# Patient Record
Sex: Female | Born: 1945 | Race: Black or African American | Hispanic: No | Smoking: Never smoker
Health system: Southern US, Community
[De-identification: ages and names within clinical notes are randomized; demographics above are authoritative.]

## PROBLEM LIST (undated history)

## (undated) DIAGNOSIS — K08109 Complete loss of teeth, unspecified cause, unspecified class: Secondary | ICD-10-CM

## (undated) DIAGNOSIS — E785 Hyperlipidemia, unspecified: Secondary | ICD-10-CM

## (undated) DIAGNOSIS — R29898 Other symptoms and signs involving the musculoskeletal system: Secondary | ICD-10-CM

## (undated) DIAGNOSIS — C50912 Malignant neoplasm of unspecified site of left female breast: Secondary | ICD-10-CM

## (undated) DIAGNOSIS — Z972 Presence of dental prosthetic device (complete) (partial): Secondary | ICD-10-CM

## (undated) DIAGNOSIS — G629 Polyneuropathy, unspecified: Secondary | ICD-10-CM

## (undated) DIAGNOSIS — E1142 Type 2 diabetes mellitus with diabetic polyneuropathy: Secondary | ICD-10-CM

## (undated) DIAGNOSIS — I10 Essential (primary) hypertension: Secondary | ICD-10-CM

## (undated) DIAGNOSIS — E119 Type 2 diabetes mellitus without complications: Secondary | ICD-10-CM

## (undated) HISTORY — DX: Polyneuropathy, unspecified: G62.9

## (undated) HISTORY — DX: Hyperlipidemia, unspecified: E78.5

## (undated) HISTORY — PX: FRACTURE SURGERY: SHX138

---

## 1969-09-27 HISTORY — PX: APPENDECTOMY: SHX54

## 2010-10-18 ENCOUNTER — Encounter: Payer: Self-pay | Admitting: Family Medicine

## 2012-02-04 ENCOUNTER — Emergency Department (INDEPENDENT_AMBULATORY_CARE_PROVIDER_SITE_OTHER)
Admission: EM | Admit: 2012-02-04 | Discharge: 2012-02-04 | Disposition: A | Discharge status: Home / Self Care | Payer: Self-pay | Source: Home / Self Care | Attending: Family Medicine | Admitting: Family Medicine

## 2012-02-04 ENCOUNTER — Encounter (HOSPITAL_COMMUNITY): Payer: Self-pay | Admitting: Emergency Medicine

## 2012-02-04 DIAGNOSIS — R739 Hyperglycemia, unspecified: Secondary | ICD-10-CM

## 2012-02-04 DIAGNOSIS — R7309 Other abnormal glucose: Secondary | ICD-10-CM

## 2012-02-04 HISTORY — DX: Essential (primary) hypertension: I10

## 2012-02-04 LAB — POCT I-STAT, CHEM 8
BUN: 16 mg/dL (ref 6–23)
Calcium, Ion: 1.19 mmol/L (ref 1.12–1.32)
TCO2: 25 mmol/L (ref 0–100)

## 2012-02-04 NOTE — ED Notes (Addendum)
Pt here with 2 mnths of intermit bilat posterior  Achy pain from thigh to lower legs.pt has hx htn and takes medication prescribed by prime care doctor but states since taking cholesterol med legs have been hurting.swelling seen in both feet.pt taking hctz.pt also reports of with standing unsteady gait due to pain needing wall to hold her up

## 2012-02-04 NOTE — ED Provider Notes (Signed)
History     CSN: [id number]  Arrival date & time 02/04/12  1308   First MD Initiated Contact with Patient 02/04/12 1018      Chief Complaint  Patient presents with  . Leg Pain    (Consider location/radiation/quality/duration/timing/severity/associated sxs/prior treatment) Patient is a 66 y.o. female presenting with leg pain. The history is provided by the patient.  Leg Pain  The incident occurred more than 1 week ago (cramping in legs since taking statin for cholesterol med for 1 mo.). The pain location is generalized. The pain is mild. Pertinent negatives include no muscle weakness. Treatments tried: has stopped med but sx continue, has had thyroid checked.    Past Medical History  Diagnosis Date  . Hypertension     History reviewed. No pertinent past surgical history.  No family history on file.  History  Substance Use Topics  . Smoking status: Never Smoker   . Smokeless tobacco: Not on file  . Alcohol Use: Yes    OB History    Grav Para Term Preterm Abortions TAB SAB Ect Mult Living                  Review of Systems  Constitutional: Negative.   Cardiovascular: Negative for leg swelling.  Musculoskeletal: Positive for gait problem. Negative for back pain and joint swelling.       No joint pain or swelling, sx are in muscles post bilat.  Hematological: Negative for adenopathy.    Allergies  Review of patient's allergies indicates no known allergies.  Home Medications   Current Outpatient Rx  Name Route Sig Dispense Refill  . LISINOPRIL-HYDROCHLOROTHIAZIDE 20-25 MG PO TABS Oral Take 1 tablet by mouth daily.    Marland Kitchen METOPROLOL SUCCINATE ER 200 MG PO TB24 Oral Take 200 mg by mouth daily.      BP 173/74  Pulse 64  Temp(Src) 98.4 F (36.9 C) (Oral)  Resp 20  SpO2 100%  Physical Exam  Nursing note and vitals reviewed. Constitutional: She is oriented to person, place, and time. She appears well-developed and well-nourished.  Musculoskeletal: Normal range  of motion. She exhibits tenderness. She exhibits no edema.       Feet:  Neurological: She is alert and oriented to person, place, and time.  Skin: Skin is warm and dry. No rash noted.    ED Course  Procedures (including critical care time)  Labs Reviewed  POCT I-STAT, CHEM 8 - Abnormal; Notable for the following:    Glucose, Bld 144 (*)    Hemoglobin 15.6 (*)    All other components within normal limits   No results found.   1. Hyperglycemia       MDM          Linna Hoff, MD 02/04/12 [phone]

## 2013-03-28 ENCOUNTER — Encounter: Payer: Self-pay | Admitting: Gastroenterology

## 2013-04-13 ENCOUNTER — Encounter: Payer: Self-pay | Admitting: Diagnostic Neuroimaging

## 2013-04-13 ENCOUNTER — Ambulatory Visit (INDEPENDENT_AMBULATORY_CARE_PROVIDER_SITE_OTHER): Payer: Medicare Other | Admitting: Diagnostic Neuroimaging

## 2013-04-13 VITALS — BP 160/70 | HR 88 | Temp 98.3°F | Ht 67.0 in | Wt 243.0 lb

## 2013-04-13 DIAGNOSIS — E1140 Type 2 diabetes mellitus with diabetic neuropathy, unspecified: Secondary | ICD-10-CM

## 2013-04-13 DIAGNOSIS — E1142 Type 2 diabetes mellitus with diabetic polyneuropathy: Secondary | ICD-10-CM

## 2013-04-13 DIAGNOSIS — M5416 Radiculopathy, lumbar region: Secondary | ICD-10-CM | POA: Insufficient documentation

## 2013-04-13 DIAGNOSIS — E538 Deficiency of other specified B group vitamins: Secondary | ICD-10-CM | POA: Insufficient documentation

## 2013-04-13 DIAGNOSIS — R269 Unspecified abnormalities of gait and mobility: Secondary | ICD-10-CM | POA: Insufficient documentation

## 2013-04-13 DIAGNOSIS — IMO0002 Reserved for concepts with insufficient information to code with codable children: Secondary | ICD-10-CM

## 2013-04-13 DIAGNOSIS — E1149 Type 2 diabetes mellitus with other diabetic neurological complication: Secondary | ICD-10-CM

## 2013-04-13 NOTE — Progress Notes (Signed)
[facility]  PATIENT: Tanya Juarez DOB: May 30, 1946  REFERRING CLINICIAN: Cloward HISTORY FROM: patient and son REASON FOR VISIT: new consult   HISTORICAL  CHIEF COMPLAINT:  Chief Complaint  Patient presents with  . Neurologic Problem    numbness    HISTORY OF PRESENT ILLNESS:   67 year old right-handed female with hypertension, diabetes, hypercholesterolemia, obesity, here for evaluation of gait and balance difficulty.  Last year, patient developed pain in the back of her thighs radiating to the bilateral feet. She also developed numbness in her toes. She had significant balance and walking difficulties. She had a significant problem getting up out of the chair. The symptoms have gradually improved.  At that time she was diagnosed with diabetes and B12 deficiency, and her numbers have been improving. Patient no longer has pain in her back of her thighs. She denies any low back pain. She's using a rolling walker with good results. She's planning to join the [facility] to get some exercise. She's planning to start physical therapy evaluation.  REVIEW OF SYSTEMS: Full 14 system review of systems performed and notable only for swelling and legs moles aching muscles anemia.  ALLERGIES: No Known Allergies  HOME MEDICATIONS: Outpatient Prescriptions Prior to Visit  Medication Sig Dispense Refill  . lisinopril-hydrochlorothiazide (PRINZIDE,ZESTORETIC) 20-25 MG per tablet Take 1 tablet by mouth daily.      . metoprolol (TOPROL-XL) 200 MG 24 hr tablet Take 100 mg by mouth daily.        No facility-administered medications prior to visit.    PAST MEDICAL HISTORY: Past Medical History  Diagnosis Date  . Hypertension     PAST SURGICAL HISTORY: Past Surgical History  Procedure Laterality Date  . Cesarean section      FAMILY HISTORY: Family History  Problem Relation Age of Onset  . Cancer Mother   . Lung cancer Father   . Hypertension      SOCIAL  HISTORY:  History   Social History  . Marital Status: Single    Spouse Name: N/A    Number of Children: 2  . Years of Education: 12th   Occupational History  . Not on file.   Social History Main Topics  . Smoking status: Never Smoker   . Smokeless tobacco: Never Used  . Alcohol Use: Yes     Comment: occasionally  . Drug Use: No  . Sexually Active: Not on file   Other Topics Concern  . Not on file   Social History Narrative   Patient lives at home alone.   Caffeine Use: 1 cup occasionally     PHYSICAL EXAM  Filed Vitals:   04/13/13 0925  BP: 160/70  Pulse: 88  Temp: 98.3 F (36.8 C)  TempSrc: Oral  Height: 5\' 7"  (1.702 m)  Weight: 243 lb (110.224 kg)    Not recorded    Body mass index is 38.05 kg/(m^2).  GENERAL EXAM: Patient is in no distress  CARDIOVASCULAR: Regular rate and rhythm, no murmurs, no carotid bruits  NEUROLOGIC: MENTAL STATUS: awake, alert, language fluent, comprehension intact, naming intact CRANIAL NERVE: no papilledema on fundoscopic exam, pupils equal and reactive to light, visual fields full to confrontation, extraocular muscles intact, no nystagmus, facial sensation and strength symmetric, uvula midline, shoulder shrug symmetric, tongue midline. MOTOR: normal bulk and tone, full strength in the BUE; BLE (R HF 4, L HF 3, KNE/KF 5, DF 5, PF 3). SENSORY: ABSENT VIB IN LEFT TOE AND ANKLE. DECR PP IN LEFT FOOT. COORDINATION:  finger-nose-finger, fine finger movements normal REFLEXES: BUE 1, KNEES TRACE, ANKLES 0. GAIT/STATION: SLOW CAUTIOUS GAIT. DIFF ARISING FROM CHAIR. CANNOT STAND UP ON TOES. ABLE TO GET ON HEELS, BUT NOT WALK. CANNOT TANDEM. ROMBERG POSITIVE.  DIAGNOSTIC DATA (LABS, IMAGING, TESTING) - I reviewed patient records, labs, notes, testing and imaging myself where available.  Lab Results  Component Value Date   HGB 15.6* 02/04/2012   HCT 46.0 02/04/2012      Component Value Date/Time   NA 140 02/04/2012 1125   K 4.1  02/04/2012 1125   CL 106 02/04/2012 1125   GLUCOSE 144* 02/04/2012 1125   BUN 16 02/04/2012 1125   CREATININE 0.90 02/04/2012 1125   No results found for this basename: CHOL, HDL, LDLCALC, LDLDIRECT, TRIG, CHOLHDL   No results found for this basename: HGBA1C   No results found for this basename: VITAMINB12   No results found for this basename: TSH    A1C - 6.4  B12 - "> 1000" per patient and son  ASSESSMENT AND PLAN  18 y.o. year old female  has a past medical history of Hypertension. here with gait and balance difficulty for over one year. Symptoms are gradually improving. 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  PLAN: 1. Since her symptoms are gradually improving, I will defer any further testing. I agree with continued management of diabetes, B12 deficiency and referral to physical therapy. 2. If symptoms get worse in the future, I would check CK, aldolase, MRI lumbar spine, and possible EMG nerve conduction study.    Suanne Marker, MD 04/13/2013, 9:52 AM Certified in Neurology, Neurophysiology and Neuroimaging  [facility] [address] [place], [place] [address] [phone]

## 2013-04-13 NOTE — Patient Instructions (Signed)
Continue with Physical therapy.

## 2013-05-30 ENCOUNTER — Other Ambulatory Visit: Payer: Self-pay | Admitting: Internal Medicine

## 2013-05-30 DIAGNOSIS — Z1231 Encounter for screening mammogram for malignant neoplasm of breast: Secondary | ICD-10-CM

## 2013-06-05 ENCOUNTER — Encounter: Payer: Self-pay | Admitting: Gastroenterology

## 2015-08-18 ENCOUNTER — Inpatient Hospital Stay (HOSPITAL_COMMUNITY)
Admission: EM | Admit: 2015-08-18 | Discharge: 2015-08-23 | DRG: 493 | Disposition: A | Discharge status: Skilled Nursing Facility | Payer: Medicare Other | Attending: Orthopaedic Surgery | Admitting: Orthopaedic Surgery

## 2015-08-18 ENCOUNTER — Emergency Department (HOSPITAL_COMMUNITY): Payer: Medicare Other

## 2015-08-18 ENCOUNTER — Encounter (HOSPITAL_COMMUNITY): Payer: Self-pay | Admitting: Emergency Medicine

## 2015-08-18 DIAGNOSIS — I10 Essential (primary) hypertension: Secondary | ICD-10-CM | POA: Diagnosis not present

## 2015-08-18 DIAGNOSIS — S82842B Displaced bimalleolar fracture of left lower leg, initial encounter for open fracture type I or II: Principal | ICD-10-CM | POA: Diagnosis present

## 2015-08-18 DIAGNOSIS — W19XXXA Unspecified fall, initial encounter: Secondary | ICD-10-CM | POA: Diagnosis present

## 2015-08-18 DIAGNOSIS — Z7982 Long term (current) use of aspirin: Secondary | ICD-10-CM

## 2015-08-18 DIAGNOSIS — Z7984 Long term (current) use of oral hypoglycemic drugs: Secondary | ICD-10-CM | POA: Diagnosis not present

## 2015-08-18 DIAGNOSIS — E119 Type 2 diabetes mellitus without complications: Secondary | ICD-10-CM | POA: Diagnosis not present

## 2015-08-18 DIAGNOSIS — D62 Acute posthemorrhagic anemia: Secondary | ICD-10-CM | POA: Diagnosis not present

## 2015-08-18 DIAGNOSIS — Z79899 Other long term (current) drug therapy: Secondary | ICD-10-CM

## 2015-08-18 DIAGNOSIS — S82899A Other fracture of unspecified lower leg, initial encounter for closed fracture: Secondary | ICD-10-CM

## 2015-08-18 DIAGNOSIS — Z8249 Family history of ischemic heart disease and other diseases of the circulatory system: Secondary | ICD-10-CM | POA: Diagnosis not present

## 2015-08-18 DIAGNOSIS — Z419 Encounter for procedure for purposes other than remedying health state, unspecified: Secondary | ICD-10-CM

## 2015-08-18 DIAGNOSIS — Z6837 Body mass index (BMI) 37.0-37.9, adult: Secondary | ICD-10-CM

## 2015-08-18 DIAGNOSIS — T1490 Injury, unspecified: Secondary | ICD-10-CM | POA: Diagnosis not present

## 2015-08-18 DIAGNOSIS — T1490XA Injury, unspecified, initial encounter: Secondary | ICD-10-CM

## 2015-08-18 DIAGNOSIS — S82892B Other fracture of left lower leg, initial encounter for open fracture type I or II: Secondary | ICD-10-CM

## 2015-08-18 DIAGNOSIS — T148XXA Other injury of unspecified body region, initial encounter: Secondary | ICD-10-CM

## 2015-08-18 MED ORDER — TETANUS-DIPHTH-ACELL PERTUSSIS 5-2.5-18.5 LF-MCG/0.5 IM SUSP
0.5000 mL | Freq: Once | INTRAMUSCULAR | Status: AC
  Administered 2015-08-18: 0.5 mL via INTRAMUSCULAR
  Filled 2015-08-18: qty 0.5

## 2015-08-18 MED ORDER — CEFAZOLIN SODIUM 1-5 GM-% IV SOLN
1.0000 g | Freq: Once | INTRAVENOUS | Status: AC
  Administered 2015-08-18: 1 g via INTRAVENOUS
  Filled 2015-08-18: qty 50

## 2015-08-18 MED ORDER — SODIUM CHLORIDE 0.9 % IV BOLUS (SEPSIS)
1000.0000 mL | Freq: Once | INTRAVENOUS | Status: AC
Start: 2015-08-18 — End: 2015-08-19
  Administered 2015-08-18: 1000 mL via INTRAVENOUS

## 2015-08-18 NOTE — ED Notes (Signed)
Pt to xray

## 2015-08-18 NOTE — ED Notes (Signed)
Pt back from x-ray.

## 2015-08-18 NOTE — ED Notes (Signed)
Pt in EMS from home, was walking in kitchen and leg gave out, has open tib/fib on L side. Bleeding controlled, peripheral intact

## 2015-08-18 NOTE — ED Provider Notes (Signed)
CSN: [id number]     Arrival date & time 08/18/15  2226 History   First MD Initiated Contact with Patient 08/18/15 2230     Chief Complaint  Patient presents with  . Leg Injury     (Consider location/radiation/quality/duration/timing/severity/associated sxs/prior Treatment) Patient is a 69 y.o. female presenting with ankle pain.  Ankle Pain Location:  Ankle Injury: yes   Ankle location:  L ankle Pain details:    Quality:  Aching   Radiates to:  Does not radiate   Severity:  No pain   Onset quality:  Sudden   Duration:  3 hours   Timing:  Constant Associated symptoms: no back pain and no fever     Past Medical History  Diagnosis Date  . Hypertension   . Diabetes mellitus without complication [facility])    Past Surgical History  Procedure Laterality Date  . Cesarean section     Family History  Problem Relation Age of Onset  . Cancer Mother   . Lung cancer Father   . Hypertension     Social History  Substance Use Topics  . Smoking status: Never Smoker   . Smokeless tobacco: Never Used  . Alcohol Use: Yes     Comment: occasionally   OB History    No data available     Review of Systems  Constitutional: Negative for fever and chills.  Eyes: Negative for pain and redness.  Respiratory: Negative for cough and stridor.   Cardiovascular: Negative for chest pain and palpitations.  Gastrointestinal: Negative for abdominal pain.  Endocrine: Negative for polydipsia and polyuria.  Genitourinary: Negative for flank pain.  Musculoskeletal: Positive for gait problem (left ankle pain and deformity). Negative for myalgias and back pain.  All other systems reviewed and are negative.     Allergies  Review of patient's allergies indicates no known allergies.  Home Medications   Prior to Admission medications   Medication Sig Start Date End Date Taking? Authorizing Provider  aspirin 81 MG tablet Take 81 mg by mouth daily.   Yes Historical Provider, MD  Cholecalciferol  (VITAMIN D3) 1000 UNITS CAPS Take 1,000 Units by mouth daily.    Yes Historical Provider, MD  cloNIDine (CATAPRES) 0.2 MG tablet Take 0.2 mg by mouth 2 (two) times daily.   Yes Historical Provider, MD  lisinopril-hydrochlorothiazide (PRINZIDE,ZESTORETIC) 20-25 MG per tablet Take 1 tablet by mouth daily.   Yes Historical Provider, MD  metFORMIN (GLUCOPHAGE-XR) 500 MG 24 hr tablet Take 1 tablet by mouth daily. 03/23/13  Yes Historical Provider, MD  pravastatin (PRAVACHOL) 20 MG tablet Take 1 tablet by mouth daily. 03/23/13  Yes Historical Provider, MD  vitamin B-12 (CYANOCOBALAMIN) 1000 MCG tablet Take 1,000 mcg by mouth daily.   Yes Historical Provider, MD   BP 151/82 mmHg  Pulse 71  Temp(Src) 97.8 F (36.6 C) (Oral)  Resp 27  Ht 5\' 7"  (1.702 m)  Wt 240 lb (108.863 kg)  BMI 37.58 kg/m2  SpO2 95% Physical Exam  Constitutional: She is oriented to person, place, and time. She appears well-developed and well-nourished.  HENT:  Head: Normocephalic and atraumatic.  Eyes: Conjunctivae and EOM are normal. Pupils are equal, round, and reactive to light.  Neck: Normal range of motion.  Cardiovascular: Normal rate and regular rhythm.   Pulmonary/Chest: Effort normal and breath sounds normal. No stridor. No respiratory distress.  Abdominal: Soft. She exhibits no distension. There is no tenderness.  Musculoskeletal: Normal range of motion. She exhibits tenderness (slightly to left ankle).  She exhibits no edema.  1.5 cm laceration to medial side of obviously deformed left ankle with a pulse initially, disappeared during ED stay. Delayed cap refill as well after pulse disappeared.   Neurological: She is alert and oriented to person, place, and time.  Skin: Skin is warm and dry.  Nursing note and vitals reviewed.   ED Course  Reduction of fracture Date/Time: 08/19/2015 12:30 AM Performed by: Merrily Pew Authorized by: Merrily Pew Consent: Verbal consent obtained. Risks and benefits: risks,  benefits and alternatives were discussed Consent given by: patient Local anesthesia used: no Patient sedated: no Patient tolerance: Patient tolerated the procedure well with no immediate complications Comments: Attempted reduction 2/2 pulseless left foot with delayed capillary refill and was successful in anatomic alignment but still pulseless. Neurologically intact.    (including critical care time)  CRITICAL CARE Performed by: Merrily Pew   Total critical care time: 35 minutes  Critical care time was exclusive of separately billable procedures and treating other patients.  Critical care was necessary to treat or prevent imminent or life-threatening deterioration.  Critical care was time spent personally by me on the following activities: development of treatment plan with patient and/or surrogate as well as nursing, discussions with consultants, evaluation of patient's response to treatment, examination of patient, obtaining history from patient or surrogate, ordering and performing treatments and interventions, ordering and review of laboratory studies, ordering and review of radiographic studies, pulse oximetry and re-evaluation of patient's condition.   Labs Review Labs Reviewed  CBC WITH DIFFERENTIAL/PLATELET - Abnormal; Notable for the following:    WBC 18.9 (*)    Neutro Abs 16.1 (*)    Monocytes Absolute 1.2 (*)    All other components within normal limits  COMPREHENSIVE METABOLIC PANEL  PROTIME-INR    Imaging Review Dg Ankle Complete Left  08/18/2015  CLINICAL DATA:  Fall with ankle fracture. EXAM: LEFT ANKLE COMPLETE - 3+ VIEW COMPARISON:  None. FINDINGS: Oblique fracture left lateral malleolus. Transverse fracture of the medial malleolar tip. Abnormal widening of the mortise if, which measures up to 10 mm craniocaudad medially and up to 12 mm between the medial malleolus and the talus. The shaft of the fibula and the shaft of the tibia are both displaced medially. I  do not see a distinct fracture of the posterior malleolus. Vascular calcifications noted.  Large plantar calcaneal spur. Severe soft tissue swelling noted. Spurring in the midfoot is present. IMPRESSION: 1. Prominent Weber B fracture with oblique lateral malleolar component, transverse medial malleolar tip component, and medial displacement of the tibia and fibular shaft with respect to the talus. 2. Midfoot degenerative spurring. 3. Considerable vascular calcifications. 4. Considerable soft tissue swelling. Electronically Signed   By: Van Clines M.D.   On: 08/18/2015 23:34   Dg Knee Complete 4 Views Left  08/18/2015  CLINICAL DATA:  Left knee bruising after crawling to another room. Initial encounter. EXAM: LEFT KNEE - COMPLETE 4+ VIEW COMPARISON:  None. FINDINGS: There is no evidence of fracture or dislocation. The joint spaces are preserved. No significant degenerative change is seen; the patellofemoral joint is grossly unremarkable in appearance. No significant joint effusion is seen. Diffuse vascular calcifications are seen. IMPRESSION: 1. No evidence of fracture or dislocation. 2. Diffuse vascular calcifications seen. Electronically Signed   By: Garald Balding M.D.   On: 08/18/2015 23:33   I have personally reviewed and evaluated these images and lab results as part of my medical decision-making.   EKG Interpretation None  MDM   Final diagnoses:  Ankle fracture  Open left ankle fracture, type I or II, initial encounter   69 year old female with a left ankle fracture from standing. It also became open when she tried standing up to let her son in the door. On arrival here she she had a pulse in her left foot but was diminished. Sometime during her stay she lost her pulse and her foot had delayed cap Refill. I attempted manipulation of the joint without any improvement. She also had a laceration to the medial side of her left ankle that was hemostatic. Ancef and tDAP given.  Discussed case with orthopedics and they recommended a CT angiogram as a vascular injury is unlikely with this injury. Will come to see and post for OR.      Merrily Pew, MD 08/21/15 [phone]

## 2015-08-19 ENCOUNTER — Emergency Department (HOSPITAL_COMMUNITY): Payer: Medicare Other | Admitting: Anesthesiology

## 2015-08-19 ENCOUNTER — Emergency Department (HOSPITAL_COMMUNITY): Payer: Medicare Other

## 2015-08-19 ENCOUNTER — Encounter (HOSPITAL_COMMUNITY): Payer: Self-pay

## 2015-08-19 ENCOUNTER — Encounter (HOSPITAL_COMMUNITY)
Admission: EM | Disposition: A | Discharge status: Skilled Nursing Facility | Payer: Self-pay | Source: Home / Self Care | Attending: Orthopaedic Surgery

## 2015-08-19 DIAGNOSIS — S82892B Other fracture of left lower leg, initial encounter for open fracture type I or II: Secondary | ICD-10-CM

## 2015-08-19 DIAGNOSIS — Z7982 Long term (current) use of aspirin: Secondary | ICD-10-CM | POA: Diagnosis not present

## 2015-08-19 DIAGNOSIS — W19XXXA Unspecified fall, initial encounter: Secondary | ICD-10-CM | POA: Diagnosis present

## 2015-08-19 DIAGNOSIS — T148XXA Other injury of unspecified body region, initial encounter: Secondary | ICD-10-CM

## 2015-08-19 DIAGNOSIS — Z8249 Family history of ischemic heart disease and other diseases of the circulatory system: Secondary | ICD-10-CM | POA: Diagnosis not present

## 2015-08-19 DIAGNOSIS — Z7984 Long term (current) use of oral hypoglycemic drugs: Secondary | ICD-10-CM | POA: Diagnosis not present

## 2015-08-19 DIAGNOSIS — I10 Essential (primary) hypertension: Secondary | ICD-10-CM | POA: Diagnosis present

## 2015-08-19 DIAGNOSIS — Z6837 Body mass index (BMI) 37.0-37.9, adult: Secondary | ICD-10-CM | POA: Diagnosis not present

## 2015-08-19 DIAGNOSIS — S82842B Displaced bimalleolar fracture of left lower leg, initial encounter for open fracture type I or II: Secondary | ICD-10-CM | POA: Diagnosis present

## 2015-08-19 DIAGNOSIS — E119 Type 2 diabetes mellitus without complications: Secondary | ICD-10-CM | POA: Diagnosis present

## 2015-08-19 DIAGNOSIS — Z79899 Other long term (current) drug therapy: Secondary | ICD-10-CM | POA: Diagnosis not present

## 2015-08-19 DIAGNOSIS — T1490 Injury, unspecified: Secondary | ICD-10-CM | POA: Diagnosis present

## 2015-08-19 DIAGNOSIS — D62 Acute posthemorrhagic anemia: Secondary | ICD-10-CM | POA: Diagnosis not present

## 2015-08-19 HISTORY — PX: I&D EXTREMITY: SHX5045

## 2015-08-19 LAB — CBC WITH DIFFERENTIAL/PLATELET
Basophils Absolute: 0 10*3/uL (ref 0.0–0.1)
Basophils Relative: 0 %
Eosinophils Absolute: 0 10*3/uL (ref 0.0–0.7)
Eosinophils Relative: 0 %
HEMATOCRIT: 38.7 % (ref 36.0–46.0)
HEMOGLOBIN: 13.1 g/dL (ref 12.0–15.0)
LYMPHS ABS: 1.5 10*3/uL (ref 0.7–4.0)
LYMPHS PCT: 8 %
MCH: 31.6 pg (ref 26.0–34.0)
MCHC: 33.9 g/dL (ref 30.0–36.0)
MCV: 93.5 fL (ref 78.0–100.0)
MONO ABS: 1.2 10*3/uL — AB (ref 0.1–1.0)
MONOS PCT: 6 %
NEUTROS ABS: 16.1 10*3/uL — AB (ref 1.7–7.7)
NEUTROS PCT: 86 %
Platelets: 289 10*3/uL (ref 150–400)
RBC: 4.14 MIL/uL (ref 3.87–5.11)
RDW: 13.5 % (ref 11.5–15.5)
WBC: 18.9 10*3/uL — ABNORMAL HIGH (ref 4.0–10.5)

## 2015-08-19 LAB — COMPREHENSIVE METABOLIC PANEL
ALBUMIN: 3.3 g/dL — AB (ref 3.5–5.0)
ALK PHOS: 42 U/L (ref 38–126)
ALT: 30 U/L (ref 14–54)
AST: 39 U/L (ref 15–41)
Anion gap: 12 (ref 5–15)
BILIRUBIN TOTAL: 1.1 mg/dL (ref 0.3–1.2)
BUN: 10 mg/dL (ref 6–20)
CHLORIDE: 96 mmol/L — AB (ref 101–111)
CO2: 19 mmol/L — AB (ref 22–32)
CREATININE: 0.9 mg/dL (ref 0.44–1.00)
Calcium: 8.7 mg/dL — ABNORMAL LOW (ref 8.9–10.3)
GFR calc Af Amer: >60 mL/min (ref >60)
Glucose, Bld: 130 mg/dL — ABNORMAL HIGH (ref 65–99)
POTASSIUM: 4.3 mmol/L (ref 3.5–5.1)
Sodium: 127 mmol/L — ABNORMAL LOW (ref 135–145)
Total Protein: 7.2 g/dL (ref 6.5–8.1)

## 2015-08-19 LAB — GLUCOSE, CAPILLARY
GLUCOSE-CAPILLARY: 109 mg/dL — AB (ref 65–99)
GLUCOSE-CAPILLARY: 136 mg/dL — AB (ref 65–99)
Glucose-Capillary: 105 mg/dL — ABNORMAL HIGH (ref 65–99)
Glucose-Capillary: 107 mg/dL — ABNORMAL HIGH (ref 65–99)
Glucose-Capillary: 106 mg/dL — ABNORMAL HIGH (ref 65–99)
Glucose-Capillary: 127 mg/dL — ABNORMAL HIGH (ref 65–99)

## 2015-08-19 LAB — PROTIME-INR
INR: 1.05 (ref 0.00–1.49)
Prothrombin Time: 13.9 seconds (ref 11.6–15.2)

## 2015-08-19 SURGERY — IRRIGATION AND DEBRIDEMENT EXTREMITY
Anesthesia: General | Site: Ankle | Laterality: Left

## 2015-08-19 MED ORDER — FENTANYL CITRATE (PF) 250 MCG/5ML IJ SOLN
INTRAMUSCULAR | Status: AC
Start: 2015-08-19 — End: 2015-08-19
  Filled 2015-08-19: qty 5

## 2015-08-19 MED ORDER — OXYCODONE HCL 5 MG PO TABS
5.0000 mg | ORAL_TABLET | ORAL | Status: DC | PRN
  Administered 2015-08-19 – 2015-08-22 (×7): 10 mg via ORAL
  Filled 2015-08-19 (×7): qty 2

## 2015-08-19 MED ORDER — HYDROCODONE-ACETAMINOPHEN 7.5-325 MG PO TABS: 1.0000 | ORAL_TABLET | Freq: Once | ORAL | Status: DC | PRN

## 2015-08-19 MED ORDER — PRAVASTATIN SODIUM 20 MG PO TABS
20.0000 mg | ORAL_TABLET | Freq: Every day | ORAL | Status: DC
  Administered 2015-08-19 – 2015-08-23 (×4): 20 mg via ORAL
  Filled 2015-08-19 (×5): qty 1

## 2015-08-19 MED ORDER — CLONIDINE HCL 0.2 MG PO TABS
0.2000 mg | ORAL_TABLET | Freq: Two times a day (BID) | ORAL | Status: DC
  Administered 2015-08-19 – 2015-08-23 (×8): 0.2 mg via ORAL
  Filled 2015-08-19 (×9): qty 1

## 2015-08-19 MED ORDER — CEFAZOLIN SODIUM-DEXTROSE 2-3 GM-% IV SOLR
2.0000 g | Freq: Four times a day (QID) | INTRAVENOUS | Status: AC
  Administered 2015-08-19 (×3): 2 g via INTRAVENOUS
  Filled 2015-08-19 (×4): qty 50

## 2015-08-19 MED ORDER — LIDOCAINE HCL (CARDIAC) 20 MG/ML IV SOLN
INTRAVENOUS | Status: AC
  Filled 2015-08-19: qty 5

## 2015-08-19 MED ORDER — METOCLOPRAMIDE HCL 5 MG/ML IJ SOLN: 5.0000 mg | Freq: Three times a day (TID) | INTRAMUSCULAR | Status: DC | PRN

## 2015-08-19 MED ORDER — HYDROMORPHONE HCL 1 MG/ML IJ SOLN: 0.2500 mg | INTRAMUSCULAR | Status: DC | PRN

## 2015-08-19 MED ORDER — SUCCINYLCHOLINE CHLORIDE 20 MG/ML IJ SOLN
INTRAMUSCULAR | Status: AC
  Filled 2015-08-19: qty 1

## 2015-08-19 MED ORDER — ONDANSETRON HCL 4 MG PO TABS: 4.0000 mg | ORAL_TABLET | Freq: Four times a day (QID) | ORAL | Status: DC | PRN

## 2015-08-19 MED ORDER — ACETAMINOPHEN 650 MG RE SUPP: 650.0000 mg | Freq: Four times a day (QID) | RECTAL | Status: DC | PRN

## 2015-08-19 MED ORDER — IOHEXOL 350 MG/ML SOLN
100.0000 mL | Freq: Once | INTRAVENOUS | Status: AC | PRN
Start: 2015-08-19 — End: 2015-08-19
  Administered 2015-08-19: 100 mL via INTRAVENOUS

## 2015-08-19 MED ORDER — INSULIN ASPART 100 UNIT/ML ~~LOC~~ SOLN
0.0000 [IU] | Freq: Three times a day (TID) | SUBCUTANEOUS | Status: DC
  Administered 2015-08-19: 2 [IU] via SUBCUTANEOUS

## 2015-08-19 MED ORDER — CEFAZOLIN SODIUM-DEXTROSE 2-3 GM-% IV SOLR
2.0000 g | Freq: Once | INTRAVENOUS | Status: AC
  Administered 2015-08-20: 2 g via INTRAVENOUS
  Filled 2015-08-19: qty 50

## 2015-08-19 MED ORDER — METOCLOPRAMIDE HCL 5 MG PO TABS: 5.0000 mg | ORAL_TABLET | Freq: Three times a day (TID) | ORAL | Status: DC | PRN

## 2015-08-19 MED ORDER — LIDOCAINE HCL (CARDIAC) 20 MG/ML IV SOLN
INTRAVENOUS | Status: DC | PRN
  Administered 2015-08-19: 60 mg via INTRAVENOUS

## 2015-08-19 MED ORDER — SODIUM CHLORIDE 0.9 % IV SOLN
INTRAVENOUS | Status: DC | PRN
  Administered 2015-08-19 (×2): via INTRAVENOUS

## 2015-08-19 MED ORDER — INSULIN ASPART 100 UNIT/ML ~~LOC~~ SOLN: 0.0000 [IU] | Freq: Every day | SUBCUTANEOUS | Status: DC

## 2015-08-19 MED ORDER — PROPOFOL 10 MG/ML IV BOLUS
INTRAVENOUS | Status: AC
  Filled 2015-08-19: qty 20

## 2015-08-19 MED ORDER — PHENYLEPHRINE HCL 10 MG/ML IJ SOLN
INTRAMUSCULAR | Status: DC | PRN
  Administered 2015-08-19 (×2): 80 ug via INTRAVENOUS

## 2015-08-19 MED ORDER — FENTANYL CITRATE (PF) 250 MCG/5ML IJ SOLN
INTRAMUSCULAR | Status: DC | PRN
  Administered 2015-08-19 (×2): 50 ug via INTRAVENOUS

## 2015-08-19 MED ORDER — ONDANSETRON HCL 4 MG/2ML IJ SOLN
4.0000 mg | Freq: Four times a day (QID) | INTRAMUSCULAR | Status: DC | PRN
  Administered 2015-08-20: 4 mg via INTRAVENOUS

## 2015-08-19 MED ORDER — CEFAZOLIN SODIUM-DEXTROSE 2-3 GM-% IV SOLR
INTRAVENOUS | Status: DC | PRN
Start: 2015-08-19 — End: 2015-08-19
  Administered 2015-08-19: 2 g via INTRAVENOUS

## 2015-08-19 MED ORDER — HYDROCHLOROTHIAZIDE 25 MG PO TABS
25.0000 mg | ORAL_TABLET | Freq: Every day | ORAL | Status: DC
  Administered 2015-08-19 – 2015-08-23 (×4): 25 mg via ORAL
  Filled 2015-08-19 (×5): qty 1

## 2015-08-19 MED ORDER — SUCCINYLCHOLINE CHLORIDE 20 MG/ML IJ SOLN
INTRAMUSCULAR | Status: DC | PRN
  Administered 2015-08-19: 100 mg via INTRAVENOUS

## 2015-08-19 MED ORDER — LISINOPRIL 20 MG PO TABS
20.0000 mg | ORAL_TABLET | Freq: Every day | ORAL | Status: DC
  Administered 2015-08-19 – 2015-08-23 (×4): 20 mg via ORAL
  Filled 2015-08-19 (×5): qty 1

## 2015-08-19 MED ORDER — MIDAZOLAM HCL 2 MG/2ML IJ SOLN
INTRAMUSCULAR | Status: AC
  Filled 2015-08-19: qty 2

## 2015-08-19 MED ORDER — PROMETHAZINE HCL 25 MG/ML IJ SOLN: 6.2500 mg | INTRAMUSCULAR | Status: DC | PRN

## 2015-08-19 MED ORDER — SODIUM CHLORIDE 0.9 % IV SOLN
INTRAVENOUS | Status: DC
  Administered 2015-08-19 – 2015-08-20 (×2): via INTRAVENOUS

## 2015-08-19 MED ORDER — LISINOPRIL-HYDROCHLOROTHIAZIDE 20-25 MG PO TABS: 1.0000 | ORAL_TABLET | Freq: Every day | ORAL | Status: DC

## 2015-08-19 MED ORDER — DIPHENHYDRAMINE HCL 12.5 MG/5ML PO ELIX: 25.0000 mg | ORAL_SOLUTION | ORAL | Status: DC | PRN

## 2015-08-19 MED ORDER — PROPOFOL 10 MG/ML IV BOLUS
INTRAVENOUS | Status: DC | PRN
  Administered 2015-08-19: 150 mg via INTRAVENOUS

## 2015-08-19 MED ORDER — ACETAMINOPHEN 325 MG PO TABS: 650.0000 mg | ORAL_TABLET | Freq: Four times a day (QID) | ORAL | Status: DC | PRN

## 2015-08-19 SURGICAL SUPPLY — 42 items
BANDAGE ELASTIC 6 VELCRO ST LF (GAUZE/BANDAGES/DRESSINGS) ×3 IMPLANT
BLADE SURG 10 STRL SS (BLADE) ×3 IMPLANT
BNDG GAUZE ELAST 4 BULKY (GAUZE/BANDAGES/DRESSINGS) ×3 IMPLANT
CORDS BIPOLAR (ELECTRODE) IMPLANT
COVER SURGICAL LIGHT HANDLE (MISCELLANEOUS) ×3 IMPLANT
CUFF TOURNIQUET SINGLE 24IN (TOURNIQUET CUFF) IMPLANT
CUFF TOURNIQUET SINGLE 34IN LL (TOURNIQUET CUFF) ×6 IMPLANT
CUFF TOURNIQUET SINGLE 44IN (TOURNIQUET CUFF) IMPLANT
DRAPE IMP U-DRAPE 54X76 (DRAPES) IMPLANT
DRAPE SURG 17X23 STRL (DRAPES) IMPLANT
DRAPE U-SHAPE 47X51 STRL (DRAPES) ×3 IMPLANT
ELECT CAUTERY BLADE 6.4 (BLADE) ×3 IMPLANT
ELECT REM PT RETURN 9FT ADLT (ELECTROSURGICAL)
ELECTRODE REM PT RTRN 9FT ADLT (ELECTROSURGICAL) IMPLANT
FACESHIELD WRAPAROUND (MASK) ×3 IMPLANT
FACESHIELD WRAPAROUND OR TEAM (MASK) IMPLANT
GAUZE SPONGE 4X4 12PLY STRL (GAUZE/BANDAGES/DRESSINGS) ×6 IMPLANT
GAUZE XEROFORM 1X8 LF (GAUZE/BANDAGES/DRESSINGS) ×3 IMPLANT
GLOVE NEODERM STRL 7.5 LF PF (GLOVE) ×1 IMPLANT
GLOVE SURG NEODERM 7.5  LF PF (GLOVE) ×2
GOWN STRL REIN XL XLG (GOWN DISPOSABLE) ×3 IMPLANT
KIT BASIN OR (CUSTOM PROCEDURE TRAY) ×3 IMPLANT
KIT ROOM TURNOVER OR (KITS) ×3 IMPLANT
MANIFOLD NEPTUNE II (INSTRUMENTS) ×3 IMPLANT
NS IRRIG 1000ML POUR BTL (IV SOLUTION) ×6 IMPLANT
PACK ORTHO EXTREMITY (CUSTOM PROCEDURE TRAY) ×3 IMPLANT
PAD ABD 8X10 STRL (GAUZE/BANDAGES/DRESSINGS) ×3 IMPLANT
PAD ARMBOARD 7.5X6 YLW CONV (MISCELLANEOUS) ×6 IMPLANT
PADDING CAST ABS 4INX4YD NS (CAST SUPPLIES) ×4
PADDING CAST ABS COTTON 4X4 ST (CAST SUPPLIES) ×2 IMPLANT
PADDING CAST COTTON 6X4 STRL (CAST SUPPLIES) ×3 IMPLANT
SPLINT FIBERGLASS 4X30 (CAST SUPPLIES) ×2 IMPLANT
SPONGE GAUZE 4X4 12PLY STER LF (GAUZE/BANDAGES/DRESSINGS) ×2 IMPLANT
SPONGE LAP 18X18 X RAY DECT (DISPOSABLE) ×6 IMPLANT
SUT ETHILON 3 0 PS 1 (SUTURE) ×3 IMPLANT
TOWEL OR 17X26 10 PK STRL BLUE (TOWEL DISPOSABLE) ×3 IMPLANT
TUBE ANAEROBIC SPECIMEN COL (MISCELLANEOUS) IMPLANT
TUBE CONNECTING 12'X1/4 (SUCTIONS) ×1
TUBE CONNECTING 12X1/4 (SUCTIONS) ×2 IMPLANT
TUBING CYSTO DISP (UROLOGICAL SUPPLIES) ×3 IMPLANT
UNDERPAD 30X30 INCONTINENT (UNDERPADS AND DIAPERS) ×6 IMPLANT
YANKAUER SUCT BULB TIP NO VENT (SUCTIONS) ×3 IMPLANT

## 2015-08-19 NOTE — Transfer of Care (Signed)
Immediate Anesthesia Transfer of Care Note  Patient: Tanya Juarez  Procedure(s) Performed: Procedure(s): IRRIGATION AND DEBRIDEMENT LEFT ANKLE FRACTURE (Left)  Patient Location: PACU  Anesthesia Type:General  Level of Consciousness: awake, alert  and oriented  Airway & Oxygen Therapy: Patient connected to nasal cannula oxygen  Post-op Assessment: Report given to RN and Post -op Vital signs reviewed and stable  Post vital signs: Reviewed and stable  Last Vitals:  Filed Vitals:   08/19/15 0357 08/19/15 0420  BP: 163/70 145/71  Pulse: 84 80  Temp: 37 C 36.8 C  Resp: 12 16    Complications: No apparent anesthesia complications

## 2015-08-19 NOTE — Transfer of Care (Signed)
Immediate Anesthesia Transfer of Care Note  Patient: Tanya Juarez  Procedure(s) Performed: Procedure(s): IRRIGATION AND DEBRIDEMENT LEFT ANKLE FRACTURE (Left)  Patient Location: PACU  Anesthesia Type:General  Level of Consciousness: awake and alert   Airway & Oxygen Therapy: Patient Spontanous Breathing  Post-op Assessment: Report given to RN  Post vital signs: Reviewed  Last Vitals:  Filed Vitals:   08/19/15 0357 08/19/15 0420  BP: 163/70 145/71  Pulse: 84 80  Temp: 37 C 36.8 C  Resp: 12 16    Complications: No apparent anesthesia complications

## 2015-08-19 NOTE — Op Note (Signed)
   Date of Surgery: 08/19/2015  INDICATIONS: Tanya Juarez is a 69 y.o.-year-old female with a left open fracture dislocation of her left ankle;  The patient did consent to the procedure after discussion of the risks and benefits.  PREOPERATIVE DIAGNOSIS: Left open ankle fracture dislocation  POSTOPERATIVE DIAGNOSIS: Same.  PROCEDURE:  1. Debridement of bone, muscle, skin associated with open fracture, left ankle. 2. Complex repair wound repair 2 cm, left ankle.  SURGEON: N. Eduard Roux, M.D.  ASSIST: none.  ANESTHESIA:  general  IV FLUIDS AND URINE: See anesthesia.  ESTIMATED BLOOD LOSS: minimal mL.  IMPLANTS: none  DRAINS: none  COMPLICATIONS: None.  DESCRIPTION OF PROCEDURE: The patient was brought to the operating room and placed supine on the operating table.  The patient had been signed prior to the procedure and this was documented. The patient had the anesthesia placed by the anesthesiologist.  A time-out was performed to confirm that this was the correct patient, site, side and location. The patient did receive antibiotics prior to the incision and was re-dosed during the procedure as needed at indicated intervals.  A tourniquet not placed.  The patient had the operative extremity prepped and draped in the standard surgical fashion.    Sharp excisional debridement of the skin, muscle, bone was performed with a knife and rongeur.  The bony ends were thoroughly irrigated along with the wound.  There was no gross contamination.  The skin was loosely approximated with 3.0 nylon sutures.  Sterile dressings were applied.  The ankle was immobilized in a short leg splint.  Patient tolerated the procedure well and was extubated and transferred to the PACU in stable condition.  POSTOPERATIVE PLAN: She will be brought to the OR for definitive ORIF Wednesday.  Azucena Cecil, MD [place] 3:31 AM

## 2015-08-19 NOTE — Addendum Note (Signed)
Addendum  created 08/19/15 0843 by Valetta Fuller, CRNA   Modules edited: Anesthesia Events, Charges VN, Narrator, Notes Section   Narrator:  Narrator: Event Log Edited   Notes Section:  File: [id number]; Pend: [id number]

## 2015-08-19 NOTE — Progress Notes (Signed)
Utilization review completed.  

## 2015-08-19 NOTE — Progress Notes (Signed)
   Subjective:  Patient reports pain as mild.    Objective:   VITALS:   Filed Vitals:   08/19/15 0345 08/19/15 0355 08/19/15 0357 08/19/15 0420  BP: 136/62 142/71 163/70 145/71  Pulse: 89 81 84 80  Temp:   98.6 F (37 C) 98.3 F (36.8 C)  TempSrc:    Oral  Resp: 15 15 12 16   Height:      Weight:      SpO2: 98% 99% 99% 97%    Neurologically intact ABD soft Neurovascular intact Sensation intact distally Intact pulses distally Dorsiflexion/Plantar flexion intact Incision: dressing C/D/I and no drainage No cellulitis present Compartment soft   Lab Results  Component Value Date   WBC 18.9* 08/18/2015   HGB 13.1 08/18/2015   HCT 38.7 08/18/2015   MCV 93.5 08/18/2015   PLT 289 08/18/2015     Assessment/Plan:  Day of Surgery   - DVT ppx - SCDs - NWB operative extremity, elevate - significant swelling of ankle, may need to delay ORIF until Friday, will reassess later today - Pain control - anticipate needing SNF postop  Marianna Payment 08/19/2015, 8:24 AM [phone]

## 2015-08-19 NOTE — Anesthesia Postprocedure Evaluation (Signed)
Anesthesia Post Note  Patient: Tanya Juarez  Procedure(s) Performed: Procedure(s) (LRB): IRRIGATION AND DEBRIDEMENT LEFT ANKLE FRACTURE (Left)  Patient location during evaluation: PACU Anesthesia Type: General Level of consciousness: awake and alert Pain management: pain level controlled Vital Signs Assessment: post-procedure vital signs reviewed and stable Respiratory status: spontaneous breathing Cardiovascular status: blood pressure returned to baseline Postop Assessment: No signs of nausea or vomiting Anesthetic complications: no    Last Vitals:  Filed Vitals:   08/19/15 0357 08/19/15 0420  BP: 163/70 145/71  Pulse: 84 80  Temp: 37 C 36.8 C  Resp: 12 16    Last Pain:  Filed Vitals:   08/19/15 0432  PainSc: 0-No pain                 Tiajuana Amass

## 2015-08-19 NOTE — ED Notes (Signed)
This RN gave report to CRNA in [place] holding room 36.

## 2015-08-19 NOTE — Anesthesia Preprocedure Evaluation (Addendum)
Anesthesia Evaluation  Patient identified by MRN, date of birth, ID band Patient awake    Reviewed: Allergy & Precautions, NPO status , Patient's Chart, lab work & pertinent test results  Airway Mallampati: I  TM Distance: >3 FB Neck ROM: Full    Dental   Pulmonary neg pulmonary ROS,    breath sounds clear to auscultation       Cardiovascular hypertension, Pt. on medications  Rhythm:Regular Rate:Normal     Neuro/Psych negative neurological ROS     GI/Hepatic negative GI ROS, Neg liver ROS,   Endo/Other  diabetes, Type 2Morbid obesity  Renal/GU negative Renal ROS     Musculoskeletal   Abdominal   Peds  Hematology negative hematology ROS (+)   Anesthesia Other Findings   Reproductive/Obstetrics                            Anesthesia Physical Anesthesia Plan  ASA: II and emergent  Anesthesia Plan: General   Post-op Pain Management:    Induction: Intravenous  Airway Management Planned: Oral ETT  Additional Equipment:   Intra-op Plan:   Post-operative Plan: Extubation in OR  Informed Consent: I have reviewed the patients History and Physical, chart, labs and discussed the procedure including the risks, benefits and alternatives for the proposed anesthesia with the patient or authorized representative who has indicated his/her understanding and acceptance.   Dental advisory given  Plan Discussed with: CRNA  Anesthesia Plan Comments:        Anesthesia Quick Evaluation

## 2015-08-19 NOTE — H&P (Signed)
ORTHOPAEDIC HISTORY AND PHYSICAL   Chief Complaint: Left ankle injury  HPI: Tanya Juarez is a 69 y.o. female who complains of left ankle injury s/p mechanical fall.  Denies LOC.  Pain is severe in the left ankle, does not radiate, worse with weight bearing, pain is constant, throbbing.  Ortho consulted in the ER.  Walks with walker baseline.  Lives independently at home.    Past Medical History  Diagnosis Date  . Hypertension   . Diabetes mellitus without complication [facility])    Past Surgical History  Procedure Laterality Date  . Cesarean section     Social History   Social History  . Marital Status: Single    Spouse Name: N/A  . Number of Children: 2  . Years of Education: 12th   Social History Main Topics  . Smoking status: Never Smoker   . Smokeless tobacco: Never Used  . Alcohol Use: Yes     Comment: occasionally  . Drug Use: No  . Sexual Activity: Not Asked   Other Topics Concern  . None   Social History Narrative   Patient lives at home alone.   Caffeine Use: 1 cup occasionally   Family History  Problem Relation Age of Onset  . Cancer Mother   . Lung cancer Father   . Hypertension     No Known Allergies Prior to Admission medications   Medication Sig Start Date End Date Taking? Authorizing Provider  aspirin 81 MG tablet Take 81 mg by mouth daily.   Yes Historical Provider, MD  Cholecalciferol (VITAMIN D3) 1000 UNITS CAPS Take 1,000 Units by mouth daily.    Yes Historical Provider, MD  cloNIDine (CATAPRES) 0.2 MG tablet Take 0.2 mg by mouth 2 (two) times daily.   Yes Historical Provider, MD  lisinopril-hydrochlorothiazide (PRINZIDE,ZESTORETIC) 20-25 MG per tablet Take 1 tablet by mouth daily.   Yes Historical Provider, MD  metFORMIN (GLUCOPHAGE-XR) 500 MG 24 hr tablet Take 1 tablet by mouth daily. 03/23/13  Yes Historical Provider, MD  pravastatin (PRAVACHOL) 20 MG tablet Take 1 tablet by mouth daily. 03/23/13  Yes Historical Provider, MD  vitamin B-12  (CYANOCOBALAMIN) 1000 MCG tablet Take 1,000 mcg by mouth daily.   Yes Historical Provider, MD   Dg Ankle Complete Left  08/18/2015  CLINICAL DATA:  Fall with ankle fracture. EXAM: LEFT ANKLE COMPLETE - 3+ VIEW COMPARISON:  None. FINDINGS: Oblique fracture left lateral malleolus. Transverse fracture of the medial malleolar tip. Abnormal widening of the mortise if, which measures up to 10 mm craniocaudad medially and up to 12 mm between the medial malleolus and the talus. The shaft of the fibula and the shaft of the tibia are both displaced medially. I do not see a distinct fracture of the posterior malleolus. Vascular calcifications noted.  Large plantar calcaneal spur. Severe soft tissue swelling noted. Spurring in the midfoot is present. IMPRESSION: 1. Prominent Weber B fracture with oblique lateral malleolar component, transverse medial malleolar tip component, and medial displacement of the tibia and fibular shaft with respect to the talus. 2. Midfoot degenerative spurring. 3. Considerable vascular calcifications. 4. Considerable soft tissue swelling. Electronically Signed   By: Van Clines M.D.   On: 08/18/2015 23:34   Ct Angio Low Extrem Left W/cm &/or Wo/cm  08/19/2015  CLINICAL DATA:  Status post fall in kitchen, with open left ankle fracture. Assess for vascular injury. Initial encounter. EXAM: CT ANGIOGRAPHY OF THE LEFT LOWER EXTREMITY TECHNIQUE: Multidetector CT imaging of the left lower extremity  was performed using the standard protocol during bolus administration of intravenous contrast. Multiplanar CT image reconstructions and MIPs were obtained to evaluate the vascular anatomy. CONTRAST:  113mL OMNIPAQUE IOHEXOL 350 MG/ML SOLN COMPARISON:  Left ankle radiographs performed 08/18/2015 FINDINGS: A minimally displaced distal fibular fracture is noted. No additional fractures are seen. Chronic osseous fragments at the medial malleolus reflect remote injury. The ankle mortise is grossly  unremarkable in appearance. There is no evidence of talar tilt or subluxation. There is suggestion of patent three-vessel runoff to the right foot, though this is difficult to fully assess due to the extent of diffuse calcific atherosclerotic disease. There is no evidence of vascular injury. Soft tissue injury is noted at the medial aspect of the right ankle, with an open wound. Diffuse soft tissue swelling is noted about the ankle and dorsum of the foot. The flexor and extensor tendons are grossly unremarkable. There appears to be fluid tracking along the peroneal tendons, raising concern for some degree of tenosynovitis. A small knee joint effusion is noted. Review of the MIP images confirms the above findings. IMPRESSION: 1. Minimally displaced distal fibular fracture noted. 2. Chronic osseous fragments noted at the medial malleolus, reflecting remote injury. 3. Suggestion of patent three-vessel runoff to the right foot, though this is difficult to fully assess due to the extent of diffuse calcific atherosclerotic disease. No evidence of vascular injury. 4. Soft tissue injury at the medial aspect of the right ankle, with open wound. Diffuse soft tissue swelling noted about the ankle and dorsum of the foot. 5. Fluid tracking along the peroneal tendons, raising question for some degree of tenosynovitis. 6. Small knee joint effusion noted. Electronically Signed   By: Garald Balding M.D.   On: 08/19/2015 01:46   Dg Knee Complete 4 Views Left  08/18/2015  CLINICAL DATA:  Left knee bruising after crawling to another room. Initial encounter. EXAM: LEFT KNEE - COMPLETE 4+ VIEW COMPARISON:  None. FINDINGS: There is no evidence of fracture or dislocation. The joint spaces are preserved. No significant degenerative change is seen; the patellofemoral joint is grossly unremarkable in appearance. No significant joint effusion is seen. Diffuse vascular calcifications are seen. IMPRESSION: 1. No evidence of fracture or  dislocation. 2. Diffuse vascular calcifications seen. Electronically Signed   By: Garald Balding M.D.   On: 08/18/2015 23:33   - pertinent xrays, CT, MRI studies were reviewed and independently interpreted  Positive ROS: All other systems have been reviewed and were otherwise negative with the exception of those mentioned in the HPI and as above.  Physical Exam: General: Alert, no acute distress Cardiovascular: No pedal edema Respiratory: No cyanosis, no use of accessory musculature GI: No organomegaly, abdomen is soft and non-tender Skin: No lesions in the area of chief complaint Neurologic: Sensation intact distally Psychiatric: Patient is competent for consent with normal mood and affect Lymphatic: No axillary or cervical lymphadenopathy  MUSCULOSKELETAL:  - 2 cm open transverse traumatic wound on medial ankle - foot wwp - faint pulses 2/2 PVD - sensation decreased from peripheral neuropathy  Assessment: Left bimall fx dislocation DM  Plan: - to OR tonight for I&D and staged ORIF - NPO - admit to ortho - elevate - NWB   Azucena Cecil, MD [place] 2:14 AM

## 2015-08-19 NOTE — ED Notes (Signed)
Pt to CT

## 2015-08-19 NOTE — ED Notes (Addendum)
Erlinda Hong, MD with surgery at bedside.

## 2015-08-19 NOTE — Anesthesia Procedure Notes (Signed)
Procedure Name: Intubation Date/Time: 08/19/2015 2:58 AM Performed by: Valetta Fuller Pre-anesthesia Checklist: Patient identified, Emergency Drugs available, Suction available and Patient being monitored Patient Re-evaluated:Patient Re-evaluated prior to inductionOxygen Delivery Method: Circle system utilized Preoxygenation: Pre-oxygenation with 100% oxygen Intubation Type: IV induction Laryngoscope Size: Miller and 2 Grade View: Grade I Tube type: Oral Tube size: 7.5 mm Number of attempts: 1 Airway Equipment and Method: Stylet Placement Confirmation: ETT inserted through vocal cords under direct vision,  positive ETCO2 and breath sounds checked- equal and bilateral Secured at: 20 cm Tube secured with: Tape Dental Injury: Teeth and Oropharynx as per pre-operative assessment

## 2015-08-20 ENCOUNTER — Inpatient Hospital Stay (HOSPITAL_COMMUNITY): Payer: Medicare Other | Admitting: Anesthesiology

## 2015-08-20 ENCOUNTER — Encounter (HOSPITAL_COMMUNITY): Payer: Self-pay | Admitting: Certified Registered Nurse Anesthetist

## 2015-08-20 ENCOUNTER — Encounter (HOSPITAL_COMMUNITY)
Admission: EM | Disposition: A | Discharge status: Skilled Nursing Facility | Payer: Medicare Other | Source: Home / Self Care | Attending: Orthopaedic Surgery

## 2015-08-20 ENCOUNTER — Inpatient Hospital Stay (HOSPITAL_COMMUNITY): Payer: Medicare Other

## 2015-08-20 HISTORY — PX: ORIF ANKLE FRACTURE: SHX5408

## 2015-08-20 LAB — SURGICAL PCR SCREEN
MRSA, PCR: NEGATIVE
STAPHYLOCOCCUS AUREUS: NEGATIVE

## 2015-08-20 LAB — GLUCOSE, CAPILLARY
GLUCOSE-CAPILLARY: 114 mg/dL — AB (ref 65–99)
GLUCOSE-CAPILLARY: 116 mg/dL — AB (ref 65–99)
Glucose-Capillary: 89 mg/dL (ref 65–99)
Glucose-Capillary: 125 mg/dL — ABNORMAL HIGH (ref 65–99)

## 2015-08-20 SURGERY — OPEN REDUCTION INTERNAL FIXATION (ORIF) ANKLE FRACTURE
Anesthesia: Regional | Site: Ankle | Laterality: Left

## 2015-08-20 MED ORDER — FENTANYL CITRATE (PF) 250 MCG/5ML IJ SOLN
INTRAMUSCULAR | Status: AC
  Filled 2015-08-20: qty 5

## 2015-08-20 MED ORDER — ONDANSETRON HCL 4 MG/2ML IJ SOLN
INTRAMUSCULAR | Status: AC
  Filled 2015-08-20: qty 2

## 2015-08-20 MED ORDER — PHENYLEPHRINE HCL 10 MG/ML IJ SOLN
INTRAMUSCULAR | Status: DC | PRN
  Administered 2015-08-20 (×2): 80 ug via INTRAVENOUS

## 2015-08-20 MED ORDER — PROPOFOL 500 MG/50ML IV EMUL
INTRAVENOUS | Status: DC | PRN
  Administered 2015-08-20: 55 ug/kg/min via INTRAVENOUS

## 2015-08-20 MED ORDER — CEFAZOLIN SODIUM-DEXTROSE 2-3 GM-% IV SOLR
2.0000 g | Freq: Four times a day (QID) | INTRAVENOUS | Status: AC
Start: 2015-08-20 — End: 2015-08-21
  Administered 2015-08-20 – 2015-08-21 (×3): 2 g via INTRAVENOUS
  Filled 2015-08-20 (×3): qty 50

## 2015-08-20 MED ORDER — FENTANYL CITRATE (PF) 100 MCG/2ML IJ SOLN
INTRAMUSCULAR | Status: AC
  Administered 2015-08-20: 50 ug
  Filled 2015-08-20: qty 2

## 2015-08-20 MED ORDER — PROPOFOL 10 MG/ML IV BOLUS
INTRAVENOUS | Status: AC
  Filled 2015-08-20: qty 20

## 2015-08-20 MED ORDER — INSULIN ASPART 100 UNIT/ML ~~LOC~~ SOLN: 0.0000 [IU] | Freq: Every day | SUBCUTANEOUS | Status: DC

## 2015-08-20 MED ORDER — MIDAZOLAM HCL 2 MG/2ML IJ SOLN
INTRAMUSCULAR | Status: AC
  Administered 2015-08-20: 2 mg
  Filled 2015-08-20: qty 2

## 2015-08-20 MED ORDER — PHENYLEPHRINE 40 MCG/ML (10ML) SYRINGE FOR IV PUSH (FOR BLOOD PRESSURE SUPPORT)
PREFILLED_SYRINGE | INTRAVENOUS | Status: AC
  Filled 2015-08-20: qty 10

## 2015-08-20 MED ORDER — BUPIVACAINE-EPINEPHRINE (PF) 0.5% -1:200000 IJ SOLN
INTRAMUSCULAR | Status: DC | PRN
  Administered 2015-08-20: 40 mL via PERINEURAL

## 2015-08-20 MED ORDER — ACETAMINOPHEN 325 MG PO TABS: 650.0000 mg | ORAL_TABLET | Freq: Four times a day (QID) | ORAL | Status: DC | PRN

## 2015-08-20 MED ORDER — METOCLOPRAMIDE HCL 5 MG/ML IJ SOLN
5.0000 mg | Freq: Three times a day (TID) | INTRAMUSCULAR | Status: DC | PRN
Start: 2015-08-20 — End: 2015-08-23

## 2015-08-20 MED ORDER — ONDANSETRON HCL 4 MG PO TABS: 4.0000 mg | ORAL_TABLET | Freq: Four times a day (QID) | ORAL | Status: DC | PRN

## 2015-08-20 MED ORDER — ONDANSETRON HCL 4 MG/2ML IJ SOLN: 4.0000 mg | Freq: Four times a day (QID) | INTRAMUSCULAR | Status: DC | PRN

## 2015-08-20 MED ORDER — FENTANYL CITRATE (PF) 100 MCG/2ML IJ SOLN
INTRAMUSCULAR | Status: DC | PRN
  Administered 2015-08-20: 25 ug via INTRAVENOUS

## 2015-08-20 MED ORDER — ASPIRIN EC 325 MG PO TBEC: 325.0000 mg | DELAYED_RELEASE_TABLET | Freq: Two times a day (BID) | ORAL | Status: DC

## 2015-08-20 MED ORDER — METOCLOPRAMIDE HCL 5 MG PO TABS: 5.0000 mg | ORAL_TABLET | Freq: Three times a day (TID) | ORAL | Status: DC | PRN

## 2015-08-20 MED ORDER — PROMETHAZINE HCL 25 MG/ML IJ SOLN: 6.2500 mg | INTRAMUSCULAR | Status: DC | PRN

## 2015-08-20 MED ORDER — HYDROCODONE-ACETAMINOPHEN 7.5-325 MG PO TABS: 1.0000 | ORAL_TABLET | Freq: Four times a day (QID) | ORAL | Status: DC | PRN

## 2015-08-20 MED ORDER — LACTATED RINGERS IV SOLN
INTRAVENOUS | Status: DC
  Administered 2015-08-20 – 2015-08-21 (×2): via INTRAVENOUS

## 2015-08-20 MED ORDER — MIDAZOLAM HCL 2 MG/2ML IJ SOLN
INTRAMUSCULAR | Status: AC
  Filled 2015-08-20: qty 2

## 2015-08-20 MED ORDER — OXYCODONE HCL 5 MG PO TABS: 5.0000 mg | ORAL_TABLET | ORAL | Status: DC | PRN

## 2015-08-20 MED ORDER — 0.9 % SODIUM CHLORIDE (POUR BTL) OPTIME
TOPICAL | Status: DC | PRN
  Administered 2015-08-20 (×3): 1000 mL

## 2015-08-20 MED ORDER — SODIUM CHLORIDE 0.9 % IV SOLN
INTRAVENOUS | Status: DC
  Administered 2015-08-20: 18:00:00 via INTRAVENOUS

## 2015-08-20 MED ORDER — METOPROLOL TARTRATE 1 MG/ML IV SOLN
INTRAVENOUS | Status: DC | PRN
  Administered 2015-08-20: 1 mg via INTRAVENOUS

## 2015-08-20 MED ORDER — ARTIFICIAL TEARS OP OINT
TOPICAL_OINTMENT | OPHTHALMIC | Status: AC
  Filled 2015-08-20: qty 7

## 2015-08-20 MED ORDER — ACETAMINOPHEN 650 MG RE SUPP: 650.0000 mg | Freq: Four times a day (QID) | RECTAL | Status: DC | PRN

## 2015-08-20 MED ORDER — MEPERIDINE HCL 25 MG/ML IJ SOLN: 6.2500 mg | INTRAMUSCULAR | Status: DC | PRN

## 2015-08-20 MED ORDER — MEPIVACAINE HCL 1.5 % IJ SOLN
INTRAMUSCULAR | Status: DC | PRN
  Administered 2015-08-20: 20 mL via PERINEURAL

## 2015-08-20 MED ORDER — ASPIRIN EC 325 MG PO TBEC
325.0000 mg | DELAYED_RELEASE_TABLET | Freq: Two times a day (BID) | ORAL | Status: DC
Start: 2015-08-20 — End: 2015-08-23
  Administered 2015-08-20 – 2015-08-23 (×6): 325 mg via ORAL
  Filled 2015-08-20 (×6): qty 1

## 2015-08-20 MED ORDER — DIPHENHYDRAMINE HCL 12.5 MG/5ML PO ELIX: 25.0000 mg | ORAL_SOLUTION | ORAL | Status: DC | PRN

## 2015-08-20 MED ORDER — METOPROLOL TARTRATE 1 MG/ML IV SOLN
INTRAVENOUS | Status: AC
  Filled 2015-08-20: qty 5

## 2015-08-20 MED ORDER — LACTATED RINGERS IV SOLN
INTRAVENOUS | Status: DC | PRN
  Administered 2015-08-20: 15:00:00 via INTRAVENOUS

## 2015-08-20 MED ORDER — INSULIN ASPART 100 UNIT/ML ~~LOC~~ SOLN: 0.0000 [IU] | Freq: Three times a day (TID) | SUBCUTANEOUS | Status: DC

## 2015-08-20 MED ORDER — HYDROMORPHONE HCL 1 MG/ML IJ SOLN: 0.2500 mg | INTRAMUSCULAR | Status: DC | PRN

## 2015-08-20 SURGICAL SUPPLY — 73 items
BANDAGE ELASTIC 4 VELCRO ST LF (GAUZE/BANDAGES/DRESSINGS) IMPLANT
BANDAGE ELASTIC 6 VELCRO ST LF (GAUZE/BANDAGES/DRESSINGS) ×1 IMPLANT
BANDAGE ESMARK 6X9 LF (GAUZE/BANDAGES/DRESSINGS) IMPLANT
BLADE SURG 15 STRL LF DISP TIS (BLADE) ×1 IMPLANT
BLADE SURG 15 STRL SS (BLADE) ×2
BNDG CMPR 9X6 STRL LF SNTH (GAUZE/BANDAGES/DRESSINGS) ×1
BNDG COHESIVE 4X5 TAN STRL (GAUZE/BANDAGES/DRESSINGS) ×2 IMPLANT
BNDG COHESIVE 6X5 TAN STRL LF (GAUZE/BANDAGES/DRESSINGS) IMPLANT
BNDG ESMARK 6X9 LF (GAUZE/BANDAGES/DRESSINGS) ×2
CANISTER SUCT 3000ML PPV (MISCELLANEOUS) ×2 IMPLANT
COVER SURGICAL LIGHT HANDLE (MISCELLANEOUS) ×2 IMPLANT
CUFF TOURNIQUET SINGLE 34IN LL (TOURNIQUET CUFF) ×1 IMPLANT
CUFF TOURNIQUET SINGLE 44IN (TOURNIQUET CUFF) IMPLANT
DRAPE C-ARM 42X72 X-RAY (DRAPES) ×2 IMPLANT
DRAPE C-ARMOR (DRAPES) ×2 IMPLANT
DRAPE IMP U-DRAPE 54X76 (DRAPES) ×2 IMPLANT
DRAPE INCISE IOBAN 66X45 STRL (DRAPES) IMPLANT
DRAPE SURG 17X23 STRL (DRAPES) ×1 IMPLANT
DRAPE U-SHAPE 47X51 STRL (DRAPES) ×2 IMPLANT
DRILL 2.6X122MM WL AO SHAFT (BIT) ×1 IMPLANT
DRSG PAD ABDOMINAL 8X10 ST (GAUZE/BANDAGES/DRESSINGS) ×4 IMPLANT
DURAPREP 26ML APPLICATOR (WOUND CARE) ×3 IMPLANT
ELECT CAUTERY BLADE 6.4 (BLADE) ×2 IMPLANT
ELECT REM PT RETURN 9FT ADLT (ELECTROSURGICAL) ×2
ELECTRODE REM PT RTRN 9FT ADLT (ELECTROSURGICAL) ×1 IMPLANT
FACESHIELD WRAPAROUND (MASK) ×4 IMPLANT
GAUZE SPONGE 4X4 12PLY STRL (GAUZE/BANDAGES/DRESSINGS) ×2 IMPLANT
GAUZE XEROFORM 5X9 LF (GAUZE/BANDAGES/DRESSINGS) ×2 IMPLANT
GLOVE BIOGEL PI IND STRL 6.5 (GLOVE) IMPLANT
GLOVE BIOGEL PI IND STRL 7.5 (GLOVE) IMPLANT
GLOVE BIOGEL PI INDICATOR 6.5 (GLOVE) ×1
GLOVE BIOGEL PI INDICATOR 7.5 (GLOVE) ×2
GLOVE NEODERM STRL 7.5 LF PF (GLOVE) ×1 IMPLANT
GLOVE SURG NEODERM 7.5  LF PF (GLOVE) ×1
GLOVE SURG SS PI 6.5 STRL IVOR (GLOVE) ×1 IMPLANT
GLOVE SURG SS PI 7.5 STRL IVOR (GLOVE) ×1 IMPLANT
GLOVE SURG SYN 7.5  E (GLOVE) ×2
GLOVE SURG SYN 7.5 E (GLOVE) ×2 IMPLANT
GLOVE SURG SYN 7.5 PF PI (GLOVE) ×2 IMPLANT
GOWN L4 LG 24 PK N/S (GOWN DISPOSABLE) ×2 IMPLANT
GOWN STRL REIN XL XLG (GOWN DISPOSABLE) ×3 IMPLANT
K-WIRE ORTHOPEDIC 1.4X150L (WIRE) ×4
KIT BASIN OR (CUSTOM PROCEDURE TRAY) ×2 IMPLANT
KIT ROOM TURNOVER OR (KITS) ×2 IMPLANT
KWIRE ORTHOPEDIC 1.4X150L (WIRE) IMPLANT
NDL HYPO 25GX1X1/2 BEV (NEEDLE) IMPLANT
NEEDLE HYPO 25GX1X1/2 BEV (NEEDLE) IMPLANT
NS IRRIG 1000ML POUR BTL (IV SOLUTION) ×2 IMPLANT
PACK ORTHO EXTREMITY (CUSTOM PROCEDURE TRAY) ×2 IMPLANT
PAD ARMBOARD 7.5X6 YLW CONV (MISCELLANEOUS) ×4 IMPLANT
PAD CAST 3X4 CTTN HI CHSV (CAST SUPPLIES) ×2 IMPLANT
PADDING CAST COTTON 3X4 STRL (CAST SUPPLIES) ×4
PADDING CAST COTTON 6X4 STRL (CAST SUPPLIES) ×2 IMPLANT
PLATE FIBULA 4H (Plate) ×2 IMPLANT
SCREW 3.5X10MM (Screw) ×2 IMPLANT
SCREW BONE 18 (Screw) ×1 IMPLANT
SCREW BONE 3.5X20MM (Screw) ×1 IMPLANT
SCREW BONE NON-LCKING 3.5X12MM (Screw) ×2 IMPLANT
SCREW LOCK 3.5X14 (Screw) ×2 IMPLANT
SCREW LOCKING 3.5X16MM (Screw) ×1 IMPLANT
SPLINT FIBERGLASS 4X30 (CAST SUPPLIES) ×1 IMPLANT
SPONGE LAP 18X18 X RAY DECT (DISPOSABLE) IMPLANT
SUCTION FRAZIER TIP 10 FR DISP (SUCTIONS) ×2 IMPLANT
SUT ETHILON 3 0 PS 1 (SUTURE) ×3 IMPLANT
SUT VIC AB 0 CT1 27 (SUTURE) ×2
SUT VIC AB 0 CT1 27XBRD ANBCTR (SUTURE) IMPLANT
SUT VIC AB 2-0 CT1 27 (SUTURE) ×2
SUT VIC AB 2-0 CT1 TAPERPNT 27 (SUTURE) IMPLANT
SYR CONTROL 10ML LL (SYRINGE) IMPLANT
TOWEL OR 17X24 6PK STRL BLUE (TOWEL DISPOSABLE) ×2 IMPLANT
TOWEL OR 17X26 10 PK STRL BLUE (TOWEL DISPOSABLE) ×4 IMPLANT
TUBE CONNECTING 12X1/4 (SUCTIONS) ×2 IMPLANT
WATER STERILE IRR 1000ML POUR (IV SOLUTION) ×2 IMPLANT

## 2015-08-20 NOTE — H&P (Signed)

## 2015-08-20 NOTE — Op Note (Signed)
   Date of Surgery: 08/20/2015  INDICATIONS: Ms. Borland is a 69 y.o.-year-old female who sustained a left ankle fracture; she was indicated for open reduction and internal fixation due to the displaced nature of the articular fracture and came to the operating room today for this procedure. The patient did consent to the procedure after discussion of the risks and benefits.  PREOPERATIVE DIAGNOSIS: left bimalleolar ankle fracture  POSTOPERATIVE DIAGNOSIS: Same.  PROCEDURE:  1. Open treatment of left ankle fracture with internal fixation. Bimalleolar CPT [id number] 2. Debridement of bone, skin, muscle associated with open fracture, separate incision.  SURGEON: N. Eduard Roux, M.D.  ASSIST: April Green, RNFA; Ky Barban, RNFA.  ANESTHESIA:  general, regional  TOURNIQUET TIME: less than 60 minutes  IV FLUIDS AND URINE: See anesthesia.  ESTIMATED BLOOD LOSS: minimal mL.  IMPLANTS: [employer] Variax 4 hole distal fibula plate  COMPLICATIONS: None.  DESCRIPTION OF PROCEDURE: The patient was brought to the operating room and placed supine on the operating table.  The patient had been signed prior to the procedure and this was documented. The patient had the anesthesia placed by the anesthesiologist.  A nonsterile tourniquet was placed on the upper thigh.  The prep verification and incision time-outs were performed to confirm that this was the correct patient, site, side and location. The patient had an SCD on the opposite lower extremity. The patient did receive antibiotics prior to the incision and was re-dosed during the procedure as needed at indicated intervals.  The patient had the lower extremity prepped and draped in the standard surgical fashion.  The extremity was exsanguinated using an esmarch bandage and the tourniquet was inflated to 300 mm Hg.  Lateral incision over the distal fibula was created. Full-thickness flaps were developed. The fracture was exposed. Entrapped hematoma and  organized hematoma was removed. The fracture was reduced and held with a clamp. A lag screw was placed from anterior to posterior. The clamp was removed and the fracture maintained reduction. A 4 holed recontour plate was placed on the lateral aspect of the fibula. Locking and nonlocking screws were placed both proximally and distally in the plate through the fibula. X-rays were taken to confirm proper placement.  An external rotation stress test was then performed in the medial clear space was stable. We then reopened up the traumatic incision on the medial aspect of the ankle. Sharp excisional debridement of the bone, skin, muscle was performed with a knife and rongeur. This was thoroughly irrigated. The surgical wound on the lateral side was also was thoroughly irrigated. The medial traumatic wound was closed with interrupted 3-0 nylon sutures. The surgical wound on the lateral side was closed with 0 Vicryl, 2-0 Vicryl, 3-0 nylon. Sterile dressings were applied. The foot was immobilized in a short-leg splint. Patient tolerated the procedure was explained transferred to the PACU in stable condition.  POSTOPERATIVE PLAN: Ms. Montieth will remain nonweightbearing on this leg for approximately 6 weeks; Ms. Malachi will return for suture removal in 2 weeks.  He will be immobilized in a short leg splint and then transitioned to a CAM walker at his first follow up appointment.  Ms. Wilds will receive DVT prophylaxis based on other medications, activity level, and risk ratio of bleeding to thrombosis.  Azucena Cecil, MD [facility] [phone] 5:18 PM

## 2015-08-20 NOTE — Anesthesia Procedure Notes (Addendum)
Anesthesia Regional Block:  Adductor canal block  Pre-Anesthetic Checklist: ,, timeout performed, Correct Patient, Correct Site, Correct Laterality, Correct Procedure, Correct Position, site marked, Risks and benefits discussed, Surgical consent,  Pre-op evaluation,  Post-op pain management  Laterality: Left  Prep: chloraprep       Needles:  Injection technique: Single-shot  Needle Type: Stimiplex     Needle Length: 9cm 9 cm Needle Gauge: 21 and 21 G    Additional Needles:  Procedures: ultrasound guided (picture in chart) Adductor canal block Narrative:  Injection made incrementally with aspirations every 5 mL.  Performed by: Personally  Anesthesiologist: Nolon Nations  Additional Notes: BP cuff, EKG monitors applied. Sedation begun. Artery and nerve location verified with U/S and anesthetic injected incrementally, slowly, and after negative aspirations under direct u/s guidance. Good fascial /perineural spread. Tolerated well.   Anesthesia Regional Block:  Popliteal block  Pre-Anesthetic Checklist: ,, timeout performed, Correct Patient, Correct Site, Correct Laterality, Correct Procedure, Correct Position, site marked, Risks and benefits discussed, Surgical consent,  Pre-op evaluation,  Post-op pain management  Laterality: Left  Prep: chloraprep       Needles:  Injection technique: Single-shot  Needle Type: Stimiplex     Needle Length: 10cm 10 cm Needle Gauge: 21 and 21 G    Additional Needles:  Procedures: ultrasound guided (picture in chart) and nerve stimulator  Motor weakness within 5 minutes. Popliteal block  Nerve Stimulator or Paresthesia:  Response: Plantar flexion/toe flexion, 0.6 mA,   Additional Responses:   Narrative:  Injection made incrementally with aspirations every 5 mL.  Performed by: Personally  Anesthesiologist: Nolon Nations  Additional Notes: Nerve located and needle positioned with direct ultrasound guidance. Good  perineural spread. Patient tolerated well.   Procedure Name: LMA Insertion Date/Time: 08/20/2015 4:09 PM Performed by: Collier Bullock Pre-anesthesia Checklist: Patient identified, Emergency Drugs available, Suction available and Patient being monitored Patient Re-evaluated:Patient Re-evaluated prior to inductionOxygen Delivery Method: Circle system utilized Preoxygenation: Pre-oxygenation with 100% oxygen Intubation Type: IV induction LMA: LMA inserted LMA Size: 4.0 Number of attempts: 1 Placement Confirmation: positive ETCO2 and breath sounds checked- equal and bilateral Tube secured with: Tape Dental Injury: Teeth and Oropharynx as per pre-operative assessment

## 2015-08-20 NOTE — Discharge Instructions (Signed)
° ° °  1. Keep splint clean and dry °2. Elevate foot above level of the heart °3. Take aspirin to prevent blood clots °4. Take pain meds as needed °5. Strict non weight bearing to operative extremity ° °

## 2015-08-20 NOTE — Anesthesia Preprocedure Evaluation (Addendum)
Anesthesia Evaluation  Patient identified by MRN, date of birth, ID band Patient awake    Reviewed: Allergy & Precautions, NPO status , Patient's Chart, lab work & pertinent test results  Airway Mallampati: I  TM Distance: >3 FB Neck ROM: Full    Dental  (+) Edentulous Upper, Edentulous Lower, Dental Advidsory Given   Pulmonary neg pulmonary ROS,    breath sounds clear to auscultation       Cardiovascular hypertension, Pt. on medications  Rhythm:Regular Rate:Normal     Neuro/Psych negative neurological ROS     GI/Hepatic negative GI ROS, Neg liver ROS,   Endo/Other  diabetes, Type 2Morbid obesity  Renal/GU negative Renal ROS     Musculoskeletal   Abdominal   Peds  Hematology negative hematology ROS (+)   Anesthesia Other Findings   Reproductive/Obstetrics                           Anesthesia Physical  Anesthesia Plan  ASA: III  Anesthesia Plan: Regional   Post-op Pain Management:    Induction: Intravenous  Airway Management Planned:   Additional Equipment:   Intra-op Plan:   Post-operative Plan: Extubation in OR  Informed Consent: I have reviewed the patients History and Physical, chart, labs and discussed the procedure including the risks, benefits and alternatives for the proposed anesthesia with the patient or authorized representative who has indicated his/her understanding and acceptance.   Dental advisory given and Dental Advisory Given  Plan Discussed with: CRNA, Anesthesiologist and Surgeon  Anesthesia Plan Comments:       Anesthesia Quick Evaluation

## 2015-08-20 NOTE — Anesthesia Postprocedure Evaluation (Signed)
Anesthesia Post Note  Patient: Tanya Juarez  Procedure(s) Performed: Procedure(s) (LRB): OPEN REDUCTION INTERNAL FIXATION (ORIF) LEFT ANKLE FRACTURE, SYNDESMOSIS INJURY (Left)  Patient location during evaluation: PACU Anesthesia Type: General and Regional Level of consciousness: awake Pain management: pain level controlled Vital Signs Assessment: post-procedure vital signs reviewed and stable Respiratory status: spontaneous breathing Cardiovascular status: stable Postop Assessment: No signs of nausea or vomiting Anesthetic complications: no    Last Vitals:  Filed Vitals:   08/20/15 1736 08/20/15 1801  BP: 154/67 126/51  Pulse: 88 83  Temp: 36.8 C 36.7 C  Resp: 16 18    Last Pain:  Filed Vitals:   08/20/15 1802  PainSc: 0-No pain    LLE Motor Response: Non-purposeful movement (unable to wiggle toes) LLE Sensation: Numbness, Decreased (d/t block)          Barbi Kumagai

## 2015-08-20 NOTE — Transfer of Care (Signed)
Immediate Anesthesia Transfer of Care Note  Patient: Tanya Juarez  Procedure(s) Performed: Procedure(s): OPEN REDUCTION INTERNAL FIXATION (ORIF) LEFT ANKLE FRACTURE, SYNDESMOSIS INJURY (Left)  Patient Location: PACU  Anesthesia Type:MAC, General and Regional  Level of Consciousness: awake, alert  and oriented  Airway & Oxygen Therapy: Patient Spontanous Breathing and Patient connected to face mask oxygen  Post-op Assessment: Report given to RN, Post -op Vital signs reviewed and stable and Patient moving all extremities  Post vital signs: Reviewed and stable  Last Vitals:  Filed Vitals:   08/20/15 1535 08/20/15 1715  BP: 128/51 110/54  Pulse: 94   Temp:  36.7 C  Resp: 18 16    Complications: No apparent anesthesia complications

## 2015-08-21 LAB — BASIC METABOLIC PANEL
Anion gap: 6 (ref 5–15)
BUN: 9 mg/dL (ref 6–20)
CALCIUM: 8.1 mg/dL — AB (ref 8.9–10.3)
CO2: 26 mmol/L (ref 22–32)
CREATININE: 0.71 mg/dL (ref 0.44–1.00)
Chloride: 103 mmol/L (ref 101–111)
GFR calc Af Amer: >60 mL/min (ref >60)
GLUCOSE: 119 mg/dL — AB (ref 65–99)
Potassium: 4 mmol/L (ref 3.5–5.1)
Sodium: 135 mmol/L (ref 135–145)

## 2015-08-21 LAB — CBC
HCT: 28.5 % — ABNORMAL LOW (ref 36.0–46.0)
Hemoglobin: 9.1 g/dL — ABNORMAL LOW (ref 12.0–15.0)
MCH: 30.7 pg (ref 26.0–34.0)
MCHC: 31.9 g/dL (ref 30.0–36.0)
MCV: 96.3 fL (ref 78.0–100.0)
Platelets: 217 10*3/uL (ref 150–400)
RBC: 2.96 MIL/uL — ABNORMAL LOW (ref 3.87–5.11)
RDW: 13.6 % (ref 11.5–15.5)
WBC: 11.8 10*3/uL — ABNORMAL HIGH (ref 4.0–10.5)

## 2015-08-21 LAB — GLUCOSE, CAPILLARY
GLUCOSE-CAPILLARY: 109 mg/dL — AB (ref 65–99)
GLUCOSE-CAPILLARY: 140 mg/dL — AB (ref 65–99)

## 2015-08-21 NOTE — Evaluation (Signed)
Physical Therapy Evaluation Patient Details Name: Tanya Juarez MRN: [id number] DOB: [birth year] Today's Date: 08/21/2015   History of Present Illness  Tanya Juarez is a 69 y.o. female who complains of left ankle injury s/p mechanical fall.  Positive for Left bimall fx dislocation.  Now s/p ORIF.  Clinical Impression  Patient presents with decreased mobility due to deficits listed in PT problem list.  She will benefit from skilled PT in the acute setting to allow return home following SNF rehab stay.    Follow Up Recommendations SNF    Equipment Recommendations  Other (comment) (TBD at SNF, if d/c home would need w/c)    Recommendations for Other Services       Precautions / Restrictions Precautions Precautions: Fall Restrictions LLE Weight Bearing: Non weight bearing      Mobility  Bed Mobility Overal bed mobility: Needs Assistance Bed Mobility: Supine to Sit     Supine to sit: HOB elevated;Supervision     General bed mobility comments: cues for technique  Transfers Overall transfer level: Needs assistance Equipment used: Rolling walker (2 wheeled) Transfers: Sit to/from [facility] Sit to Stand: Min assist Stand pivot transfers: Mod assist       General transfer comment: difficulty pivoting with walker and maintaining NWB L with chair a little distant from bed  Ambulation/Gait                Stairs            Wheelchair Mobility    Modified Rankin (Stroke Patients Only)       Balance Overall balance assessment: Needs assistance   Sitting balance-Leahy Scale: Good     Standing balance support: Bilateral upper extremity supported Standing balance-Leahy Scale: Poor Standing balance comment: UE support needed due to NWB                             Pertinent Vitals/Pain Pain Assessment: Faces Faces Pain Scale: Hurts a little bit Pain Location: L ankle with dependency Pain Descriptors / Indicators: Other  (Comment) (twinge) Pain Intervention(s): Monitored during session;Repositioned;Premedicated before session    Home Living Family/patient expects to be discharged to:: Skilled nursing facility Living Arrangements: Alone                    Prior Function Level of Independence: Independent with assistive device(s)         Comments: used RW outside     Hand Dominance        Extremity/Trunk Assessment   Upper Extremity Assessment: Overall WFL for tasks assessed           Lower Extremity Assessment: RLE deficits/detail;LLE deficits/detail RLE Deficits / Details: AROM WFL, strength hip flexion 3+/5, knee extension 4/5 LLE Deficits / Details: wiggles toes and able to lift leg with splint on     Communication   Communication: No difficulties  Cognition Arousal/Alertness: Awake/alert Behavior During Therapy: WFL for tasks assessed/performed Overall Cognitive Status: Within Functional Limits for tasks assessed                      General Comments      Exercises        Assessment/Plan    PT Assessment Patient needs continued PT services  PT Diagnosis Difficulty walking;Acute pain   PT Problem List Decreased strength;Decreased balance;Decreased mobility;Pain;Decreased knowledge of use of DME;Decreased activity tolerance  PT Treatment Interventions Balance training;Gait training;DME  instruction;Functional mobility training;Patient/family education;Therapeutic activities;Therapeutic exercise   PT Goals (Current goals can be found in the Care Plan section) Acute Rehab PT Goals Patient Stated Goal: To go to rehab PT Goal Formulation: With patient Time For Goal Achievement: 09/04/15 Potential to Achieve Goals: Good    Frequency Min 3X/week   Barriers to discharge        Co-evaluation               End of Session Equipment Utilized During Treatment: Gait belt Activity Tolerance: Patient tolerated treatment well Patient left: in chair;with  call bell/phone within reach           Time: 1202-1222 PT Time Calculation (min) (ACUTE ONLY): 20 min   Charges:   PT Evaluation $Initial PT Evaluation Tier I: 1 Procedure     PT G Codes:        Wagner Tanzi,CYNDI 09-06-2015, 12:34 PM  Magda Kiel, [place] Sep 06, 2015

## 2015-08-21 NOTE — Progress Notes (Signed)
Patient doing well pain is minimal block as worn off Left foot perfused toes are mobile and sensate Dressing is intact Mobilize today with physical therapy skilled nursing facility placement pending

## 2015-08-22 LAB — GLUCOSE, CAPILLARY
GLUCOSE-CAPILLARY: 113 mg/dL — AB (ref 65–99)
Glucose-Capillary: 130 mg/dL — ABNORMAL HIGH (ref 65–99)
Glucose-Capillary: 137 mg/dL — ABNORMAL HIGH (ref 65–99)
Glucose-Capillary: 132 mg/dL — ABNORMAL HIGH (ref 65–99)

## 2015-08-22 NOTE — Progress Notes (Signed)
   Subjective:  Patient reports pain as mild.    Objective:   VITALS:   Filed Vitals:   08/21/15 [id number] 08/21/15 1500 08/21/15 1941 08/22/15 0252  BP: 148/64 141/64 154/74 162/75  Pulse: 84 70 91 89  Temp: 98.6 F (37 C) 97.4 F (36.3 C) 98.2 F (36.8 C) 97.8 F (36.6 C)  TempSrc: Oral Oral Oral Oral  Resp: 18 20 20 20   Height:      Weight:      SpO2: 98% 99% 98% 98%    Neurologically intact Neurovascular intact Sensation intact distally Intact pulses distally Dorsiflexion/Plantar flexion intact Incision: dressing C/D/I and no drainage No cellulitis present Compartment soft   Lab Results  Component Value Date   WBC 11.8* 08/21/2015   HGB 9.1* 08/21/2015   HCT 28.5* 08/21/2015   MCV 96.3 08/21/2015   PLT 217 08/21/2015     Assessment/Plan:  2 Days Post-Op   - Expected postop acute blood loss anemia - will monitor for symptoms - Up with PT/OT - SNF - DVT ppx - SCDs, ambulation, asa - NWB operative extremity - Pain control - Discharge planning - to SNF when bed available, patient is medically stable for d/c - f/u in 1 week for wound check  Marianna Payment 08/22/2015, 8:10 AM [phone]

## 2015-08-22 NOTE — Clinical Social Work Note (Signed)
Clinical Social Work Assessment  Patient Details  Name: Tanya Juarez MRN: [id number] Date of Birth: 24-Nov-1945  Date of referral:  08/22/15               Reason for consult:  Facility Placement, Discharge Planning                Permission sought to share information with:  Chartered certified accountant granted to share information::  Yes, Verbal Permission Granted  Name::        Agency::  [facility]  Relationship::     Contact Information:     Housing/Transportation Living arrangements for the past 2 months:  Single Family Home Source of Information:    Patient Interpreter Needed:  None Criminal Activity/Legal Involvement Pertinent to Current Situation/Hospitalization:  No - Comment as needed Significant Relationships:  Adult Children Lives with:  Self Do you feel safe going back to the place where you live?  No (High fall risk.) Need for family participation in patient care:  No (Coment) (Patient able to make own decisions, but updates son via phone.)  Care giving concerns:  Patient expressed no concerns at this time.   Social Worker assessment / plan:  CSW received referral for possible SNF placement at time of discharge. CSW met with patient at bedside to discuss discharge planning needs. Patient expressed understanding and agreeable to SNF placement at time of discharge. Per patient, patient would prefer [facility] or [facility] at time of discharge. CSW to continue to follow and assist with discharge planning needs.  Employment status:  Retired Forensic scientist:  Programmer, applications (Marine scientist) PT Recommendations:  [place] / Referral to community resources:  [place]  Patient/Family's Response to care:  Patient understanding and agreeable to CSW plan of care.  Patient/Family's Understanding of and Emotional Response to Diagnosis, Current Treatment, and Prognosis:  Patient  understanding and agreeable to CSW plan of care.  Emotional Assessment Appearance:  Appears stated age Attitude/Demeanor/Rapport:  Other (Pleasant.) Affect (typically observed):  Accepting, Appropriate, Pleasant Orientation:  Oriented to Self, Oriented to Place, Oriented to  Time, Oriented to Situation Alcohol / Substance use:  Not Applicable Psych involvement (Current and /or in the community):  No (Comment) (Not appropriate on this admission.)  Discharge Needs  Concerns to be addressed:  No discharge needs identified Readmission within the last 30 days:  No Current discharge risk:  None Barriers to Discharge:  No Barriers Identified   Caroline Sauger, LCSW 08/22/2015, 2:32 PM [phone]

## 2015-08-22 NOTE — Discharge Summary (Addendum)
Physician Discharge Summary      Patient ID: Tanya Juarez MRN: [id number] DOB/AGE: [birth year] 69 y.o.  Admit date: 08/18/2015 Discharge date: 08/23/15  Admission Diagnoses:  <principal problem not specified>  Discharge Diagnoses:  Active Problems:   Open left ankle fracture   Fracture   Past Medical History  Diagnosis Date  . Hypertension   . Diabetes mellitus without complication ([place])     Surgeries: Procedure(s): OPEN REDUCTION INTERNAL FIXATION (ORIF) LEFT ANKLE FRACTURE, SYNDESMOSIS INJURY on 08/18/2015 - 08/20/2015   Consultants (if any):    Discharged Condition: Improved  Hospital Course: Tanya Juarez is an 69 y.o. female who was admitted 08/18/2015 with a diagnosis of <principal problem not specified> and went to the operating room on 08/18/2015 - 08/20/2015 and underwent the above named procedures.    She was given perioperative antibiotics:      Anti-infectives    Start     Dose/Rate Route Frequency Ordered Stop   08/20/15 2200  ceFAZolin (ANCEF) IVPB 2 g/50 mL premix     2 g 100 mL/hr over 30 Minutes Intravenous Every 6 hours 08/20/15 1807 08/21/15 1059   08/19/15 0900  ceFAZolin (ANCEF) IVPB 2 g/50 mL premix     2 g 100 mL/hr over 30 Minutes Intravenous Every 6 hours 08/19/15 0408 08/20/15 0003   08/19/15 0215  [MAR Hold]  ceFAZolin (ANCEF) IVPB 2 g/50 mL premix     (MAR Hold since 08/20/15 1428)   2 g 100 mL/hr over 30 Minutes Intravenous  Once 08/19/15 0211 08/20/15 1554   08/18/15 2245  ceFAZolin (ANCEF) IVPB 1 g/50 mL premix     1 g 100 mL/hr over 30 Minutes Intravenous  Once 08/18/15 2240 08/19/15 0020    .  She was given sequential compression devices, early ambulation, and aspirin for DVT prophylaxis.  She benefited maximally from the hospital stay and there were no complications.    Recent vital signs:  Filed Vitals:   08/22/15 1307 08/22/15 2024  BP: 123/68 160/73  Pulse: 79 77  Temp: 97.1 F (36.2 C) 98.3 F (36.8 C)  Resp:  18 18    Recent laboratory studies:  Lab Results  Component Value Date   HGB 9.1* 08/21/2015   HGB 13.1 08/18/2015   HGB 15.6* 02/04/2012   Lab Results  Component Value Date   WBC 11.8* 08/21/2015   PLT 217 08/21/2015   Lab Results  Component Value Date   INR 1.05 08/18/2015   Lab Results  Component Value Date   NA 135 08/21/2015   K 4.0 08/21/2015   CL 103 08/21/2015   CO2 26 08/21/2015   BUN 9 08/21/2015   CREATININE 0.71 08/21/2015   GLUCOSE 119* 08/21/2015    Discharge Medications:     Medication List    STOP taking these medications        aspirin 81 MG tablet  Replaced by:  aspirin EC 325 MG tablet      TAKE these medications        aspirin EC 325 MG tablet  Take 1 tablet (325 mg total) by mouth 2 (two) times daily.     cloNIDine 0.2 MG tablet  Commonly known as:  CATAPRES  Take 0.2 mg by mouth 2 (two) times daily.     HYDROcodone-acetaminophen 7.5-325 MG tablet  Commonly known as:  NORCO  Take 1-2 tablets by mouth every 6 (six) hours as needed for moderate pain.     lisinopril-hydrochlorothiazide 20-25 MG tablet  Commonly  known as:  PRINZIDE,ZESTORETIC  Take 1 tablet by mouth daily.     metFORMIN 500 MG 24 hr tablet  Commonly known as:  GLUCOPHAGE-XR  Take 1 tablet by mouth daily.     pravastatin 20 MG tablet  Commonly known as:  PRAVACHOL  Take 1 tablet by mouth daily.     vitamin B-12 1000 MCG tablet  Commonly known as:  CYANOCOBALAMIN  Take 1,000 mcg by mouth daily.     Vitamin D3 1000 UNITS Caps  Take 1,000 Units by mouth daily.        Diagnostic Studies: Dg Ankle Complete Left  09-04-2015  CLINICAL DATA:  ORIF LEFT ankle EXAM: DG C-ARM 61-120 MIN; LEFT ANKLE COMPLETE - 3+ VIEW COMPARISON:  None. FINDINGS: Dynamic plate fixation of the distal fibula.  Ankle mortise intact. IMPRESSION: ORIF distal fibular fracture Electronically Signed   By: Suzy Bouchard M.D.   On: 09/04/15 17:10   Dg Ankle Complete Left  08/18/2015   CLINICAL DATA:  Fall with ankle fracture. EXAM: LEFT ANKLE COMPLETE - 3+ VIEW COMPARISON:  None. FINDINGS: Oblique fracture left lateral malleolus. Transverse fracture of the medial malleolar tip. Abnormal widening of the mortise if, which measures up to 10 mm craniocaudad medially and up to 12 mm between the medial malleolus and the talus. The shaft of the fibula and the shaft of the tibia are both displaced medially. I do not see a distinct fracture of the posterior malleolus. Vascular calcifications noted.  Large plantar calcaneal spur. Severe soft tissue swelling noted. Spurring in the midfoot is present. IMPRESSION: 1. Prominent Weber B fracture with oblique lateral malleolar component, transverse medial malleolar tip component, and medial displacement of the tibia and fibular shaft with respect to the talus. 2. Midfoot degenerative spurring. 3. Considerable vascular calcifications. 4. Considerable soft tissue swelling. Electronically Signed   By: Van Clines M.D.   On: 08/18/2015 23:34   Ct Angio Low Extrem Left W/cm &/or Wo/cm  08/19/2015  CLINICAL DATA:  Status post fall in kitchen, with open left ankle fracture. Assess for vascular injury. Initial encounter. EXAM: CT ANGIOGRAPHY OF THE LEFT LOWER EXTREMITY TECHNIQUE: Multidetector CT imaging of the left lower extremity was performed using the standard protocol during bolus administration of intravenous contrast. Multiplanar CT image reconstructions and MIPs were obtained to evaluate the vascular anatomy. CONTRAST:  125mL OMNIPAQUE IOHEXOL 350 MG/ML SOLN COMPARISON:  Left ankle radiographs performed 08/18/2015 FINDINGS: A minimally displaced distal fibular fracture is noted. No additional fractures are seen. Chronic osseous fragments at the medial malleolus reflect remote injury. The ankle mortise is grossly unremarkable in appearance. There is no evidence of talar tilt or subluxation. There is suggestion of patent three-vessel runoff to the right  foot, though this is difficult to fully assess due to the extent of diffuse calcific atherosclerotic disease. There is no evidence of vascular injury. Soft tissue injury is noted at the medial aspect of the right ankle, with an open wound. Diffuse soft tissue swelling is noted about the ankle and dorsum of the foot. The flexor and extensor tendons are grossly unremarkable. There appears to be fluid tracking along the peroneal tendons, raising concern for some degree of tenosynovitis. A small knee joint effusion is noted. Review of the MIP images confirms the above findings. IMPRESSION: 1. Minimally displaced distal fibular fracture noted. 2. Chronic osseous fragments noted at the medial malleolus, reflecting remote injury. 3. Suggestion of patent three-vessel runoff to the right foot, though this is difficult to  fully assess due to the extent of diffuse calcific atherosclerotic disease. No evidence of vascular injury. 4. Soft tissue injury at the medial aspect of the right ankle, with open wound. Diffuse soft tissue swelling noted about the ankle and dorsum of the foot. 5. Fluid tracking along the peroneal tendons, raising question for some degree of tenosynovitis. 6. Small knee joint effusion noted. Electronically Signed   By: Garald Balding M.D.   On: 08/19/2015 01:46   Dg Knee Complete 4 Views Left  08/18/2015  CLINICAL DATA:  Left knee bruising after crawling to another room. Initial encounter. EXAM: LEFT KNEE - COMPLETE 4+ VIEW COMPARISON:  None. FINDINGS: There is no evidence of fracture or dislocation. The joint spaces are preserved. No significant degenerative change is seen; the patellofemoral joint is grossly unremarkable in appearance. No significant joint effusion is seen. Diffuse vascular calcifications are seen. IMPRESSION: 1. No evidence of fracture or dislocation. 2. Diffuse vascular calcifications seen. Electronically Signed   By: Garald Balding M.D.   On: 08/18/2015 23:33   Dg C-arm 1-60  Min  08/20/2015  CLINICAL DATA:  ORIF LEFT ankle EXAM: DG C-ARM 61-120 MIN; LEFT ANKLE COMPLETE - 3+ VIEW COMPARISON:  None. FINDINGS: Dynamic plate fixation of the distal fibula.  Ankle mortise intact. IMPRESSION: ORIF distal fibular fracture Electronically Signed   By: Suzy Bouchard M.D.   On: 08/20/2015 17:10    Disposition: 01-Home or Self Care  Discharge Instructions    Call MD / Call 911    Complete by:  As directed   If you experience chest pain or shortness of breath, CALL 911 and be transported to the hospital emergency room.  If you develope a fever above 101.5 F, pus (white drainage) or increased drainage or redness at the wound, or calf pain, call your surgeon's office.     Call MD / Call 911    Complete by:  As directed   If you experience chest pain or shortness of breath, CALL 911 and be transported to the hospital emergency room.  If you develope a fever above 101.5 F, pus (white drainage) or increased drainage or redness at the wound, or calf pain, call your surgeon's office.     Constipation Prevention    Complete by:  As directed   Drink plenty of fluids.  Prune juice may be helpful.  You may use a stool softener, such as Colace (over the counter) 100 mg twice a day.  Use MiraLax (over the counter) for constipation as needed.     Constipation Prevention    Complete by:  As directed   Drink plenty of fluids.  Prune juice may be helpful.  You may use a stool softener, such as Colace (over the counter) 100 mg twice a day.  Use MiraLax (over the counter) for constipation as needed.     Diet - low sodium heart healthy    Complete by:  As directed      Diet - low sodium heart healthy    Complete by:  As directed      Diet general    Complete by:  As directed      Diet general    Complete by:  As directed      Driving restrictions    Complete by:  As directed   No driving while taking narcotic pain meds.     Driving restrictions    Complete by:  As directed   No driving  while taking narcotic  pain meds.     Increase activity slowly as tolerated    Complete by:  As directed      Increase activity slowly as tolerated    Complete by:  As directed            Follow-up Information    Follow up with Marianna Payment, MD In 1 week.   Specialty:  Orthopedic Surgery   Why:  For wound re-check   Contact information:   [place] [phone] [phone]        Signed: Marianna Payment 08/22/2015, 10:37 PM

## 2015-08-22 NOTE — Evaluation (Signed)
Occupational Therapy Evaluation Patient Details Name: Tanya Juarez MRN: [id number] DOB: May 14, 1946 Today's Date: 08/22/2015    History of Present Illness [place] C[place]ri is a 69 y.o. female who complains of left ankle injury s/p mechanical fall.  Positive for Left bimall fx dislocation.  Now s/p ORIF.   Clinical Impression   Pt reports she was independent with ADLs PTA. Currently pt is mod-max assist for ADLs and mobility with decreased ability to maintain NWB status on LLE during functional mobility. Began safety education with pt; pt verbalized understanding. At this time recommending SNF for further rehab prior to returning home in order to maximize independence and safety with ADLs and mobility. Will defer all further OT needs to next venue of care. Please re-consult if change in medical status occurs. Thank you for this referral.     Follow Up Recommendations  SNF;Supervision/Assistance - 24 hour    Equipment Recommendations  Other (comment) (TBD at next venue )    Recommendations for Other Services       Precautions / Restrictions Precautions Precautions: Fall Restrictions Weight Bearing Restrictions: Yes LLE Weight Bearing: Non weight bearing      Mobility Bed Mobility Overal bed mobility: Needs Assistance Bed Mobility: Supine to Sit;Sit to Supine     Supine to sit: Supervision;HOB elevated Sit to supine: Supervision;HOB elevated   General bed mobility comments: Supervision for safety. Pt able to manage LLE independently.   Transfers Overall transfer level: Needs assistance Equipment used: Rolling walker (2 wheeled) Transfers: Sit to/from Stand Sit to Stand: Mod assist         General transfer comment: Mod assist for sit <> stand with 3 attempts before completing sit to stand. Pt with difficulty maintaining NWB on LLE. VC for hand placement and technique.    Balance Overall balance assessment: Needs assistance         Standing balance support:  Bilateral upper extremity supported Standing balance-Leahy Scale: Poor Standing balance comment: RW for support                             ADL Overall ADL's : Needs assistance/impaired Eating/Feeding: Set up;Sitting   Grooming: Set up;Sitting       Lower Body Bathing: Moderate assistance;Sit to/from stand       Lower Body Dressing: Moderate assistance;Sit to/from stand   Toilet Transfer: Maximal assistance;Stand-pivot;BSC;RW Toilet Transfer Details (indicate cue type and reason): Pt unable to take step at this time, states that B legs feel very weak. Toileting- Clothing Manipulation and Hygiene: Moderate assistance;Sit to/from stand       Functional mobility during ADLs: Maximal assistance;Rolling walker General ADL Comments: No family present for OT eval. Educated on home safety, need for assist with ADLs and mobility; pt verbalized understanding.     Vision     Perception     Praxis      Pertinent Vitals/Pain Pain Assessment: No/denies pain     Hand Dominance     Extremity/Trunk Assessment Upper Extremity Assessment Upper Extremity Assessment: Overall WFL for tasks assessed   Lower Extremity Assessment Lower Extremity Assessment: Defer to PT evaluation       Communication Communication Communication: No difficulties   Cognition Arousal/Alertness: Awake/alert Behavior During Therapy: WFL for tasks assessed/performed Overall Cognitive Status: Within Functional Limits for tasks assessed                     General Comments  Exercises       Shoulder Instructions      Home Living Family/patient expects to be discharged to:: Skilled nursing facility Living Arrangements: Alone                                      Prior Functioning/Environment Level of Independence: Independent with assistive device(s)        Comments: used RW outside    OT Diagnosis: Generalized weakness;Acute pain   OT Problem List:      OT Treatment/Interventions:      OT Goals(Current goals can be found in the care plan section) Acute Rehab OT Goals Patient Stated Goal: go to rehab OT Goal Formulation: With patient  OT Frequency:     Barriers to D/C:            Co-evaluation              End of Session Equipment Utilized During Treatment: Gait belt;Rolling walker  Activity Tolerance: Patient tolerated treatment well Patient left: in bed;with call bell/phone within reach;with SCD's reapplied   Time: [id number] OT Time Calculation (min): 13 min Charges:  OT General Charges $OT Visit: 1 Procedure OT Evaluation $Initial OT Evaluation Tier I: 1 Procedure G-Codes:     Binnie Kand M.S., OTR/L Pager: [phone]  08/22/2015, 8:53 AM

## 2015-08-22 NOTE — NC FL2 (Signed)
[place] LEVEL OF CARE SCREENING TOOL     IDENTIFICATION  Patient Name: Tanya Juarez Birthdate: [birth year] Sex: female Admission Date (Current Location): 08/18/2015  [facility] and [place] Number:     Facility and Address:  [facility], [place] [address] [place], [place] [address]      Provider Number: [id number]  Attending Physician Name and Address:  Leandrew Koyanagi, MD  Relative Name and Phone Number:       Current Level of Care: Hospital Recommended Level of Care:   Prior Approval Number:    Date Approved/Denied:   PASRR Number: [id number]  Discharge Plan: SNF    Current Diagnoses: Patient Active Problem List   Diagnosis Date Noted  . Open left ankle fracture 08/19/2015  . Fracture 08/19/2015  . Gait difficulty 04/13/2013  . Diabetic neuropathy ([place]) 04/13/2013  . B12 deficiency 04/13/2013  . Lumbar radiculopathy 04/13/2013    Orientation ACTIVITIES/SOCIAL BLADDER RESPIRATION    Self, Time, Situation, Place  Active, Family supportive Continent Normal  BEHAVIORAL SYMPTOMS/MOOD NEUROLOGICAL BOWEL NUTRITION STATUS  Other (Comment) (n/a)  (n/a) Continent Diet (Please see discharge summary.)  PHYSICIAN VISITS COMMUNICATION OF NEEDS Height & Weight Skin    Verbally 5\' 7"  (170.2 cm) 240 lbs. Surgical wounds          AMBULATORY STATUS RESPIRATION    Assist extensive Normal      Personal Care Assistance Level of Assistance  Bathing, Feeding, Dressing Bathing Assistance: Limited assistance Feeding assistance: Independent Dressing Assistance: Limited assistance      Functional Limitations Info   (n/a)             [place]  PT (By licensed PT), OT (By licensed OT)     PT Frequency: 5 OT Frequency: 5           Additional Factors Info  Code Status, Allergies Code Status Info: FULL Allergies Info: No known allergies.           Current Medications (08/22/2015): Current  Facility-Administered Medications  Medication Dose Route Frequency Provider Last Rate Last Dose  . 0.9 %  sodium chloride infusion   Intravenous Continuous Leandrew Koyanagi, MD 125 mL/hr at 08/20/15 0551    . 0.9 %  sodium chloride infusion   Intravenous Continuous Leandrew Koyanagi, MD 125 mL/hr at 08/20/15 1741    . acetaminophen (TYLENOL) tablet 650 mg  650 mg Oral Q6H PRN Naiping Ephriam Jenkins, MD       Or  . acetaminophen (TYLENOL) suppository 650 mg  650 mg Rectal Q6H PRN Leandrew Koyanagi, MD      . aspirin EC tablet 325 mg  325 mg Oral BID Leandrew Koyanagi, MD   325 mg at 08/22/15 0829  . cloNIDine (CATAPRES) tablet 0.2 mg  0.2 mg Oral BID Leandrew Koyanagi, MD   0.2 mg at 08/21/15 2040  . diphenhydrAMINE (BENADRYL) 12.5 MG/5ML elixir 25 mg  25 mg Oral Q4H PRN Leandrew Koyanagi, MD      . lisinopril (PRINIVIL,ZESTRIL) tablet 20 mg  20 mg Oral Daily Naiping Ephriam Jenkins, MD   20 mg at 08/21/15 1028   And  . hydrochlorothiazide (HYDRODIURIL) tablet 25 mg  25 mg Oral Daily Naiping Ephriam Jenkins, MD   25 mg at 08/21/15 1028  . insulin aspart (novoLOG) injection 0-15 Units  0-15 Units Subcutaneous TID WC Naiping Ephriam Jenkins, MD   2 Units at 08/19/15 1742  . insulin  aspart (novoLOG) injection 0-20 Units  0-20 Units Subcutaneous TID WC Naiping Ephriam Jenkins, MD   0 Units at 08/21/15 [phone]  . insulin aspart (novoLOG) injection 0-5 Units  0-5 Units Subcutaneous QHS Leandrew Koyanagi, MD   0 Units at 08/20/15 2146  . lactated ringers infusion   Intravenous Continuous Nolon Nations, MD 10 mL/hr at 08/21/15 [phone]    . metoCLOPramide (REGLAN) tablet 5-10 mg  5-10 mg Oral Q8H PRN Naiping Ephriam Jenkins, MD       Or  . metoCLOPramide (REGLAN) injection 5-10 mg  5-10 mg Intravenous Q8H PRN Naiping Ephriam Jenkins, MD      . ondansetron [facility]) tablet 4 mg  4 mg Oral Q6H PRN Naiping Ephriam Jenkins, MD       Or  . ondansetron [facility]) injection 4 mg  4 mg Intravenous Q6H PRN Naiping Ephriam Jenkins, MD      . oxyCODONE (Oxy IR/ROXICODONE) immediate release tablet 5-10 mg  5-10 mg Oral Q3H PRN Leandrew Koyanagi, MD   10 mg  at 08/21/15 2040  . pravastatin (PRAVACHOL) tablet 20 mg  20 mg Oral Daily Naiping Ephriam Jenkins, MD   20 mg at 08/21/15 1028   Do not use this list as official medication orders. Please verify with discharge summary.  Discharge Medications:   Medication List    STOP taking these medications        aspirin 81 MG tablet  Replaced by:  aspirin EC 325 MG tablet      TAKE these medications        aspirin EC 325 MG tablet  Take 1 tablet (325 mg total) by mouth 2 (two) times daily.     cloNIDine 0.2 MG tablet  Commonly known as:  CATAPRES  Take 0.2 mg by mouth 2 (two) times daily.     HYDROcodone-acetaminophen 7.5-325 MG tablet  Commonly known as:  NORCO  Take 1-2 tablets by mouth every 6 (six) hours as needed for moderate pain.     lisinopril-hydrochlorothiazide 20-25 MG tablet  Commonly known as:  PRINZIDE,ZESTORETIC  Take 1 tablet by mouth daily.     metFORMIN 500 MG 24 hr tablet  Commonly known as:  GLUCOPHAGE-XR  Take 1 tablet by mouth daily.     pravastatin 20 MG tablet  Commonly known as:  PRAVACHOL  Take 1 tablet by mouth daily.     vitamin B-12 1000 MCG tablet  Commonly known as:  CYANOCOBALAMIN  Take 1,000 mcg by mouth daily.     Vitamin D3 1000 UNITS Caps  Take 1,000 Units by mouth daily.        Relevant Imaging Results:  Relevant Lab Results:  Recent Labs    Additional Information Social Security #: [id number]  Tanya Juarez [phone]

## 2015-08-22 NOTE — Clinical Social Work Placement (Signed)
   CLINICAL SOCIAL WORK PLACEMENT  NOTE  Date:  08/22/2015  Patient Details  Name: Tanya Juarez MRN: [id number] Date of Birth: 10-08-1945  Clinical Social Work is seeking post-discharge placement for this patient at the [place] level of care (*CSW will initial, date and re-position this form in  chart as items are completed):  Yes   Patient/family provided with [place] Work Department's list of facilities offering this level of care within the geographic area requested by the patient (or if unable, by the patient's family).  Yes   Patient/family informed of their freedom to choose among providers that offer the needed level of care, that participate in Medicare, Medicaid or managed care program needed by the patient, have an available bed and are willing to accept the patient.  Yes   Patient/family informed of [place]'s ownership interest in [facility] and [facility], as well as of the fact that they are under no obligation to receive care at these facilities.  PASRR submitted to EDS on 08/22/15     PASRR number received on 08/22/15     Existing PASRR number confirmed on       FL2 transmitted to all facilities in geographic area requested by pt/family on 08/22/15     FL2 transmitted to all facilities within larger geographic area on       Patient informed that his/her managed care company has contracts with or will negotiate with certain facilities, including the following:            Patient/family informed of bed offers received.  Patient chooses bed at       Physician recommends and patient chooses bed at      Patient to be transferred to   on  .  Patient to be transferred to facility by       Patient family notified on   of transfer.  Name of family member notified:        PHYSICIAN Please sign FL2     Additional Comment:    _______________________________________________ Caroline Sauger, LCSW 08/22/2015,  2:36 PM

## 2015-08-22 NOTE — Progress Notes (Signed)
Physical Therapy Treatment Patient Details Name: Tanya Juarez MRN: [id number] DOB: 1946/02/02 Today's Date: 08/22/2015    History of Present Illness Tanya Juarez is a 69 y.o. female who complains of left ankle injury s/p mechanical fall.  Positive for Left bimall fx dislocation.  Now s/p ORIF.    PT Comments    Patient moving slowly with mobility progression. At this time the patient refused to attempt stand pivot to chair but was willing to stand at edge of bed X 5-6 minutes. Observable fatigue in bilateral UEs noted. Repeat standing X2. Recommending SNF at D/C for further rehabilitation.   Follow Up Recommendations  SNF     Equipment Recommendations  None recommended by PT;Other (comment) (to be assessed at next level of care)    Recommendations for Other Services       Precautions / Restrictions Precautions Precautions: Fall Restrictions Weight Bearing Restrictions: Yes LLE Weight Bearing: Non weight bearing    Mobility  Bed Mobility Overal bed mobility: Needs Assistance Bed Mobility: Supine to Sit;Sit to Supine     Supine to sit: Supervision;HOB elevated Sit to supine: Min assist (LLE)      Transfers Overall transfer level: Needs assistance Equipment used: Rolling walker (2 wheeled) Transfers: Sit to/from Stand Sit to Stand: Min assist Stand pivot transfers:  (refusing to attempt)       General transfer comment: Patient refusing to attempt stand pivot turn today. States she is going to go back to bed despite encouragement. Patient was willing to stand at edge of bed X 5-6 minutes. Patient maintaining NWB while standing. Repeat X2  Ambulation/Gait                 Stairs            Wheelchair Mobility    Modified Rankin (Stroke Patients Only)       Balance Overall balance assessment: Needs assistance Sitting-balance support: No upper extremity supported Sitting balance-Leahy Scale: Good     Standing balance support: Bilateral  upper extremity supported Standing balance-Leahy Scale: Poor Standing balance comment: using rw                    Cognition Arousal/Alertness: Awake/alert Behavior During Therapy: WFL for tasks assessed/performed Overall Cognitive Status: Within Functional Limits for tasks assessed                      Exercises      General Comments General comments (skin integrity, edema, etc.): Patient moving slowly, rest breaks taken as needed during session.       Pertinent Vitals/Pain Pain Assessment: No/denies pain    Home Living                      Prior Function            PT Goals (current goals can now be found in the care plan section) Acute Rehab PT Goals Patient Stated Goal: Go somewhere for more rehab before going home.  PT Goal Formulation: With patient Time For Goal Achievement: 09/04/15 Potential to Achieve Goals: Good Progress towards PT goals: Progressing toward goals    Frequency  Min 3X/week    PT Plan Current plan remains appropriate    Co-evaluation             End of Session Equipment Utilized During Treatment: Gait belt Activity Tolerance: Patient tolerated treatment well Patient left: in bed;with call bell/phone within reach;with SCD's reapplied  Time: [id number] PT Time Calculation (min) (ACUTE ONLY): 23 min  Charges:  $Therapeutic Activity: 23-37 mins                    G Codes:      Cassell Clement, PT, CSCS Pager [phone] Office [phone]  08/22/2015, 2:25 PM

## 2015-08-23 LAB — GLUCOSE, CAPILLARY
GLUCOSE-CAPILLARY: 119 mg/dL — AB (ref 65–99)
GLUCOSE-CAPILLARY: 114 mg/dL — AB (ref 65–99)
Glucose-Capillary: 125 mg/dL — ABNORMAL HIGH (ref 65–99)
Glucose-Capillary: 119 mg/dL — ABNORMAL HIGH (ref 65–99)

## 2015-08-23 NOTE — Clinical Social Work Placement (Signed)
   CLINICAL SOCIAL WORK PLACEMENT  NOTE  Date:  08/23/2015  Patient Details  Name: Tanya Juarez MRN: [id number] Date of Birth: 03-23-1946  Clinical Social Work is seeking post-discharge placement for this patient at the [place] level of care (*CSW will initial, date and re-position this form in  chart as items are completed):  Yes   Patient/family provided with [place] Work Department's list of facilities offering this level of care within the geographic area requested by the patient (or if unable, by the patient's family).  Yes   Patient/family informed of their freedom to choose among providers that offer the needed level of care, that participate in Medicare, Medicaid or managed care program needed by the patient, have an available bed and are willing to accept the patient.  Yes   Patient/family informed of [place]'s ownership interest in [facility] and [facility], as well as of the fact that they are under no obligation to receive care at these facilities.  PASRR submitted to EDS on 08/22/15     PASRR number received on 08/22/15     Existing PASRR number confirmed on       FL2 transmitted to all facilities in geographic area requested by pt/family on 08/22/15     FL2 transmitted to all facilities within larger geographic area on       Patient informed that his/her managed care company has contracts with or will negotiate with certain facilities, including the following:        Yes   Patient/family informed of bed offers received.  Patient chooses bed at [facility]     Physician recommends and patient chooses bed at      Patient to be transferred to [facility] on 08/23/15.  Patient to be transferred to facility by [facility]     Patient family notified on 08/23/15 of transfer.  Name of family member notified:  Pt notified at bedside.  Pt to notify son.     PHYSICIAN Please sign FL2     Additional  Comment:    _______________________________________________ Matilde Bash, LCSW 08/23/2015, 3:17 PM

## 2015-08-23 NOTE — Care Management Important Message (Signed)
Important Message  Patient Details  Name: Tanya Juarez MRN: [id number] Date of Birth: [birth year]   Medicare Important Message Given:  Yes    Erenest Rasher, RN 08/23/2015, 4:46 PM

## 2015-08-23 NOTE — Progress Notes (Signed)
Per RN, Pt ready for d/c.  [facility] ready to accept Pt.  Pt aware that [facility] has made a bed offer.  Pt very happy about this. Pt aware that she is ready for d/c today and was going to cal her son and notify him.  RN to call report.  SW to arrange for transportation.  Pt to be d/c'd.  Bernita Raisin, [place] Social Work [phone]

## 2015-08-23 NOTE — Care Management Note (Signed)
Case Management Note  Patient Details  Name: Tanya Juarez MRN: [id number] Date of Birth: 24-Jun-1946  Subjective/Objective:        (ORIF) LEFT ANKLE FRACTURE            Action/Plan: Scheduled dc to SNF. CSW following for SNF placement.   Expected Discharge Date:  08/23/2015              Expected Discharge Plan:  [place]  In-House Referral:     Discharge planning Services  CM Consult    Status of Service:  Completed, signed off  Medicare Important Message Given:    Date Medicare IM Given:    Medicare IM give by:    Date Additional Medicare IM Given:    Additional Medicare Important Message give by:     If discussed at [place] of Stay Meetings, dates discussed:    Additional Comments:  Erenest Rasher, RN 08/23/2015, 4:44 PM

## 2015-08-23 NOTE — Progress Notes (Signed)
Patient discharge to [facility] via [place]. Prescriptions given to PTAR. Report called to [place].

## 2015-08-23 NOTE — Progress Notes (Signed)
Subjective: 3 Days Post-Op Procedure(s) (LRB): OPEN REDUCTION INTERNAL FIXATION (ORIF) LEFT ANKLE FRACTURE, SYNDESMOSIS INJURY (Left) Patient reports pain as moderate.    Objective: Vital signs in last 24 hours: Temp:  [97.1 F (36.2 C)-98.3 F (36.8 C)] 97.9 F (36.6 C) (11/26 0500) Pulse Rate:  [77-79] 77 (11/26 0500) Resp:  [18] 18 (11/26 0500) BP: (123-160)/(68-77) 160/77 mmHg (11/26 0500) SpO2:  [99 %-100 %] 99 % (11/26 0500)  Intake/Output from previous day: 11/25 0701 - 11/26 0700 In: 490 [P.O.:490] Out: -  Intake/Output this shift:     Recent Labs  08/21/15 0333  HGB 9.1*    Recent Labs  08/21/15 0333  WBC 11.8*  RBC 2.96*  HCT 28.5*  PLT 217    Recent Labs  08/21/15 0333  NA 135  K 4.0  CL 103  CO2 26  BUN 9  CREATININE 0.71  GLUCOSE 119*  CALCIUM 8.1*   No results for input(s): LABPT, INR in the last 72 hours.  Neurologically intact  Assessment/Plan: 3 Days Post-Op Procedure(s) (LRB): OPEN REDUCTION INTERNAL FIXATION (ORIF) LEFT ANKLE FRACTURE, SYNDESMOSIS INJURY (Left) Up with therapy,   Mobility problems due to NWB on left ankle.  Continue therapy.   Nester Bachus C 08/23/2015, 7:31 AM

## 2015-08-25 ENCOUNTER — Encounter (HOSPITAL_COMMUNITY): Payer: Self-pay | Admitting: Orthopaedic Surgery

## 2015-08-26 ENCOUNTER — Non-Acute Institutional Stay (SKILLED_NURSING_FACILITY): Payer: Medicare Other | Admitting: Internal Medicine

## 2015-08-26 DIAGNOSIS — S82892S Other fracture of left lower leg, sequela: Secondary | ICD-10-CM | POA: Diagnosis not present

## 2015-08-26 DIAGNOSIS — E1149 Type 2 diabetes mellitus with other diabetic neurological complication: Secondary | ICD-10-CM

## 2015-08-26 DIAGNOSIS — D72829 Elevated white blood cell count, unspecified: Secondary | ICD-10-CM

## 2015-08-26 DIAGNOSIS — D62 Acute posthemorrhagic anemia: Secondary | ICD-10-CM | POA: Diagnosis not present

## 2015-08-26 DIAGNOSIS — R2681 Unsteadiness on feet: Secondary | ICD-10-CM | POA: Diagnosis not present

## 2015-08-26 DIAGNOSIS — E538 Deficiency of other specified B group vitamins: Secondary | ICD-10-CM | POA: Diagnosis not present

## 2015-08-26 DIAGNOSIS — K5900 Constipation, unspecified: Secondary | ICD-10-CM

## 2015-08-26 DIAGNOSIS — E785 Hyperlipidemia, unspecified: Secondary | ICD-10-CM

## 2015-08-26 DIAGNOSIS — I10 Essential (primary) hypertension: Secondary | ICD-10-CM

## 2015-08-26 NOTE — Progress Notes (Signed)
Patient ID: Tanya Juarez, female   DOB: [birth year], 69 y.o.   MRN: [id number]     Facility: [facility]    PCP: Thurman Coyer, MD  Code Status: full code  No Known Allergies  Chief Complaint  Patient presents with  . New Admit To SNF     HPI:  69 y.o. patient is here for short term rehabilitation post hospital admission from 08/18/15-08/23/15 with open left ankle fracture. She underwent open reduction and internal fixation. She is seen in her room today. She has been working with therapy team. She denies any pain at present. Had 2 good bowel movement yesterday after prior to surgery day. Denies any other concerns this visit. No new nursing concern.   Review of Systems:  Constitutional: Negative for fever, chills, diaphoresis.  HENT: Negative for headache, congestion, nasal discharge, difficulty swallowing.   Eyes: Negative for eye pain, blurred vision, double vision and discharge.  Respiratory: Negative for cough, shortness of breath and wheezing.   Cardiovascular: Negative for chest pain, palpitations, leg swelling.  Gastrointestinal: Negative for heartburn, nausea, vomiting, abdominal pain Genitourinary: Negative for dysuria, flank pain.  Musculoskeletal: Negative for back pain, falls in facility Skin: Negative for itching, rash.  Neurological: positive for generalized weakness. Negative for dizziness, tingling, focal weakness Psychiatric/Behavioral: Negative for depression    Past Medical History  Diagnosis Date  . Hypertension   . Diabetes mellitus without complication [facility])    Past Surgical History  Procedure Laterality Date  . Cesarean section    . I&d extremity Left 08/19/2015    Procedure: IRRIGATION AND DEBRIDEMENT LEFT ANKLE FRACTURE;  Surgeon: Leandrew Koyanagi, MD;  Location: [place];  Service: Orthopedics;  Laterality: Left;  . Orif ankle fracture Left 08/20/2015    Procedure: OPEN REDUCTION INTERNAL FIXATION (ORIF) LEFT ANKLE  FRACTURE, SYNDESMOSIS INJURY;  Surgeon: Leandrew Koyanagi, MD;  Location: [place];  Service: Orthopedics;  Laterality: Left;   Social History:   reports that she has never smoked. She has never used smokeless tobacco. She reports that she drinks alcohol. She reports that she does not use illicit drugs.  Family History  Problem Relation Age of Onset  . Cancer Mother   . Lung cancer Father   . Hypertension      Medications:   Medication List       This list is accurate as of: 08/26/15  1:11 PM.  Always use your most recent med list.               aspirin EC 325 MG tablet  Take 1 tablet (325 mg total) by mouth 2 (two) times daily.     cloNIDine 0.2 MG tablet  Commonly known as:  CATAPRES  Take 0.2 mg by mouth 2 (two) times daily.     HYDROcodone-acetaminophen 7.5-325 MG tablet  Commonly known as:  NORCO  Take 1-2 tablets by mouth every 6 (six) hours as needed for moderate pain.     lisinopril-hydrochlorothiazide 20-25 MG tablet  Commonly known as:  PRINZIDE,ZESTORETIC  Take 1 tablet by mouth daily.     metFORMIN 500 MG 24 hr tablet  Commonly known as:  GLUCOPHAGE-XR  Take 1 tablet by mouth daily.     pravastatin 20 MG tablet  Commonly known as:  PRAVACHOL  Take 1 tablet by mouth daily.     vitamin B-12 1000 MCG tablet  Commonly known as:  CYANOCOBALAMIN  Take 1,000 mcg by mouth daily.     Vitamin  D3 1000 UNITS Caps  Take 1,000 Units by mouth daily.         Physical Exam: Filed Vitals:   08/26/15 1309  BP: 157/79  Pulse: 66  Temp: 97.1 F (36.2 C)  Resp: 18    General- elderly female, obese, in no acute distress Head- normocephalic, atraumatic Nose- normal nasal mucosa, no maxillary or frontal sinus tenderness, no nasal discharge Throat- moist mucus membrane Eyes- PERRLA, EOMI, no pallor, no icterus, no discharge, normal conjunctiva, normal sclera Neck- no cervical lymphadenopathy Cardiovascular- normal s1,s2, no murmurs, palpable dorsalis pedis and  radial pulses, trace left lower leg edema Respiratory- bilateral clear to auscultation, no wheeze, no rhonchi, no crackles, no use of accessory muscles Abdomen- bowel sounds present, soft, non tender Musculoskeletal- able to move all 4 extremities, NWB to LLE, LLE in cast, able to move her toes and has good capillary refill Neurological- no focal deficit, alert and oriented to person, place and time Skin- warm and dry Psychiatry- normal mood and affect    Labs reviewed: Basic Metabolic Panel:  Recent Labs  08/18/15 2356 08/21/15 0333  NA 127* 135  K 4.3 4.0  CL 96* 103  CO2 19* 26  GLUCOSE 130* 119*  BUN 10 9  CREATININE 0.90 0.71  CALCIUM 8.7* 8.1*   Liver Function Tests:  Recent Labs  08/18/15 2356  AST 39  ALT 30  ALKPHOS 42  BILITOT 1.1  PROT 7.2  ALBUMIN 3.3*   No results for input(s): LIPASE, AMYLASE in the last 8760 hours. No results for input(s): AMMONIA in the last 8760 hours. CBC:  Recent Labs  08/18/15 2356 08/21/15 0333  WBC 18.9* 11.8*  NEUTROABS 16.1*  --   HGB 13.1 9.1*  HCT 38.7 28.5*  MCV 93.5 96.3  PLT 289 217   Cardiac Enzymes: No results for input(s): CKTOTAL, CKMB, CKMBINDEX, TROPONINI in the last 8760 hours. BNP: Invalid input(s): POCBNP CBG:  Recent Labs  08/22/15 2120 08/23/15 0632 08/23/15 1224  GLUCAP 113* 119* 114*    Radiological Exams: Dg Ankle Complete Left  08/18/2015  CLINICAL DATA:  Fall with ankle fracture. EXAM: LEFT ANKLE COMPLETE - 3+ VIEW COMPARISON:  None. FINDINGS: Oblique fracture left lateral malleolus. Transverse fracture of the medial malleolar tip. Abnormal widening of the mortise if, which measures up to 10 mm craniocaudad medially and up to 12 mm between the medial malleolus and the talus. The shaft of the fibula and the shaft of the tibia are both displaced medially. I do not see a distinct fracture of the posterior malleolus. Vascular calcifications noted.  Large plantar calcaneal spur. Severe soft  tissue swelling noted. Spurring in the midfoot is present. IMPRESSION: 1. Prominent Weber B fracture with oblique lateral malleolar component, transverse medial malleolar tip component, and medial displacement of the tibia and fibular shaft with respect to the talus. 2. Midfoot degenerative spurring. 3. Considerable vascular calcifications. 4. Considerable soft tissue swelling. Electronically Signed   By: Van Clines M.D.   On: 08/18/2015 23:34   Ct Angio Low Extrem Left W/cm &/or Wo/cm  08/19/2015  CLINICAL DATA:  Status post fall in kitchen, with open left ankle fracture. Assess for vascular injury. Initial encounter. EXAM: CT ANGIOGRAPHY OF THE LEFT LOWER EXTREMITY TECHNIQUE: Multidetector CT imaging of the left lower extremity was performed using the standard protocol during bolus administration of intravenous contrast. Multiplanar CT image reconstructions and MIPs were obtained to evaluate the vascular anatomy. CONTRAST:  130mL OMNIPAQUE IOHEXOL 350 MG/ML SOLN COMPARISON:  Left ankle radiographs performed 08/18/2015 FINDINGS: A minimally displaced distal fibular fracture is noted. No additional fractures are seen. Chronic osseous fragments at the medial malleolus reflect remote injury. The ankle mortise is grossly unremarkable in appearance. There is no evidence of talar tilt or subluxation. There is suggestion of patent three-vessel runoff to the right foot, though this is difficult to fully assess due to the extent of diffuse calcific atherosclerotic disease. There is no evidence of vascular injury. Soft tissue injury is noted at the medial aspect of the right ankle, with an open wound. Diffuse soft tissue swelling is noted about the ankle and dorsum of the foot. The flexor and extensor tendons are grossly unremarkable. There appears to be fluid tracking along the peroneal tendons, raising concern for some degree of tenosynovitis. A small knee joint effusion is noted. Review of the MIP images  confirms the above findings. IMPRESSION: 1. Minimally displaced distal fibular fracture noted. 2. Chronic osseous fragments noted at the medial malleolus, reflecting remote injury. 3. Suggestion of patent three-vessel runoff to the right foot, though this is difficult to fully assess due to the extent of diffuse calcific atherosclerotic disease. No evidence of vascular injury. 4. Soft tissue injury at the medial aspect of the right ankle, with open wound. Diffuse soft tissue swelling noted about the ankle and dorsum of the foot. 5. Fluid tracking along the peroneal tendons, raising question for some degree of tenosynovitis. 6. Small knee joint effusion noted. Electronically Signed   By: Garald Balding M.D.   On: 08/19/2015 01:46   Dg Knee Complete 4 Views Left  08/18/2015  CLINICAL DATA:  Left knee bruising after crawling to another room. Initial encounter. EXAM: LEFT KNEE - COMPLETE 4+ VIEW COMPARISON:  None. FINDINGS: There is no evidence of fracture or dislocation. The joint spaces are preserved. No significant degenerative change is seen; the patellofemoral joint is grossly unremarkable in appearance. No significant joint effusion is seen. Diffuse vascular calcifications are seen. IMPRESSION: 1. No evidence of fracture or dislocation. 2. Diffuse vascular calcifications seen. Electronically Signed   By: Garald Balding M.D.   On: 08/18/2015 23:33     Assessment/Plan  Unsteady gait Post left ankle fracture. S/p surgical repair. Will have patient work with PT/OT as tolerated to regain strength and restore function.  Fall precautions are in place.  Left open ankle fracture S/p ORIF. Has f/u with Dr Erlinda Hong in 1 week. Continue aspirin EC 325 mg bid for dvt prophylaxis. Continue norco 7.5-325 mg 1-2 tab q6h prn pain. Continue vitamin d supplement. Will have her work with physical therapy and occupational therapy team to help with gait training and muscle strengthening exercises.fall precautions. Skin care.  Encourage to be out of bed. NWB to LLE for now  Leukocytosis Afebrile, no signs of infection, likely reactive with her surgical repair. Monitor cbc with diff  Blood loss anemia Post op, monitor h&h  HTN bp on higher side, check BP daily for now. Continue catapres 0.2 mg bid, lisinopril-hctz 20-25 mg daily, check cmp  Constipation Add colace 100 mg daily for now and monitor, hydration encouraged  DM type 2 No results found for: HGBA1C Check a1c. Continue metformin 500 mg daily, monitor cbg daily  HLD On pravastatin 20 mg daily at home. Currently on lipitor 10 mg daily for formulary reason  b12 def Continue b12 1000 mcg daily for now   Goals of care: short term rehabilitation   Labs/tests ordered: cbc with diff, cmp, a1c  Family/ staff  Communication: reviewed care plan with patient and nursing supervisor    Blanchie Serve, MD  [facility] [phone] (Monday-Friday 8 am - 5 pm) [phone] (afterhours)

## 2015-08-27 LAB — HEMOGLOBIN A1C: Hgb A1c MFr Bld: 6 % (ref 4.0–6.0)

## 2015-08-27 LAB — BASIC METABOLIC PANEL
BUN: 20 mg/dL (ref 4–21)
Creatinine: 0.9 mg/dL (ref 0.5–1.1)
Glucose: 131 mg/dL
POTASSIUM: 3.9 mmol/L (ref 3.4–5.3)
Sodium: 139 mmol/L (ref 137–147)

## 2015-08-28 ENCOUNTER — Non-Acute Institutional Stay (SKILLED_NURSING_FACILITY): Payer: Medicare Other | Admitting: Internal Medicine

## 2015-08-28 DIAGNOSIS — I10 Essential (primary) hypertension: Secondary | ICD-10-CM

## 2015-08-28 DIAGNOSIS — K5900 Constipation, unspecified: Secondary | ICD-10-CM

## 2015-08-28 DIAGNOSIS — D62 Acute posthemorrhagic anemia: Secondary | ICD-10-CM | POA: Diagnosis not present

## 2015-08-28 DIAGNOSIS — D72829 Elevated white blood cell count, unspecified: Secondary | ICD-10-CM | POA: Diagnosis not present

## 2015-08-28 NOTE — Progress Notes (Signed)
Patient ID: Tanya Juarez, female   DOB: Feb 21, 1946, 69 y.o.   MRN: [id number]     Facility: [facility]    PCP: Thurman Coyer, MD  Code Status: full code  No Known Allergies  Chief Complaint  Patient presents with  . Acute Visit    low hemoglobin, elevated WBC, low albumin     HPI:  69 y.o. patient is here for acute concerns. She had post hospital follow up lab work which shows low hemoglobin and elevated white blood cell. She also has low albumin level and has poor po intake. She is here for short term rehabilitation post hospital admission with open left ankle fracture and is s/p open reduction and internal fixation. She is seen in her room today. Denies any blood in stool or urine. Denies melena. Denies dizziness or lightheadedness. She does get tired easily. Her Bp reading have been elevated in the facility.  Review of Systems:  Constitutional: Negative for fever, chills, diaphoresis.  HENT: Negative for headache  Eyes: Negative for blurred vision Respiratory: Negative for cough, shortness of breath and wheezing.   Cardiovascular: Negative for chest pain, palpitations.  Gastrointestinal: Negative for heartburn, nausea, vomiting, abdominal pain Genitourinary: Negative for dysuria Neurological: positive for generalized weakness. Negative for dizziness, tingling, focal weakness   Past Medical History  Diagnosis Date  . Hypertension   . Diabetes mellitus without complication ([place])    Medication reviewed. See MAR  Physical Exam: BP 148/89 mmHg  Pulse 74  Temp(Src) 97.2 F (36.2 C)  Resp 16  SpO2 95%  General- elderly female, obese, in no acute distress Head- normocephalic, atraumatic Throat- moist mucus membrane Eyes- PERRLA, EOMI, no pallor, no icterus, no discharge, normal conjunctiva, normal sclera Neck- no cervical lymphadenopathy Cardiovascular- normal s1,s2, no murmurs, palpable dorsalis pedis and radial pulses, trace left lower  leg edema Respiratory- bilateral clear to auscultation, no wheeze, no rhonchi, no crackles, no use of accessory muscles Abdomen- bowel sounds present, soft, non tender Musculoskeletal- able to move all 4 extremities, NWB to LLE, LLE in cast, able to move her toes and has good capillary refill Neurological- no focal deficit, alert and oriented to person, place and time   Labs reviewed: Basic Metabolic Panel:  Recent Labs  08/18/15 2356 08/21/15 0333  NA 127* 135  K 4.3 4.0  CL 96* 103  CO2 19* 26  GLUCOSE 130* 119*  BUN 10 9  CREATININE 0.90 0.71  CALCIUM 8.7* 8.1*   Liver Function Tests:  Recent Labs  08/18/15 2356  AST 39  ALT 30  ALKPHOS 42  BILITOT 1.1  PROT 7.2  ALBUMIN 3.3*   No results for input(s): LIPASE, AMYLASE in the last 8760 hours. No results for input(s): AMMONIA in the last 8760 hours. CBC:  Recent Labs  08/18/15 2356 08/21/15 0333  WBC 18.9* 11.8*  NEUTROABS 16.1*  --   HGB 13.1 9.1*  HCT 38.7 28.5*  MCV 93.5 96.3  PLT 289 217   08/27/15 wbc 11, hb 9.4, hct 29.4, plt 311, na 139, k 3.9, bun 20, cr 0.89, alb 2.88, lft wnl     Assessment/Plan  Anemia Likely post op from blood loss. Remains low as of discharge from the hospital. Add ferrous sulfate 325 mg daily and monitor. Check cbc in 1 week  Leukocytosis Improving. Afebrile. Monitor clinically  HTN bp elevated, check BP daily for now. Continue catapres 0.2 mg bid. Discontinue lisinopril hctz 20-25 mg and start lisinopril 40 mg daily  and HCTZ 25 mg daily. Monitor BP reading and BMP  Constipation With addition on iron, change colace to 100 mg bid and monitor   Labs/tests ordered: cbc with diff 1 week   Blanchie Serve, MD  [facility] [phone] (Monday-Friday 8 am - 5 pm) [phone] (afterhours)

## 2015-09-04 LAB — CBC AND DIFFERENTIAL
HEMATOCRIT: 32 % — AB (ref 36–46)
HEMOGLOBIN: 10.2 g/dL — AB (ref 12.0–16.0)
Platelets: 391 10*3/uL (ref 150–399)
WBC: 12.2 10^3/mL

## 2015-09-08 ENCOUNTER — Non-Acute Institutional Stay (SKILLED_NURSING_FACILITY): Payer: Medicare Other | Admitting: Nurse Practitioner

## 2015-09-08 DIAGNOSIS — D72829 Elevated white blood cell count, unspecified: Secondary | ICD-10-CM | POA: Diagnosis not present

## 2015-09-08 DIAGNOSIS — D62 Acute posthemorrhagic anemia: Secondary | ICD-10-CM

## 2015-09-08 DIAGNOSIS — S82892S Other fracture of left lower leg, sequela: Secondary | ICD-10-CM | POA: Diagnosis not present

## 2015-09-08 DIAGNOSIS — I10 Essential (primary) hypertension: Secondary | ICD-10-CM | POA: Diagnosis not present

## 2015-09-08 DIAGNOSIS — E1149 Type 2 diabetes mellitus with other diabetic neurological complication: Secondary | ICD-10-CM | POA: Diagnosis not present

## 2015-09-08 DIAGNOSIS — K5900 Constipation, unspecified: Secondary | ICD-10-CM | POA: Diagnosis not present

## 2015-09-08 NOTE — Progress Notes (Signed)
Patient ID: Tanya Juarez, female   DOB: Nov 13, 1945, 69 y.o.   MRN: [id number]    Nursing Home Location:  [place] [facility] (31)  PCP: Thurman Coyer, MD  No Known Allergies  Chief Complaint  Patient presents with  . Discharge Note    HPI:  Patient is a 69 y.o. female seen today at [facility] for discharge home. Pt at [facility] after hospitalization admission from 08/18/15-08/23/15 with open left ankle fracture. She underwent open reduction and internal fixation. She has been doing well working with therapy team. She denies any pain at present. Elevated wbc noted on recent lab work. No shortness of breath, cough, congestion, wound or skin infection, dysuria or diarrhea. Pt reports she feels well and no fevers noted.   Review of Systems:  Review of Systems  Constitutional: Negative for activity change, appetite change, fatigue and unexpected weight change.  HENT: Negative for congestion and hearing loss.   Eyes: Negative.   Respiratory: Negative for cough and shortness of breath.   Cardiovascular: Negative for chest pain, palpitations and leg swelling.  Gastrointestinal: Negative for abdominal pain, diarrhea and constipation.  Genitourinary: Negative for dysuria and difficulty urinating.  Musculoskeletal: Negative for myalgias and arthralgias.  Skin: Negative for color change and wound.  Neurological: Negative for dizziness and weakness.  Psychiatric/Behavioral: Negative for behavioral problems, confusion and agitation.    Past Medical History  Diagnosis Date  . Hypertension   . Diabetes mellitus without complication [facility])    Past Surgical History  Procedure Laterality Date  . Cesarean section    . I&d extremity Left 08/19/2015    Procedure: IRRIGATION AND DEBRIDEMENT LEFT ANKLE FRACTURE;  Surgeon: Leandrew Koyanagi, MD;  Location: [place];  Service: Orthopedics;  Laterality: Left;  . Orif ankle fracture Left 08/20/2015    Procedure: OPEN REDUCTION INTERNAL FIXATION (ORIF) LEFT ANKLE FRACTURE, SYNDESMOSIS INJURY;  Surgeon: Leandrew Koyanagi, MD;  Location: [place];  Service: Orthopedics;  Laterality: Left;   Social History:   reports that she has never smoked. She has never used smokeless tobacco. She reports that she drinks alcohol. She reports that she does not use illicit drugs.  Family History  Problem Relation Age of Onset  . Cancer Mother   . Lung cancer Father   . Hypertension      Medications: Patient's Medications  New Prescriptions   No medications on file  Previous Medications   ASPIRIN EC 325 MG TABLET    Take 1 tablet (325 mg total) by mouth 2 (two) times daily.   CHOLECALCIFEROL (VITAMIN D3) 1000 UNITS CAPS    Take 1,000 Units by mouth daily.    CLONIDINE (CATAPRES) 0.2 MG TABLET    Take 0.2 mg by mouth 2 (two) times daily.   HYDROCODONE-ACETAMINOPHEN (NORCO) 7.5-325 MG TABLET    Take 1-2 tablets by mouth every 6 (six) hours as needed for moderate pain.   LISINOPRIL-HYDROCHLOROTHIAZIDE (PRINZIDE,ZESTORETIC) 20-25 MG PER TABLET    Take 1 tablet by mouth daily.   METFORMIN (GLUCOPHAGE-XR) 500 MG 24 HR TABLET    Take 1 tablet by mouth daily.   PRAVASTATIN (PRAVACHOL) 20 MG TABLET    Take 1 tablet by mouth daily.   VITAMIN B-12 (CYANOCOBALAMIN) 1000 MCG TABLET    Take 1,000 mcg by mouth daily.  Modified Medications   No medications on file  Discontinued Medications   No medications on file     Physical Exam:  Filed Vitals:   09/08/15 1555  BP: 126/76  Pulse: 70  Temp: 98.2 F (36.8 C)  Resp: 18    Physical Exam  Constitutional: She is oriented to person, place, and time. She appears well-developed and well-nourished. No distress.  HENT:  Head: Normocephalic and atraumatic.  Mouth/Throat: Oropharynx is clear and moist. No oropharyngeal exudate.  Eyes: Conjunctivae are normal. Pupils are equal, round, and reactive to light.  Neck: Normal range of motion. Neck supple.  Cardiovascular:  Normal rate, regular rhythm and normal heart sounds.   Pulmonary/Chest: Effort normal and breath sounds normal.  Abdominal: Soft. Bowel sounds are normal.  Musculoskeletal: She exhibits no edema or tenderness.  Neurological: She is alert and oriented to person, place, and time.  Skin: Skin is warm and dry. She is not diaphoretic.  NWB to LLE, able to move her toes and has good capillary refill, healing incision   Psychiatric: She has a normal mood and affect.    Labs reviewed: Basic Metabolic Panel:  Recent Labs  08/18/15 2356 08/21/15 0333 08/27/15  NA 127* 135 139  K 4.3 4.0 3.9  CL 96* 103  --   CO2 19* 26  --   GLUCOSE 130* 119*  --   BUN 10 9 20   CREATININE 0.90 0.71 0.9  CALCIUM 8.7* 8.1*  --    Liver Function Tests:  Recent Labs  08/18/15 2356  AST 39  ALT 30  ALKPHOS 42  BILITOT 1.1  PROT 7.2  ALBUMIN 3.3*   No results for input(s): LIPASE, AMYLASE in the last 8760 hours. No results for input(s): AMMONIA in the last 8760 hours. CBC:  Recent Labs  08/18/15 2356 08/21/15 0333 09/04/15  WBC 18.9* 11.8* 12.2  NEUTROABS 16.1*  --   --   HGB 13.1 9.1* 10.2*  HCT 38.7 28.5* 32*  MCV 93.5 96.3  --   PLT 289 217 391   TSH: No results for input(s): TSH in the last 8760 hours. A1C: Lab Results  Component Value Date   HGBA1C 6.0 08/27/2015   Lipid Panel: No results for input(s): CHOL, HDL, LDLCALC, TRIG, CHOLHDL, LDLDIRECT in the last 8760 hours.  Radiological Exams: Dg Ankle Complete Left  08/18/2015  CLINICAL DATA:  Fall with ankle fracture. EXAM: LEFT ANKLE COMPLETE - 3+ VIEW COMPARISON:  None. FINDINGS: Oblique fracture left lateral malleolus. Transverse fracture of the medial malleolar tip. Abnormal widening of the mortise if, which measures up to 10 mm craniocaudad medially and up to 12 mm between the medial malleolus and the talus. The shaft of the fibula and the shaft of the tibia are both displaced medially. I do not see a distinct fracture of  the posterior malleolus. Vascular calcifications noted.  Large plantar calcaneal spur. Severe soft tissue swelling noted. Spurring in the midfoot is present. IMPRESSION: 1. Prominent Weber B fracture with oblique lateral malleolar component, transverse medial malleolar tip component, and medial displacement of the tibia and fibular shaft with respect to the talus. 2. Midfoot degenerative spurring. 3. Considerable vascular calcifications. 4. Considerable soft tissue swelling. Electronically Signed   By: Van Clines M.D.   On: 08/18/2015 23:34   Ct Angio Low Extrem Left W/cm &/or Wo/cm  08/19/2015  CLINICAL DATA:  Status post fall in kitchen, with open left ankle fracture. Assess for vascular injury. Initial encounter. EXAM: CT ANGIOGRAPHY OF THE LEFT LOWER EXTREMITY TECHNIQUE: Multidetector CT imaging of the left lower extremity was performed using the standard protocol during bolus administration of  intravenous contrast. Multiplanar CT image reconstructions and MIPs were obtained to evaluate the vascular anatomy. CONTRAST:  167mL OMNIPAQUE IOHEXOL 350 MG/ML SOLN COMPARISON:  Left ankle radiographs performed 08/18/2015 FINDINGS: A minimally displaced distal fibular fracture is noted. No additional fractures are seen. Chronic osseous fragments at the medial malleolus reflect remote injury. The ankle mortise is grossly unremarkable in appearance. There is no evidence of talar tilt or subluxation. There is suggestion of patent three-vessel runoff to the right foot, though this is difficult to fully assess due to the extent of diffuse calcific atherosclerotic disease. There is no evidence of vascular injury. Soft tissue injury is noted at the medial aspect of the right ankle, with an open wound. Diffuse soft tissue swelling is noted about the ankle and dorsum of the foot. The flexor and extensor tendons are grossly unremarkable. There appears to be fluid tracking along the peroneal tendons, raising concern for  some degree of tenosynovitis. A small knee joint effusion is noted. Review of the MIP images confirms the above findings. IMPRESSION: 1. Minimally displaced distal fibular fracture noted. 2. Chronic osseous fragments noted at the medial malleolus, reflecting remote injury. 3. Suggestion of patent three-vessel runoff to the right foot, though this is difficult to fully assess due to the extent of diffuse calcific atherosclerotic disease. No evidence of vascular injury. 4. Soft tissue injury at the medial aspect of the right ankle, with open wound. Diffuse soft tissue swelling noted about the ankle and dorsum of the foot. 5. Fluid tracking along the peroneal tendons, raising question for some degree of tenosynovitis. 6. Small knee joint effusion noted. Electronically Signed   By: Garald Balding M.D.   On: 08/19/2015 01:46   Dg Knee Complete 4 Views Left  08/18/2015  CLINICAL DATA:  Left knee bruising after crawling to another room. Initial encounter. EXAM: LEFT KNEE - COMPLETE 4+ VIEW COMPARISON:  None. FINDINGS: There is no evidence of fracture or dislocation. The joint spaces are preserved. No significant degenerative change is seen; the patellofemoral joint is grossly unremarkable in appearance. No significant joint effusion is seen. Diffuse vascular calcifications are seen. IMPRESSION: 1. No evidence of fracture or dislocation. 2. Diffuse vascular calcifications seen. Electronically Signed   By: Garald Balding M.D.   On: 08/18/2015 23:33    Assessment/Plan 1. Open left ankle fracture, sequela S/p ORIF. Continue aspirin EC 325 mg bid for dvt prophylaxis. Continue norco 7.5-325 mg  q6h prn pain. Continue vitamin d supplement. NWB to LLE for now and has follow up with ortho  2. Diabetes mellitus type 2 with neurological manifestations (HCC) A1c at goal, conts on metformin 500 mg daily  3. Essential hypertension, benign Blood pressure stable at this time, conts on catapres and lisinopril-hctz   4.  Constipation, unspecified constipation type Well controlled on supplements   5. Leukocytosis Remains unchanged, trending down from hospital. pt to follows up with PCP for CBC   6. Acute blood loss anemia Post-op, hgb improving.    pt is stable for discharge-will need PT/OT/HHA per home health. DME needed includes WC, cushion and drop arm 3-1. Rx written.  will need to follow up with PCP within 1-2 weeks.    Carlos American. Harle Battiest  [facility] [phone] 8 am - 5 pm) [phone] (after hours)

## 2016-01-26 HISTORY — PX: BREAST BIOPSY: SHX20

## 2016-02-18 ENCOUNTER — Other Ambulatory Visit: Payer: Self-pay | Admitting: Radiology

## 2016-02-25 ENCOUNTER — Telehealth: Payer: Self-pay | Admitting: *Deleted

## 2016-02-25 DIAGNOSIS — C50112 Malignant neoplasm of central portion of left female breast: Secondary | ICD-10-CM | POA: Insufficient documentation

## 2016-02-25 NOTE — Telephone Encounter (Signed)
Confirmed [facility] for 03/03/16 at 815am .  Instructions and contact information given.

## 2016-03-03 ENCOUNTER — Other Ambulatory Visit (HOSPITAL_BASED_OUTPATIENT_CLINIC_OR_DEPARTMENT_OTHER): Payer: Medicare Other

## 2016-03-03 ENCOUNTER — Ambulatory Visit
Admission: RE | Admit: 2016-03-03 | Discharge: 2016-03-03 | Disposition: A | Discharge status: Home / Self Care | Payer: Medicare Other | Source: Ambulatory Visit | Attending: Radiation Oncology | Admitting: Radiation Oncology

## 2016-03-03 ENCOUNTER — Ambulatory Visit: Payer: Medicare Other | Attending: Surgery | Admitting: Physical Therapy

## 2016-03-03 ENCOUNTER — Encounter: Payer: Self-pay | Admitting: Physical Therapy

## 2016-03-03 ENCOUNTER — Encounter: Payer: Self-pay | Admitting: Skilled Nursing Facility1

## 2016-03-03 ENCOUNTER — Ambulatory Visit (HOSPITAL_BASED_OUTPATIENT_CLINIC_OR_DEPARTMENT_OTHER): Payer: Medicare Other | Admitting: Oncology

## 2016-03-03 ENCOUNTER — Encounter: Payer: Self-pay | Admitting: *Deleted

## 2016-03-03 ENCOUNTER — Encounter: Payer: Self-pay | Admitting: Oncology

## 2016-03-03 ENCOUNTER — Telehealth: Payer: Self-pay | Admitting: Oncology

## 2016-03-03 VITALS — BP 187/85 | HR 72 | Temp 98.1°F | Resp 18 | Ht 67.0 in | Wt 241.8 lb

## 2016-03-03 DIAGNOSIS — C50112 Malignant neoplasm of central portion of left female breast: Secondary | ICD-10-CM

## 2016-03-03 DIAGNOSIS — D0512 Intraductal carcinoma in situ of left breast: Secondary | ICD-10-CM | POA: Diagnosis not present

## 2016-03-03 DIAGNOSIS — R262 Difficulty in walking, not elsewhere classified: Secondary | ICD-10-CM | POA: Diagnosis present

## 2016-03-03 DIAGNOSIS — M8580 Other specified disorders of bone density and structure, unspecified site: Secondary | ICD-10-CM

## 2016-03-03 DIAGNOSIS — Z803 Family history of malignant neoplasm of breast: Secondary | ICD-10-CM

## 2016-03-03 DIAGNOSIS — C50812 Malignant neoplasm of overlapping sites of left female breast: Secondary | ICD-10-CM | POA: Diagnosis present

## 2016-03-03 DIAGNOSIS — Z801 Family history of malignant neoplasm of trachea, bronchus and lung: Secondary | ICD-10-CM | POA: Diagnosis not present

## 2016-03-03 DIAGNOSIS — R293 Abnormal posture: Secondary | ICD-10-CM | POA: Insufficient documentation

## 2016-03-03 DIAGNOSIS — Z800 Family history of malignant neoplasm of digestive organs: Secondary | ICD-10-CM

## 2016-03-03 DIAGNOSIS — Z808 Family history of malignant neoplasm of other organs or systems: Secondary | ICD-10-CM

## 2016-03-03 LAB — CBC WITH DIFFERENTIAL/PLATELET
BASO%: 0.8 % (ref 0.0–2.0)
Basophils Absolute: 0.1 10*3/uL (ref 0.0–0.1)
EOS%: 0.6 % (ref 0.0–7.0)
Eosinophils Absolute: 0.1 10*3/uL (ref 0.0–0.5)
HCT: 38 % (ref 34.8–46.6)
HEMOGLOBIN: 12.6 g/dL (ref 11.6–15.9)
LYMPH%: 17.3 % (ref 14.0–49.7)
MCH: 30.3 pg (ref 25.1–34.0)
MCHC: 33.1 g/dL (ref 31.5–36.0)
MCV: 91.6 fL (ref 79.5–101.0)
MONO#: 0.9 10*3/uL (ref 0.1–0.9)
MONO%: 8.4 % (ref 0.0–14.0)
NEUT%: 72.9 % (ref 38.4–76.8)
NEUTROS ABS: 7.9 10*3/uL — AB (ref 1.5–6.5)
PLATELETS: 287 10*3/uL (ref 145–400)
RBC: 4.15 10*6/uL (ref 3.70–5.45)
RDW: 14.3 % (ref 11.2–14.5)
WBC: 10.9 10*3/uL — AB (ref 3.9–10.3)
lymph#: 1.9 10*3/uL (ref 0.9–3.3)

## 2016-03-03 LAB — COMPREHENSIVE METABOLIC PANEL
ALBUMIN: 3.2 g/dL — AB (ref 3.5–5.0)
ALK PHOS: 49 U/L (ref 40–150)
ALT: 14 U/L (ref 0–55)
ANION GAP: 8 meq/L (ref 3–11)
AST: 11 U/L (ref 5–34)
BILIRUBIN TOTAL: 0.58 mg/dL (ref 0.20–1.20)
BUN: 13.5 mg/dL (ref 7.0–26.0)
CO2: 24 mEq/L (ref 22–29)
Calcium: 9.5 mg/dL (ref 8.4–10.4)
Chloride: 98 mEq/L (ref 98–109)
Creatinine: 1 mg/dL (ref 0.6–1.1)
EGFR: 70 mL/min/{1.73_m2} — AB (ref >90)
GLUCOSE: 127 mg/dL (ref 70–140)
POTASSIUM: 4.7 meq/L (ref 3.5–5.1)
SODIUM: 130 meq/L — AB (ref 136–145)
TOTAL PROTEIN: 7.8 g/dL (ref 6.4–8.3)

## 2016-03-03 NOTE — Progress Notes (Signed)
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 This foundnew calcifications in the left breastcentral to the nipple On 02/13/2016 the patient had unilateral left diagnostic mammography, which found the breast composition to be category be.In the left breast central to the nipple there were grouped heterogeneous calcifications measuring 2.5 cm.   Biopsy of this area 02/18/2016 showed ([id number])ductal carcinoma in situ, grade 1, with a very small area of microinvasive breast cancer, also grade 1.The ductal carcinoma in situ was estrogen and progesterone receptor positive, both at 90%, both with strong staining intensity.The invasive tumor or was HER-2 not amplified, with a signals ratio 1.31and the number per cell 3.15. There was not sufficient tissue for MIB-1  On 02/27/2016 the patient underwent left axillary ultrasound, which was normal.  Her subsequent history is as detailed below.  INTERVAL HISTORY: Tanya Juarez was evaluated in the multidisciplinary breast cancer clinic on 03/03/2016 accompanied by her sister Tanya Juarez and by her daughter-in-law Tanya Juarez. Her case was also presented in the multidisciplinary breast cancer conference that same morning. At that time a preliminary plan was proposed:resident's conserving surgery with sentinel lymph node sampling, no chemotherapy, then adjuvant radiation and  adjuvant hormone treatment  REVIEW OF SYSTEMS: There were no specific symptoms leading to the original mammogram, which was routinely scheduled. The patient denies unusual headaches, visual changes, nausea, vomiting, stiff neck, dizziness, or gait imbalance. There has been no cough, phlegm production, or pleurisy, no chest pain or pressure, and no change in bowel or bladder habits. The patient denies fever, rash, bleeding, unexplained fatigue or unexplained weight loss. A detailed review of systems was otherwise entirely negative.  PAST MEDICAL HISTORY: Past Medical History  Diagnosis Date  . Hypertension   . Diabetes mellitus without complication ([place])   . Breast cancer ([place])   . Hyperlipidemia   . Neuropathy ([place])     PAST SURGICAL HISTORY: Past Surgical History  Procedure Laterality Date  . Cesarean section    . I&d extremity Left 08/19/2015    Procedure: IRRIGATION AND DEBRIDEMENT LEFT ANKLE FRACTURE;  Surgeon: Tanya Koyanagi, MD;  Location: [facility];  Service: Orthopedics;  Laterality: Left;  . Orif ankle fracture Left 08/20/2015    Procedure: OPEN REDUCTION INTERNAL FIXATION (ORIF) LEFT ANKLE FRACTURE, SYNDESMOSIS INJURY;  Surgeon: Tanya Koyanagi, MD;  Location: [place];  Service: Orthopedics;  Laterality: Left;    FAMILY HISTORY Family History  Problem Relation Age of Onset  . Liver cancer Mother   . Lung cancer Father   . Hypertension    the patient's father died at the age of 22 possibly from cancer. The patient tells me he was of)" they didn't tell him what he had". The patient's mother died from pancreatic cancer metastatic to the [place] age 21. The patient had 4 brothers and 4 sisters. One sister was diagnosed with breast cancer at age 57. This metastasizedand eventually took her life. There is no other history of breast cancer in the family and no  history of ovarian cancer.  GYNECOLOGIC HISTORY:  No LMP recorded. Patient is postmenopausal. Menarche age 29, first live birthage  49, the patient is GX P68 She stopped having periods in her early 68s. She did not take hormone replacement. She never used oral contraception.  SOCIAL HISTORY:  The patient used to work at [employer]. She is now retired. She is a widow. She lives by herself, with no pets. Her sons are A, who lives in [place] and works as a Administrator, and Lennette Bihari, lives in [place], and works as a Consulting civil engineer. The patient has 6 grandchildren. She is a Tourist information centre manager.    ADVANCED DIRECTIVES: not in place   HEALTH MAINTENANCE: Social History  Substance Use Topics  . Smoking status: Never Smoker   . Smokeless tobacco: Never Used  . Alcohol Use: 1.2 oz/week    2 Glasses of wine per week     Comment: occasionally     Colonoscopy: never  PAP:  Bone density:[facility]05/08/2016, with a lowest T score of -1.2  Lipid panel:  No Known Allergies  Current Outpatient Prescriptions  Medication Sig Dispense Refill  . aspirin EC 325 MG tablet Take 1 tablet (325 mg total) by mouth 2 (two) times daily. 84 tablet 0  . Cholecalciferol (VITAMIN D3) 1000 UNITS CAPS Take 1,000 Units by mouth daily.     . cloNIDine (CATAPRES) 0.2 MG tablet Take 0.2 mg by mouth 2 (two) times daily.    [place] Kitchen lisinopril-hydrochlorothiazide (PRINZIDE,ZESTORETIC) 20-25 MG per tablet Take 1 tablet by mouth daily.    . metFORMIN (GLUCOPHAGE-XR) 500 MG 24 hr tablet Take 1 tablet by mouth daily.    . pravastatin (PRAVACHOL) 20 MG tablet Take 1 tablet by mouth daily.    . vitamin B-12 (CYANOCOBALAMIN) 1000 MCG tablet Take 1,000 mcg by mouth daily.     No current facility-administered medications for this visit.    OBJECTIVE: middle-aged African-American woman who appears older than stated age 70 Vitals:   03/03/16 0835  BP: 187/85  Pulse: 72  Temp: 98.1 F (36.7 C)  Resp: 18     Body mass index is 37.86 kg/(m^2).    ECOG FS:1 - Symptomatic but completely ambulatory  Ocular: Sclerae unicteric, pupils equal, round and reactive  to light Ear-nose-throat: Oropharynx clear and moist Lymphatic: No cervical or supraclavicular adenopathy Lungs no rales or rhonchi, good excursion bilaterally Heart regular rate and rhythm, no murmur appreciated Abd soft, bese,nontender, positive bowel sounds MSK no focal spinal tenderness, no joint edema Neuro: non-focal, well-oriented, appropriate affect Breasts: the right breast is unremarkable. The left breast is status post recent biopsy. I do not palpate a mass or skin or nipple finding of concern.The left axilla is benign.   LAB RESULTS:  CMP     Component Value Date/Time   NA 130* 03/03/2016 0820   NA 139 08/27/2015   NA 135 08/21/2015 0333   K 4.7 03/03/2016 0820   K 3.9 08/27/2015   CL 103 08/21/2015 0333   CO2 24 03/03/2016 0820   CO2 26 08/21/2015 0333   GLUCOSE 127 03/03/2016 0820   GLUCOSE 119* 08/21/2015 0333   BUN 13.5 03/03/2016 0820   BUN 20 08/27/2015   BUN 9 08/21/2015 0333   CREATININE 1.0 03/03/2016 0820   CREATININE 0.9 08/27/2015   CREATININE 0.71 08/21/2015 0333   CALCIUM 9.5 03/03/2016 0820   CALCIUM 8.1* 08/21/2015 0333   PROT 7.8 03/03/2016 0820   PROT 7.2 08/18/2015 2356   ALBUMIN 3.2*  03/03/2016 0820   ALBUMIN 3.3* 08/18/2015 2356   AST 11 03/03/2016 0820   AST 39 08/18/2015 2356   ALT 14 03/03/2016 0820   ALT 30 08/18/2015 2356   ALKPHOS 49 03/03/2016 0820   ALKPHOS 42 08/18/2015 2356   BILITOT 0.58 03/03/2016 0820   BILITOT 1.1 08/18/2015 2356   GFRNONAA >60 08/21/2015 0333   GFRAA >60 08/21/2015 0333    INo results found for: SPEP, UPEP  Lab Results  Component Value Date   WBC 10.9* 03/03/2016   NEUTROABS 7.9* 03/03/2016   HGB 12.6 03/03/2016   HCT 38.0 03/03/2016   MCV 91.6 03/03/2016   PLT 287 03/03/2016      Chemistry      Component Value Date/Time   NA 130* 03/03/2016 0820   NA 139 08/27/2015   NA 135 08/21/2015 0333   K 4.7 03/03/2016 0820   K 3.9 08/27/2015   CL 103 08/21/2015 0333   CO2 24 03/03/2016 0820     CO2 26 08/21/2015 0333   BUN 13.5 03/03/2016 0820   BUN 20 08/27/2015   BUN 9 08/21/2015 0333   CREATININE 1.0 03/03/2016 0820   CREATININE 0.9 08/27/2015   CREATININE 0.71 08/21/2015 0333   GLU 131 08/27/2015      Component Value Date/Time   CALCIUM 9.5 03/03/2016 0820   CALCIUM 8.1* 08/21/2015 0333   ALKPHOS 49 03/03/2016 0820   ALKPHOS 42 08/18/2015 2356   AST 11 03/03/2016 0820   AST 39 08/18/2015 2356   ALT 14 03/03/2016 0820   ALT 30 08/18/2015 2356   BILITOT 0.58 03/03/2016 0820   BILITOT 1.1 08/18/2015 2356       No results found for: LABCA2  No components found for: LABCA125  No results for input(s): INR in the last 168 hours.  Urinalysis No results found for: COLORURINE, APPEARANCEUR, LABSPEC, PHURINE, GLUCOSEU, HGBUR, BILIRUBINUR, KETONESUR, PROTEINUR, UROBILINOGEN, NITRITE, LEUKOCYTESUR    ELIGIBLE FOR AVAILABLE RESEARCH PROTOCOL: no  STUDIES: utside films discussed.  ASSESSMENT: 70 y.o.   (1) breast conserving surgery with sentinel lymph node sampling  (2) adjuvant radiation to follow  (3) anti-estrogens to follow at the completion of local treatment  (4) the patient does not meet criteria for genetics testing  PLAN: We spent the better part of today's hour-long appointment discussing the biology of breast cancer in general, and the specifics of the patient's tumor in particular. The patient understands that in noninvasive ductal carcinoma, also called ductal carcinoma in situ ("DCIS") the breast cancer cells remain trapped in the ducts were they started. They cannot travel to a vital organ. For that reason these cancers in themselves are not life-threatening.  If the whole breast is removed then all the ducts are removed and since the cancer cells are trapped in the ducts, the cure rate with mastectomy for noninvasive breast cancer is approximately 99%. Nevertheless we recommend lumpectomy, because there is no survival advantage to mastectomy and  because the cosmetic result is generally superior with breast conservation.  Since the patient is keeping her breasts, there will be some risk of recurrence. The recurrence can only be in the same breast since, again, the cells are trapped in the ducts. The risk of local recurrence is cut by more than half with radiation, which is standard in this situation.  In estrogen receptor positive cancers like Briceyda's, anti-estrogens can also be considered. They will further reduce the risk of recurrence by one half. In addition anti-estrogens will lower the risk  of a new breast cancer developing in either breast also by one half. That risk approaches 1% per year. Anti-estrogens reduce it to 1/2%.  The patient tells me she has been found to have "weak bones" in fact her bone density 02/06/2016 showed the lowest T score to be -1.2, which is mild osteopenia. I think she would be a good candidate for either tamoxifen or aromatase inhibitors. We will discuss that further when she returns to see me in 3 months.  Accordingly the Juarez plan is for surgery, then radiation, then a discussion of anti-estrogens.  The patient has a good understanding of the Juarez plan. She agrees with it. She knows the goal of treatment in her case is cure. She will call with any problems that may develop before her next visit here.  Tanya Cruel, MD   03/03/2016 10:16 AM Medical Oncology and Hematology [facility] [address] [place], [place] [address] Tel. [phone]    Fax. [phone]

## 2016-03-03 NOTE — Telephone Encounter (Signed)
lvm to inform patient of next appt with GM 9/7 at 11 am

## 2016-03-03 NOTE — Patient Instructions (Signed)

## 2016-03-03 NOTE — Therapy (Signed)
[place], [place], [address] Phone: [phone]   Fax:  [phone]  Physical Therapy Evaluation  Patient Details  Name: Tanya Juarez MRN: [id number] Date of Birth: 1946-04-02 Referring Provider: Dr. Alphonsa Overall  Encounter Date: 03/03/2016      PT End of Session - 03/03/16 1309    Visit Number 1   Number of Visits 1   PT Start Time 1122   PT Stop Time 1142   PT Time Calculation (min) 20 min   Activity Tolerance Patient tolerated treatment well   Behavior During Therapy [facility] for tasks assessed/performed      Past Medical History  Diagnosis Date  . Hypertension   . Diabetes mellitus without complication ([place])   . Breast cancer ([place])   . Hyperlipidemia   . Neuropathy [facility])     Past Surgical History  Procedure Laterality Date  . Cesarean section    . I&d extremity Left 08/19/2015    Procedure: IRRIGATION AND DEBRIDEMENT LEFT ANKLE FRACTURE;  Surgeon: Leandrew Koyanagi, MD;  Location: [facility];  Service: Orthopedics;  Laterality: Left;  . Orif ankle fracture Left 08/20/2015    Procedure: OPEN REDUCTION INTERNAL FIXATION (ORIF) LEFT ANKLE FRACTURE, SYNDESMOSIS INJURY;  Surgeon: Leandrew Koyanagi, MD;  Location: [facility];  Service: Orthopedics;  Laterality: Left;    There were no vitals filed for this visit.       Subjective Assessment - 03/03/16 1436    Subjective Patient reports she is here today to be seen for her newly diagnosed left breast cancer.   Patient is accompained by: Family member   Pertinent History Patient was diagnosed on 01/26/16 with left invasive ductal carcinoma with DCIS.  There is only 2 mm of invasive disease but the entire mass measures 2.5 cm.  It is ER/PR positive and HER2 negative and is located in the central portion of her breast in the retroareolar area.   Patient Stated Goals Reduce lymphedema risk and learn post op shoulder ROM HEP            [facility] PT Assessment -  03/03/16 0001    Assessment   Medical Diagnosis Left breast cancer   Referring Provider Dr. Alphonsa Overall   Onset Date/Surgical Date 01/26/16   Hand Dominance Right   Prior [place] PT from 11/16-3/17 for left ankle fracture   Precautions   Precautions Other (comment);Fall   Precaution Comments Active breast cancer; ambulates with front wheel walker due to    Restrictions   Weight Bearing Restrictions No   Balance Screen   Has the patient fallen in the past 6 months No   Has the patient had a decrease in activity level because of a fear of falling?  No   Is the patient reluctant to leave their home because of a fear of falling?  No   Home Environment   Living Environment Private residence   Living Arrangements Alone   Available Help at Discharge Family   Prior Function   Level of Independence Independent with community mobility with device   Vocation Retired   Leisure She continues to do her home exercise program given by home health PT for her ankle fracture   Cognition   Overall Cognitive Status Within Functional Limits for tasks assessed   Posture/Postural Control   Posture/Postural Control Postural limitations   Postural Limitations Rounded Shoulders;Forward head;Increased thoracic kyphosis   ROM / Strength   AROM / PROM / Strength  AROM;Strength   AROM   AROM Assessment Site Shoulder;Cervical   Right/Left Shoulder Right;Left   Right Shoulder Extension 58 Degrees   Right Shoulder Flexion 124 Degrees   Right Shoulder ABduction 152 Degrees   Right Shoulder Internal Rotation 80 Degrees   Right Shoulder External Rotation 75 Degrees   Left Shoulder Extension 50 Degrees   Left Shoulder Flexion 120 Degrees   Left Shoulder ABduction 120 Degrees   Left Shoulder Internal Rotation 57 Degrees   Left Shoulder External Rotation 80 Degrees   Cervical Flexion WNL   Cervical Extension WNL   Cervical - Right Side Bend WNL   Cervical - Left Side Bend WNL   Cervical - Right  Rotation WNL   Cervical - Left Rotation WNL   Strength   Overall Strength Other (comment)  Did not assess shoulder strength but appears to be functiona           LYMPHEDEMA/ONCOLOGY QUESTIONNAIRE - 03/03/16 1307    Type   Cancer Type Left breast cancer   Lymphedema Assessments   Lymphedema Assessments Upper extremities   Right Upper Extremity Lymphedema   10 cm Proximal to Olecranon Process 29.2 cm   Olecranon Process 23.8 cm   10 cm Proximal to Ulnar Styloid Process 20 cm   Just Proximal to Ulnar Styloid Process 16 cm   Across Hand at [employer] 18.9 cm   At [place] of 2nd Digit 6.7 cm   Left Upper Extremity Lymphedema   10 cm Proximal to Olecranon Process 31.7 cm   Olecranon Process 23.4 cm   10 cm Proximal to Ulnar Styloid Process 19.4 cm   Just Proximal to Ulnar Styloid Process 15 cm   Across Hand at [employer] 18.2 cm   At [place] of 2nd Digit 6.3 cm      Patient was instructed today in a home exercise program today for post op shoulder range of motion. These included active assist shoulder flexion in sitting, scapular retraction, wall walking with shoulder abduction, and hands behind head external rotation.  She was encouraged to do these twice a day, holding 3 seconds and repeating 5 times when permitted by her physician.         PT Education - 03/03/16 1309    Education provided Yes   Education Details Lymphedema risk reduction and post op shoulder ROM HEP   Person(s) Educated Patient   Methods Explanation;Demonstration;Handout   Comprehension Returned demonstration;Verbalized understanding              Breast Clinic Goals - 03/03/16 1446    Patient will be able to verbalize understanding of pertinent lymphedema risk reduction practices relevant to her diagnosis specifically related to skin care.   Time 1   Period Days   Status Achieved   Patient will be able to return demonstrate and/or verbalize understanding of the post-op home exercise program  related to regaining shoulder range of motion.   Time 1   Period Days   Status Achieved   Patient will be able to verbalize understanding of the importance of attending the postoperative After Breast Cancer Class for further lymphedema risk reduction education and therapeutic exercise.   Time 1   Period Days   Status Achieved              Plan - 03/03/16 1436    Clinical Impression Statement Patient was diagnosed on 01/26/16 with left invasive ductal carcinoma with DCIS.  There is only 2 mm of invasive  disease but the entire mass measures 2.5 cm.  It is ER/PR positive and HER2 negative and is located in the central portion of her breast in the retroareolar area.  She is planning to have a left lumpectomy and sentinel node biopsy followed by radiation and anti-estrogen therapy.  She may benefit from post op PT to regain shoulder ROM and reduce lymphedema strength.  Her multidisciplinary medical team met prior to her assessment to determine a recommended treatment plan.  Due to her bilateral lower extremity neuropathy increasing risk of falls, ambulating with a rolling walker, a recent ankle fracture, and living alone without support in her home, these factors will impact her rehab potential. Her condition is evolving as she has active cancer and is yet to undergo treatment which contributes to hre evaluation being of moderate complexity.   Rehab Potential Excellent   Clinical Impairments Affecting Rehab Potential none   PT Frequency One time visit   PT Treatment/Interventions Therapeutic exercise;Patient/family education   PT Next Visit Plan Will f/u after surgery   PT Home Exercise Plan Post op shoulder ROM HEP   Consulted and Agree with Plan of Care Patient;Family member/caregiver   Family Member Consulted daughter and sister      Patient will benefit from skilled therapeutic intervention in order to improve the following deficits and impairments:  Decreased strength, Decreased knowledge  of precautions, Pain, Impaired UE functional use, Decreased range of motion  Visit Diagnosis: Malignant neoplasm of overlapping sites of left female breast ([place]) - Plan: PT plan of care cert/re-cert  Abnormal posture - Plan: PT plan of care cert/re-cert  Difficulty in walking, not elsewhere classified - Plan: PT plan of care cert/re-cert  Patient will follow up at outpatient cancer rehab if needed following surgery.  If the patient requires physical therapy at that time, a specific plan will be dictated and sent to the referring physician for approval. The patient was educated today on appropriate basic range of motion exercises to begin post operatively and the importance of attending the After Breast Cancer class following surgery.  Patient was educated today on lymphedema risk reduction practices as it pertains to recommendations that will benefit the patient immediately following surgery.  She verbalized good understanding.  No additional physical therapy is indicated at this time.        G-Codes - 2016/03/26 1447    Functional Assessment Tool Used Clinical Judgement   Functional Limitation Other PT primary   Other PT Primary Current Status (E3662) At least 1 percent but less than 20 percent impaired, limited or restricted   Other PT Primary Goal Status (H4765) At least 1 percent but less than 20 percent impaired, limited or restricted   Other PT Primary Discharge Status (Y6503) At least 1 percent but less than 20 percent impaired, limited or restricted       Problem List Patient Active Problem List   Diagnosis Date Noted  . Cancer of overlapping sites of left breast ([place]) March 26, 2016  . Cancer of central portion of left female breast ([place]) 02/25/2016  . Open left ankle fracture 08/19/2015  . Fracture 08/19/2015  . Gait difficulty 04/13/2013  . Diabetic neuropathy ([place]) 04/13/2013  . B12 deficiency 04/13/2013  . Lumbar radiculopathy 04/13/2013    Annia Friendly,  PT 2016-03-26 2:49 PM  [place], [place], [address] Phone: [phone]   Fax:  [phone]  Name: Tanya Juarez MRN: [id number] Date of Birth: 08-Dec-1945

## 2016-03-03 NOTE — Progress Notes (Signed)
Subjective:     Patient ID: Tanya Juarez, female   DOB: August 27, 1946, 70 y.o.   MRN: [id number]  HPI   Review of Systems     Objective:   Physical Exam For the patient to understand and be given the tools to implement a healthy plant based diet during their cancer diagnosis.     Assessment:     Patient was seen today and found to be pleasant and accompanied by her family. Pts ht 67 in, 241 pounds, BMI 38. Pts medications lisinopril, pravastatin, vitamin B12, metformin. Pt likes meat and does not want to try "all that fancy stuff: referring to soy and quinoa. Pts daughter states she will get her try new things. Pt does not like change and prefers to keep it "old school". That being said she seems open to trying to be healthier.       Plan:     Dietitian educated the patient on implementing a plant based diet by incorporating more plant proteins, fruits, and vegetables. As a part of a healthy routine physical activity was discussed. The importance of legitimate, evidence based information was discussed and examples were given. A folder of evidence based information with a focus on a plant based diet and general nutrition during cancer was given to the patient.  As a part of the continuum of care the cancer dietitian's contact information was given to the patient in the event they would like to have a follow up appointment.

## 2016-03-03 NOTE — Progress Notes (Signed)
Clinical Social Work [place] Psychosocial Distress Screening [place]  Patient completed distress screening protocol and scored a 1 on the Psychosocial Distress Thermometer which indicates mild distress. Clinical Social Worker met with patient and patients family in [facility] to assess for distress and other psychosocial needs. Patient stated she was feeling confident in her treatment plan and treatment team. CSW and patient discussed common feeling and emotions when being diagnosed with cancer, and the importance of support during treatment. CSW informed patient of the support team and support services at [facility]. CSW provided contact information and encouraged patient to call with any questions or concerns.  ONCBCN DISTRESS SCREENING 03/03/2016  Screening Type Initial Screening  Distress experienced in past week (1-10) 1  Physical Problem type Swollen arms/legs  Physician notified of physical symptoms Yes   Johnnye Lana, MSW, LCSW, OSW-C Clinical Social Worker [place] [phone]

## 2016-03-08 ENCOUNTER — Other Ambulatory Visit: Payer: Self-pay | Admitting: Surgery

## 2016-03-08 DIAGNOSIS — C50912 Malignant neoplasm of unspecified site of left female breast: Secondary | ICD-10-CM

## 2016-03-10 ENCOUNTER — Encounter: Payer: Self-pay | Admitting: Radiation Oncology

## 2016-03-10 NOTE — Progress Notes (Signed)
Radiation Oncology         [phone] ________________________________  Name: Tanya Juarez MRN: [id number]  Date: 03/03/2016  DOB: [birth year]  NW:GNFAOZH,YQMVH L, MD  Alphonsa Overall, MD     REFERRING PHYSICIAN: Alphonsa Overall, MD   DIAGNOSIS: The encounter diagnosis was Cancer of central portion of left female breast [facility]).  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 female who is seen for an initial consultation visit regarding the patient's diagnosis of breast cancer.  The patient was found to have suspicious findings within the left breast on initial mammogram.  This corresponded to calcifications corresponding to an area of 2.5 cm in the retroareolar region. The patient has not had symptoms prior to this study. A diagnostic mammogram and breast ultrasound confirmed this finding. No suspicious findings were seen within the axilla on ultrasound.  A biopsy was performed. 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 Receptors studies were completed and indicate that the tumor is estrogen receptor positive, progesterone receptor positive, and Her-2/neu negative with respect to the small focus of invasive ductal carcinoma.   The patient has not undergone an MRI scan of the breasts: This does not appear to be warranted at this time.    PREVIOUS RADIATION THERAPY: No   PAST MEDICAL HISTORY:  has a past medical history of Hypertension; Diabetes mellitus without complication ([place]); Breast cancer ([place]); Hyperlipidemia; and Neuropathy ([place]).     PAST SURGICAL  HISTORY: Past Surgical History  Procedure Laterality Date  . Cesarean section    . I&d extremity Left 08/19/2015    Procedure: IRRIGATION AND DEBRIDEMENT LEFT ANKLE FRACTURE;  Surgeon: Leandrew Koyanagi, MD;  Location: [place];  Service: Orthopedics;  Laterality: Left;  . Orif ankle fracture Left 08/20/2015    Procedure: OPEN REDUCTION INTERNAL FIXATION (ORIF) LEFT ANKLE FRACTURE, SYNDESMOSIS INJURY;  Surgeon: Leandrew Koyanagi, MD;  Location: [place];  Service: Orthopedics;  Laterality: Left;     FAMILY HISTORY: family history includes Liver cancer in her mother; Lung cancer in her father.   SOCIAL HISTORY:  reports that she has never smoked. She has never used smokeless tobacco. She reports that she drinks about 1.2 oz of alcohol per week. She reports that she does not use illicit drugs.   ALLERGIES: Review of patient's allergies indicates no known allergies.   MEDICATIONS:  Current Outpatient Prescriptions  Medication Sig Dispense Refill  . aspirin EC 325 MG tablet Take 1 tablet (325 mg total) by mouth 2 (two) times daily. 84 tablet 0  . Cholecalciferol (VITAMIN D3) 1000 UNITS CAPS Take 1,000 Units by mouth daily.     . cloNIDine (CATAPRES) 0.2 MG tablet Take 0.2 mg by mouth 2 (two) times daily.    Marland Kitchen lisinopril-hydrochlorothiazide (PRINZIDE,ZESTORETIC) 20-25 MG per tablet Take 1 tablet by mouth daily.    . metFORMIN (GLUCOPHAGE-XR) 500 MG 24 hr tablet Take 1 tablet by mouth daily.    . pravastatin (PRAVACHOL) 20 MG tablet Take 1 tablet by mouth daily.    Marland Kitchen  vitamin B-12 (CYANOCOBALAMIN) 1000 MCG tablet Take 1,000 mcg by mouth daily.     No current facility-administered medications for this encounter.     REVIEW OF SYSTEMS:  A 15 point review of systems is documented in the electronic medical record. This was obtained by the nursing staff. However, I reviewed this with the patient to discuss relevant findings and make appropriate changes.  Pertinent items are noted in HPI.    PHYSICAL EXAM:   vitals were not taken for this visit.  ECOG = 1  0 - Asymptomatic (Fully active, able to carry on all predisease activities without restriction)  1 - Symptomatic but completely ambulatory (Restricted in physically strenuous activity but ambulatory and able to carry out work of a light or sedentary nature. For example, light housework, office work)  2 - Symptomatic, <50% in bed during the day (Ambulatory and capable of all self care but unable to carry out any work activities. Up and about more than 50% of waking hours)  3 - Symptomatic, >50% in bed, but not bedbound (Capable of only limited self-care, confined to bed or chair 50% or more of waking hours)  4 - Bedbound (Completely disabled. Cannot carry on any self-care. Totally confined to bed or chair)  5 - Death   Eustace Pen MM, Creech RH, Tormey DC, et al. [phone]). "Toxicity and response criteria of the [facility]". [place] Oncol. 5 (6): 649-55  General: Well-developed, in no acute distress HEENT: Normocephalic, atraumatic; oral cavity clear Neck: Supple without any lymphadenopathy Cardiovascular: Regular rate and rhythm Respiratory: Clear to auscultation bilaterally Breasts: The patient has large breasts. A small biopsy site is present with a palpable mass in the retroareolar region on the left. No axillary adenopathy present. GI: Soft, nontender, normal bowel sounds Extremities: No edema present Neuro: No focal deficits     LABORATORY DATA:  Lab Results  Component Value Date   WBC 10.9* 03/03/2016   HGB 12.6 03/03/2016   HCT 38.0 03/03/2016   MCV 91.6 03/03/2016   PLT 287 03/03/2016   Lab Results  Component Value Date   NA 130* 03/03/2016   K 4.7 03/03/2016   CL 103 08/21/2015   CO2 24 03/03/2016   Lab Results  Component Value Date   ALT 14 03/03/2016   AST 11 03/03/2016   ALKPHOS 49 03/03/2016   BILITOT 0.58 03/03/2016      RADIOGRAPHY: No results found.     IMPRESSION:    Cancer  of overlapping sites of left breast ([place])   02/18/2016 Pathology Results Breast, left, needle core biopsy - INVASIVE DUCTAL CARCINOMA. 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 She appears to be a good candidate for breast conservation treatment.  I discussed with the patient the role of adjuvant radiation treatment in this setting. We discussed the potential benefit of radiation treatment, especially with regards to local control of the patient's tumor. We also discussed the possible side effects and risks of such a treatment as well.  All of the patient's questions were answered. The patient wishes to proceed with radiation treatment at the appropriate time. No plans for  chemotherapy. The patient will benefit from anti-hormonal treatment.  PLAN: I look forward to seeing the patient postoperatively to review her case and further discuss and coordinate an anticipated course of radiation treatment.       ________________________________   Jodelle Gross, MD, PhD   **Disclaimer: This note was dictated with voice recognition software. Similar sounding words can inadvertently be transcribed and this note may contain transcription errors which may not have been corrected upon publication of note.**

## 2016-03-11 ENCOUNTER — Telehealth: Payer: Self-pay | Admitting: *Deleted

## 2016-03-11 NOTE — Telephone Encounter (Signed)
Denies questions or concerns regarding dx or treatment care plan form [place] discussion on 03/03/16. Informed pt once we have her sx date, we will get her in to see Dr. Lisbeth Renshaw afterwards. Received verbal understanding. Encourage pt to call with needs.

## 2016-04-13 NOTE — Pre-Procedure Instructions (Signed)
Tanya Juarez  04/13/2016      [facility] ([facility] [place] [id number] [place] ([place]) [place] [address] Phone: [phone] Fax: [phone]    Your procedure is scheduled on Thursday, July 27th, 2017.  Report to [facility] Admitting at 7:00 A.M.   Call this number if you have problems the morning of surgery:  [phone]   Remember:  Do not eat food or drink liquids after midnight.   Take these medicines the morning of surgery with A SIP OF WATER: Clonidine (Catapres).  7 days prior to surgery, stop taking: Aspirin, NSAIDS, Aleve, Naproxen, Ibuprofen, Advil, Motrin, BC's, Goody's, Fish oil, all herbal medications, and all vitamins.   WHAT DO I DO ABOUT MY DIABETES MEDICATION?  Marland Kitchen Do not take oral diabetes medicines (pills) the morning of surgery.  Do NOT take Metformin the morning of surgery.     Do not wear jewelry, make-up or nail polish.  Do not wear lotions, powders, or perfumes.  You may NOT wear deoderant.  Do not shave 48 hours prior to surgery.    Do not bring valuables to the hospital.  [facility] is not responsible for any belongings or valuables.  Contacts, dentures or bridgework may not be worn into surgery.  Leave your suitcase in the car.  After surgery it may be brought to your room.  For patients admitted to the hospital, discharge time will be determined by your treatment team.  Patients discharged the day of surgery will not be allowed to drive home.   Special instructions:  Preparing for Surgery  Please read over the following fact sheets that you were given.   [place]- Preparing For Surgery  Before surgery, you can play an important role. Because skin is not sterile, your skin needs to be as free of germs as possible. You can reduce the number of germs on your skin by washing with CHG (chlorahexidine gluconate) Soap before surgery.  CHG is an antiseptic cleaner which kills germs and  bonds with the skin to continue killing germs even after washing.  Please do not use if you have an allergy to CHG or antibacterial soaps. If your skin becomes reddened/irritated stop using the CHG.  Do not shave (including legs and underarms) for at least 48 hours prior to first CHG shower. It is OK to shave your face.  Please follow these instructions carefully.   1. Shower the NIGHT BEFORE SURGERY and the MORNING OF SURGERY with CHG.   2. If you chose to wash your hair, wash your hair first as usual with your normal shampoo.  3. After you shampoo, rinse your hair and body thoroughly to remove the shampoo.  4. Use CHG as you would any other liquid soap. You can apply CHG directly to the skin and wash gently with a scrungie or a clean washcloth.   5. Apply the CHG Soap to your body ONLY FROM THE NECK DOWN.  Do not use on open wounds or open sores. Avoid contact with your eyes, ears, mouth and genitals (private parts). Wash genitals (private parts) with your normal soap.  6. Wash thoroughly, paying special attention to the area where your surgery will be performed.  7. Thoroughly rinse your body with warm water from the neck down.  8. DO NOT shower/wash with your normal soap after using and rinsing off the CHG Soap.  9. Pat yourself dry with a CLEAN TOWEL.  10. Wear CLEAN PAJAMAS   11. Place CLEAN SHEETS on your bed the night of your first shower and DO NOT SLEEP WITH PETS.  Day of Surgery: Do not apply any deodorants/lotions. Please wear clean clothes to the hospital/surgery center.     How to Manage Your Diabetes Before and After Surgery  Why is it important to control my blood sugar before and after surgery? . Improving blood sugar levels before and after surgery helps healing and can limit problems. . A way of improving blood sugar control is eating a healthy diet by: o  Eating less sugar and carbohydrates o  Increasing activity/exercise o  Talking with your doctor about  reaching your blood sugar goals . High blood sugars (greater than 180 mg/dL) can raise your risk of infections and slow your recovery, so you will need to focus on controlling your diabetes during the weeks before surgery. . Make sure that the doctor who takes care of your diabetes knows about your planned surgery including the date and location.  How do I manage my blood sugar before surgery? . Check your blood sugar at least 4 times a day, starting 2 days before surgery, to make sure that the level is not too high or low. o Check your blood sugar the morning of your surgery when you wake up and every 2 hours until you get to the Short Stay unit. . If your blood sugar is less than 70 mg/dL, you will need to treat for low blood sugar: o Do not take insulin. o Treat a low blood sugar (less than 70 mg/dL) with  cup of clear juice (cranberry or apple), 4 glucose tablets, OR glucose gel. o Recheck blood sugar in 15 minutes after treatment (to make sure it is greater than 70 mg/dL). If your blood sugar is not greater than 70 mg/dL on recheck, call [phone] for further instructions. . Report your blood sugar to the short stay nurse when you get to Short Stay.  . If you are admitted to the hospital after surgery: o Your blood sugar will be checked by the staff and you will probably be given insulin after surgery (instead of oral diabetes medicines) to make sure you have good blood sugar levels. o The goal for blood sugar control after surgery is 80-180 mg/dL.

## 2016-04-14 ENCOUNTER — Encounter (HOSPITAL_COMMUNITY)
Admission: RE | Admit: 2016-04-14 | Discharge: 2016-04-14 | Disposition: A | Discharge status: Home / Self Care | Payer: Medicare Other | Source: Ambulatory Visit | Attending: Surgery | Admitting: Surgery

## 2016-04-14 ENCOUNTER — Encounter (HOSPITAL_COMMUNITY): Payer: Self-pay

## 2016-04-14 DIAGNOSIS — Z01810 Encounter for preprocedural cardiovascular examination: Secondary | ICD-10-CM | POA: Insufficient documentation

## 2016-04-14 DIAGNOSIS — Z01812 Encounter for preprocedural laboratory examination: Secondary | ICD-10-CM | POA: Diagnosis present

## 2016-04-14 HISTORY — DX: Presence of dental prosthetic device (complete) (partial): Z97.2

## 2016-04-14 HISTORY — DX: Complete loss of teeth, unspecified cause, unspecified class: K08.109

## 2016-04-14 HISTORY — DX: Other symptoms and signs involving the musculoskeletal system: R29.898

## 2016-04-14 LAB — CBC
HCT: 39.3 % (ref 36.0–46.0)
Hemoglobin: 13.2 g/dL (ref 12.0–15.0)
MCH: 30.6 pg (ref 26.0–34.0)
MCHC: 33.6 g/dL (ref 30.0–36.0)
MCV: 91.2 fL (ref 78.0–100.0)
PLATELETS: 290 10*3/uL (ref 150–400)
RBC: 4.31 MIL/uL (ref 3.87–5.11)
RDW: 13.6 % (ref 11.5–15.5)
WBC: 10.3 10*3/uL (ref 4.0–10.5)

## 2016-04-14 LAB — BASIC METABOLIC PANEL
Anion gap: 8 (ref 5–15)
BUN: 10 mg/dL (ref 6–20)
CHLORIDE: 99 mmol/L — AB (ref 101–111)
CO2: 23 mmol/L (ref 22–32)
CREATININE: 0.83 mg/dL (ref 0.44–1.00)
Calcium: 9 mg/dL (ref 8.9–10.3)
GFR calc non Af Amer: >60 mL/min (ref >60)
GLUCOSE: 113 mg/dL — AB (ref 65–99)
Potassium: 4.1 mmol/L (ref 3.5–5.1)
Sodium: 130 mmol/L — ABNORMAL LOW (ref 135–145)

## 2016-04-14 LAB — GLUCOSE, CAPILLARY: GLUCOSE-CAPILLARY: 128 mg/dL — AB (ref 65–99)

## 2016-04-14 NOTE — Progress Notes (Signed)
PCP - Dr. Gwenlyn Perking Cardiologist - denies  EKG - 04/14/16 CXR - denies  Echo/stress test/cardiac cath - denies  Patient states that she checks her blood sugar once a week at best and does not know what her fasting glucose is.  Patient denies chest pain and shortness of breath at PAT appointment.

## 2016-04-14 NOTE — Progress Notes (Signed)
Patient's blood pressure is 182/90 at PAT appointment and patient states that is where she normally runs.  Patient informs nurse that her PCP is aware and that he has been working with her to get her BP down.  Nurse encouraged patient to contact PCP today to let him know of her pressure and that she has an upcoming surgery.  Patient verbalized understanding and stated that she would call.

## 2016-04-15 LAB — HEMOGLOBIN A1C
HEMOGLOBIN A1C: 6.1 % — AB (ref 4.8–5.6)
MEAN PLASMA GLUCOSE: 128 mg/dL

## 2016-04-15 NOTE — Progress Notes (Addendum)
Anesthesia Chart Review:  Pt is a 71 year old female scheduled for L breast lumpectomy with radioactive seed and sentinel lymph node biopsy, B immediate mammary reduction on 04/22/2016 with Alphonsa Overall, MD and Audelia Hives, DO.   PCP is Gwenlyn Perking, MD (see notes in care everywhere).   PMH includes:  HTN, DM, hyperlipidemia, breast cancer. Never smoker. BMI 39.5. S/p ORIF L ankle fx 08/20/15.   Pt's BP at PAT was 186/84 and 182/90 on recheck. I spoke with pt by phone and she reports she had taken her bp meds as prescribed prior to PAT and that her SBP is usually 170-178.  I instructed pt to contact her PCP and try to get in to get her BP under better control prior to surgery. I notified Abigail Butts in Dr. Pollie Friar office.   Medications include: ASA, clonidine, lisinopril-hctz, metformin, pravastatin.   Preoperative labs reviewed.  hgbA1c 6.1, glucose 113  EKG 04/14/16: Sinus rhythm with PACs. LVH. Nonspecific ST abnormality - minimal  If BP acceptable DOS, I anticipate pt can proceed as scheduled.   Willeen Cass, FNP-BC [facility] Short Stay Surgical Center/Anesthesiology Phone: [phone] 04/15/2016 12:37 PM  Addendum: I called patient to follow-up regarding HTN follow-up.  She reported that she did call her her PCP, and he called in prazosin Q HS. (She is not sure about the dose because [facility] will not have it ready for pick up until 04/17/16.) She does not have a way to monitor her BP once she starts the prazosin. She reported that currently BP runs ~ 170/70-80's. I am anticipating that with the addition of another agent, her BP will be reasonable to proceed to OR. She will need on-going out-patient PCP follow-up. She is for seed implant on 04/21/16 and surgery on 04/22/16. I sent a staff message to Dr. Lucia Gaskins and Dr. [name] giving an update. She will get vitals on arrival--and hopefully the day of her seed implant.   [facility] Short Stay Center/Anesthesiology Phone  [phone] 04/16/2016 5:25 PM

## 2016-04-21 MED ORDER — CEFAZOLIN SODIUM-DEXTROSE 2-4 GM/100ML-% IV SOLN
2.0000 g | INTRAVENOUS | Status: AC
  Administered 2016-04-22 (×2): 2 g via INTRAVENOUS
  Filled 2016-04-21: qty 100

## 2016-04-22 ENCOUNTER — Encounter (HOSPITAL_COMMUNITY)
Admission: RE | Admit: 2016-04-22 | Discharge: 2016-04-22 | Disposition: A | Discharge status: Home / Self Care | Payer: Medicare Other | Source: Ambulatory Visit | Attending: Surgery | Admitting: Surgery

## 2016-04-22 ENCOUNTER — Encounter (HOSPITAL_COMMUNITY)
Admission: RE | Disposition: A | Discharge status: Home / Self Care | Payer: Self-pay | Source: Ambulatory Visit | Attending: Plastic Surgery

## 2016-04-22 ENCOUNTER — Observation Stay (HOSPITAL_COMMUNITY)
Admission: RE | Admit: 2016-04-22 | Discharge: 2016-04-23 | Disposition: A | Discharge status: Home / Self Care | Payer: Medicare Other | Source: Ambulatory Visit | Attending: Plastic Surgery | Admitting: Plastic Surgery

## 2016-04-22 ENCOUNTER — Encounter (HOSPITAL_COMMUNITY): Payer: Self-pay | Admitting: Plastic Surgery

## 2016-04-22 ENCOUNTER — Ambulatory Visit (HOSPITAL_COMMUNITY): Payer: Medicare Other | Admitting: Emergency Medicine

## 2016-04-22 ENCOUNTER — Ambulatory Visit (HOSPITAL_COMMUNITY): Payer: Medicare Other | Admitting: Anesthesiology

## 2016-04-22 DIAGNOSIS — Z7984 Long term (current) use of oral hypoglycemic drugs: Secondary | ICD-10-CM | POA: Insufficient documentation

## 2016-04-22 DIAGNOSIS — Z79899 Other long term (current) drug therapy: Secondary | ICD-10-CM | POA: Diagnosis not present

## 2016-04-22 DIAGNOSIS — I10 Essential (primary) hypertension: Secondary | ICD-10-CM | POA: Diagnosis not present

## 2016-04-22 DIAGNOSIS — Z7982 Long term (current) use of aspirin: Secondary | ICD-10-CM | POA: Insufficient documentation

## 2016-04-22 DIAGNOSIS — Z803 Family history of malignant neoplasm of breast: Secondary | ICD-10-CM | POA: Diagnosis not present

## 2016-04-22 DIAGNOSIS — N6489 Other specified disorders of breast: Secondary | ICD-10-CM | POA: Diagnosis not present

## 2016-04-22 DIAGNOSIS — C50912 Malignant neoplasm of unspecified site of left female breast: Secondary | ICD-10-CM

## 2016-04-22 DIAGNOSIS — N62 Hypertrophy of breast: Secondary | ICD-10-CM | POA: Insufficient documentation

## 2016-04-22 DIAGNOSIS — C50412 Malignant neoplasm of upper-outer quadrant of left female breast: Principal | ICD-10-CM | POA: Insufficient documentation

## 2016-04-22 DIAGNOSIS — Z6839 Body mass index (BMI) 39.0-39.9, adult: Secondary | ICD-10-CM | POA: Diagnosis not present

## 2016-04-22 DIAGNOSIS — E7800 Pure hypercholesterolemia, unspecified: Secondary | ICD-10-CM | POA: Diagnosis not present

## 2016-04-22 DIAGNOSIS — Z170 Estrogen receptor positive status [ER+]: Secondary | ICD-10-CM | POA: Insufficient documentation

## 2016-04-22 DIAGNOSIS — E1142 Type 2 diabetes mellitus with diabetic polyneuropathy: Secondary | ICD-10-CM | POA: Diagnosis not present

## 2016-04-22 DIAGNOSIS — C50919 Malignant neoplasm of unspecified site of unspecified female breast: Secondary | ICD-10-CM | POA: Diagnosis present

## 2016-04-22 HISTORY — PX: BREAST REDUCTION SURGERY: SHX8

## 2016-04-22 HISTORY — DX: Malignant neoplasm of unspecified site of left female breast: C50.912

## 2016-04-22 HISTORY — PX: BREAST LUMPECTOMY WITH RADIOACTIVE SEED AND SENTINEL LYMPH NODE BIOPSY: SHX6550

## 2016-04-22 HISTORY — DX: Type 2 diabetes mellitus with diabetic polyneuropathy: E11.42

## 2016-04-22 HISTORY — PX: MASTECTOMY COMPLETE / SIMPLE W/ SENTINEL NODE BIOPSY: SUR846

## 2016-04-22 HISTORY — DX: Type 2 diabetes mellitus without complications: E11.9

## 2016-04-22 HISTORY — PX: REDUCTION MAMMAPLASTY: SUR839

## 2016-04-22 LAB — CBC
HEMATOCRIT: 32.9 % — AB (ref 36.0–46.0)
Hemoglobin: 10.8 g/dL — ABNORMAL LOW (ref 12.0–15.0)
MCH: 30.2 pg (ref 26.0–34.0)
MCHC: 32.8 g/dL (ref 30.0–36.0)
MCV: 91.9 fL (ref 78.0–100.0)
Platelets: 291 10*3/uL (ref 150–400)
RBC: 3.58 MIL/uL — ABNORMAL LOW (ref 3.87–5.11)
RDW: 13.6 % (ref 11.5–15.5)
WBC: 24.7 10*3/uL — AB (ref 4.0–10.5)

## 2016-04-22 LAB — BASIC METABOLIC PANEL
ANION GAP: 6 (ref 5–15)
BUN: 10 mg/dL (ref 6–20)
CALCIUM: 8.5 mg/dL — AB (ref 8.9–10.3)
CO2: 24 mmol/L (ref 22–32)
Chloride: 101 mmol/L (ref 101–111)
Creatinine, Ser: 0.82 mg/dL (ref 0.44–1.00)
GFR calc Af Amer: >60 mL/min (ref >60)
GLUCOSE: 145 mg/dL — AB (ref 65–99)
POTASSIUM: 3.7 mmol/L (ref 3.5–5.1)
SODIUM: 131 mmol/L — AB (ref 135–145)

## 2016-04-22 LAB — GLUCOSE, CAPILLARY
GLUCOSE-CAPILLARY: 131 mg/dL — AB (ref 65–99)
GLUCOSE-CAPILLARY: 139 mg/dL — AB (ref 65–99)
Glucose-Capillary: 161 mg/dL — ABNORMAL HIGH (ref 65–99)
Glucose-Capillary: 194 mg/dL — ABNORMAL HIGH (ref 65–99)

## 2016-04-22 SURGERY — BREAST LUMPECTOMY WITH RADIOACTIVE SEED AND SENTINEL LYMPH NODE BIOPSY
Anesthesia: Regional | Site: Breast | Laterality: Left

## 2016-04-22 MED ORDER — ONDANSETRON HCL 4 MG/2ML IJ SOLN
INTRAMUSCULAR | Status: AC
  Filled 2016-04-22: qty 2

## 2016-04-22 MED ORDER — ACETAMINOPHEN 500 MG PO TABS
1000.0000 mg | ORAL_TABLET | Freq: Four times a day (QID) | ORAL | Status: DC
  Administered 2016-04-22 – 2016-04-23 (×3): 1000 mg via ORAL
  Filled 2016-04-22 (×3): qty 2

## 2016-04-22 MED ORDER — 0.9 % SODIUM CHLORIDE (POUR BTL) OPTIME
TOPICAL | Status: DC | PRN
  Administered 2016-04-22 (×2): 1000 mL

## 2016-04-22 MED ORDER — ONDANSETRON HCL 4 MG/2ML IJ SOLN
INTRAMUSCULAR | Status: DC | PRN
  Administered 2016-04-22: 4 mg via INTRAVENOUS

## 2016-04-22 MED ORDER — CEFAZOLIN SODIUM 1 G IJ SOLR
INTRAMUSCULAR | Status: AC
  Filled 2016-04-22: qty 20

## 2016-04-22 MED ORDER — FENTANYL CITRATE (PF) 100 MCG/2ML IJ SOLN
INTRAMUSCULAR | Status: AC
  Administered 2016-04-22: 50 ug via INTRAVENOUS
  Filled 2016-04-22: qty 2

## 2016-04-22 MED ORDER — MIDAZOLAM HCL 2 MG/2ML IJ SOLN
2.0000 mg | Freq: Once | INTRAMUSCULAR | Status: AC
  Administered 2016-04-22: 2 mg via INTRAVENOUS

## 2016-04-22 MED ORDER — CEFAZOLIN SODIUM-DEXTROSE 2-4 GM/100ML-% IV SOLN
2.0000 g | Freq: Three times a day (TID) | INTRAVENOUS | Status: DC
  Administered 2016-04-22 – 2016-04-23 (×3): 2 g via INTRAVENOUS
  Filled 2016-04-22 (×5): qty 100

## 2016-04-22 MED ORDER — HYDROCODONE-ACETAMINOPHEN 5-325 MG PO TABS
1.0000 | ORAL_TABLET | ORAL | Status: DC | PRN
  Administered 2016-04-23: 1 via ORAL
  Administered 2016-04-23: 2 via ORAL
  Filled 2016-04-22 (×2): qty 2

## 2016-04-22 MED ORDER — LISINOPRIL-HYDROCHLOROTHIAZIDE 20-25 MG PO TABS: 1.0000 | ORAL_TABLET | Freq: Every day | ORAL | Status: DC

## 2016-04-22 MED ORDER — METFORMIN HCL ER 500 MG PO TB24
500.0000 mg | ORAL_TABLET | Freq: Every day | ORAL | Status: DC
  Administered 2016-04-22: 500 mg via ORAL
  Filled 2016-04-22: qty 1

## 2016-04-22 MED ORDER — PROMETHAZINE HCL 25 MG/ML IJ SOLN
6.2500 mg | INTRAMUSCULAR | Status: DC | PRN
Start: 2016-04-22 — End: 2016-04-22

## 2016-04-22 MED ORDER — ACETAMINOPHEN 10 MG/ML IV SOLN
INTRAVENOUS | Status: DC | PRN
  Administered 2016-04-22: 1000 mg via INTRAVENOUS

## 2016-04-22 MED ORDER — POLYETHYLENE GLYCOL 3350 17 G PO PACK: 17.0000 g | PACK | Freq: Every day | ORAL | Status: DC | PRN

## 2016-04-22 MED ORDER — FENTANYL CITRATE (PF) 100 MCG/2ML IJ SOLN
100.0000 ug | Freq: Once | INTRAMUSCULAR | Status: AC
  Administered 2016-04-22: 50 ug via INTRAVENOUS

## 2016-04-22 MED ORDER — SUCCINYLCHOLINE CHLORIDE 200 MG/10ML IV SOSY
PREFILLED_SYRINGE | INTRAVENOUS | Status: AC
  Filled 2016-04-22: qty 10

## 2016-04-22 MED ORDER — CLONIDINE HCL 0.2 MG PO TABS
0.2000 mg | ORAL_TABLET | Freq: Two times a day (BID) | ORAL | Status: DC
  Administered 2016-04-22 – 2016-04-23 (×2): 0.2 mg via ORAL
  Filled 2016-04-22 (×2): qty 1

## 2016-04-22 MED ORDER — OXYCODONE HCL 5 MG PO TABS: 5.0000 mg | ORAL_TABLET | Freq: Once | ORAL | Status: DC | PRN

## 2016-04-22 MED ORDER — FENTANYL CITRATE (PF) 100 MCG/2ML IJ SOLN
INTRAMUSCULAR | Status: DC | PRN
  Administered 2016-04-22 (×4): 50 ug via INTRAVENOUS
  Administered 2016-04-22: 25 ug via INTRAVENOUS
  Administered 2016-04-22: 50 ug via INTRAVENOUS

## 2016-04-22 MED ORDER — CHLORHEXIDINE GLUCONATE 4 % EX LIQD: 60.0000 mL | Freq: Once | CUTANEOUS | Status: DC

## 2016-04-22 MED ORDER — ACETAMINOPHEN 10 MG/ML IV SOLN
INTRAVENOUS | Status: AC
  Filled 2016-04-22: qty 100

## 2016-04-22 MED ORDER — SODIUM CHLORIDE 0.9 % IJ SOLN
INTRAMUSCULAR | Status: AC
  Filled 2016-04-22: qty 20

## 2016-04-22 MED ORDER — HYDROMORPHONE HCL 1 MG/ML IJ SOLN
INTRAMUSCULAR | Status: AC
  Filled 2016-04-22: qty 1

## 2016-04-22 MED ORDER — PROPOFOL 10 MG/ML IV BOLUS
INTRAVENOUS | Status: AC
  Filled 2016-04-22: qty 20

## 2016-04-22 MED ORDER — BISACODYL 5 MG PO TBEC: 5.0000 mg | DELAYED_RELEASE_TABLET | Freq: Every day | ORAL | Status: DC | PRN

## 2016-04-22 MED ORDER — HYDROMORPHONE HCL 1 MG/ML IJ SOLN
1.0000 mg | INTRAMUSCULAR | Status: DC | PRN
  Administered 2016-04-23 (×2): 1 mg via INTRAVENOUS
  Filled 2016-04-22 (×2): qty 1

## 2016-04-22 MED ORDER — HYDROMORPHONE HCL 1 MG/ML IJ SOLN
0.2500 mg | INTRAMUSCULAR | Status: DC | PRN
  Administered 2016-04-22: 0.5 mg via INTRAVENOUS

## 2016-04-22 MED ORDER — SENNA 8.6 MG PO TABS
1.0000 | ORAL_TABLET | Freq: Two times a day (BID) | ORAL | Status: DC
  Administered 2016-04-22 – 2016-04-23 (×2): 8.6 mg via ORAL
  Filled 2016-04-22 (×2): qty 1

## 2016-04-22 MED ORDER — HYDROMORPHONE HCL 1 MG/ML IJ SOLN
INTRAMUSCULAR | Status: AC
  Administered 2016-04-22: 1 mg
  Filled 2016-04-22: qty 1

## 2016-04-22 MED ORDER — ROCURONIUM BROMIDE 50 MG/5ML IV SOLN
INTRAVENOUS | Status: AC
  Filled 2016-04-22: qty 2

## 2016-04-22 MED ORDER — SUCCINYLCHOLINE CHLORIDE 200 MG/10ML IV SOSY
PREFILLED_SYRINGE | INTRAVENOUS | Status: DC | PRN
  Administered 2016-04-22: 100 mg via INTRAVENOUS

## 2016-04-22 MED ORDER — LACTATED RINGERS IV SOLN
INTRAVENOUS | Status: DC
  Administered 2016-04-22 (×3): via INTRAVENOUS

## 2016-04-22 MED ORDER — ONDANSETRON HCL 4 MG/2ML IJ SOLN
4.0000 mg | Freq: Four times a day (QID) | INTRAMUSCULAR | Status: DC | PRN
  Administered 2016-04-22 – 2016-04-23 (×2): 4 mg via INTRAVENOUS
  Filled 2016-04-22 (×2): qty 2

## 2016-04-22 MED ORDER — PHENYLEPHRINE 40 MCG/ML (10ML) SYRINGE FOR IV PUSH (FOR BLOOD PRESSURE SUPPORT)
PREFILLED_SYRINGE | INTRAVENOUS | Status: AC
  Filled 2016-04-22: qty 10

## 2016-04-22 MED ORDER — PROPOFOL 10 MG/ML IV BOLUS
INTRAVENOUS | Status: DC | PRN
  Administered 2016-04-22: 150 mg via INTRAVENOUS

## 2016-04-22 MED ORDER — BUPIVACAINE-EPINEPHRINE (PF) 0.25% -1:200000 IJ SOLN
INTRAMUSCULAR | Status: AC
  Filled 2016-04-22: qty 30

## 2016-04-22 MED ORDER — FENTANYL CITRATE (PF) 250 MCG/5ML IJ SOLN
INTRAMUSCULAR | Status: AC
  Filled 2016-04-22: qty 5

## 2016-04-22 MED ORDER — DIPHENHYDRAMINE HCL 12.5 MG/5ML PO ELIX: 12.5000 mg | ORAL_SOLUTION | Freq: Four times a day (QID) | ORAL | Status: DC | PRN

## 2016-04-22 MED ORDER — LISINOPRIL 20 MG PO TABS
20.0000 mg | ORAL_TABLET | Freq: Every day | ORAL | Status: DC
  Administered 2016-04-23: 20 mg via ORAL
  Filled 2016-04-22: qty 1

## 2016-04-22 MED ORDER — SODIUM CHLORIDE 0.9 % IR SOLN
Status: DC | PRN
  Administered 2016-04-22: 500 mL

## 2016-04-22 MED ORDER — EPHEDRINE SULFATE 50 MG/ML IJ SOLN
INTRAMUSCULAR | Status: DC | PRN
  Administered 2016-04-22: 10 mg via INTRAVENOUS
  Administered 2016-04-22: 20 mg via INTRAVENOUS

## 2016-04-22 MED ORDER — KCL IN DEXTROSE-NACL 20-5-0.45 MEQ/L-%-% IV SOLN
INTRAVENOUS | Status: DC
  Administered 2016-04-22: 19:00:00 via INTRAVENOUS
  Filled 2016-04-22: qty 1000

## 2016-04-22 MED ORDER — TECHNETIUM TC 99M SULFUR COLLOID FILTERED
1.0000 | Freq: Once | INTRAVENOUS | Status: AC | PRN
  Administered 2016-04-22: 1 via INTRADERMAL

## 2016-04-22 MED ORDER — EPHEDRINE 5 MG/ML INJ
INTRAVENOUS | Status: AC
  Filled 2016-04-22: qty 10

## 2016-04-22 MED ORDER — NAPROXEN 250 MG PO TABS: 500.0000 mg | ORAL_TABLET | Freq: Two times a day (BID) | ORAL | Status: DC | PRN

## 2016-04-22 MED ORDER — DIAZEPAM 2 MG PO TABS: 2.0000 mg | ORAL_TABLET | Freq: Two times a day (BID) | ORAL | Status: DC | PRN

## 2016-04-22 MED ORDER — BUPIVACAINE-EPINEPHRINE 0.25% -1:200000 IJ SOLN
INTRAMUSCULAR | Status: DC | PRN
Start: 2016-04-22 — End: 2016-04-22
  Administered 2016-04-22: .1 mL

## 2016-04-22 MED ORDER — HYDROCHLOROTHIAZIDE 25 MG PO TABS
25.0000 mg | ORAL_TABLET | Freq: Every day | ORAL | Status: DC
  Administered 2016-04-23: 25 mg via ORAL
  Filled 2016-04-22: qty 1

## 2016-04-22 MED ORDER — MIDAZOLAM HCL 2 MG/2ML IJ SOLN
INTRAMUSCULAR | Status: AC
  Filled 2016-04-22: qty 2

## 2016-04-22 MED ORDER — MIDAZOLAM HCL 2 MG/2ML IJ SOLN
INTRAMUSCULAR | Status: AC
  Administered 2016-04-22: 2 mg via INTRAVENOUS
  Filled 2016-04-22: qty 2

## 2016-04-22 MED ORDER — DIPHENHYDRAMINE HCL 50 MG/ML IJ SOLN: 12.5000 mg | Freq: Four times a day (QID) | INTRAMUSCULAR | Status: DC | PRN

## 2016-04-22 MED ORDER — CHLORHEXIDINE GLUCONATE 4 % EX LIQD
60.0000 mL | Freq: Once | CUTANEOUS | Status: DC
Start: 2016-04-22 — End: 2016-04-22

## 2016-04-22 MED ORDER — PHENYLEPHRINE HCL 10 MG/ML IJ SOLN
INTRAMUSCULAR | Status: DC | PRN
  Administered 2016-04-22: 120 ug via INTRAVENOUS

## 2016-04-22 MED ORDER — ONDANSETRON 4 MG PO TBDP: 4.0000 mg | ORAL_TABLET | Freq: Four times a day (QID) | ORAL | Status: DC | PRN

## 2016-04-22 MED ORDER — ROCURONIUM BROMIDE 100 MG/10ML IV SOLN
INTRAVENOUS | Status: DC | PRN
  Administered 2016-04-22: 40 mg via INTRAVENOUS
  Administered 2016-04-22: 10 mg via INTRAVENOUS

## 2016-04-22 MED ORDER — PHENYLEPHRINE HCL 10 MG/ML IJ SOLN
INTRAVENOUS | Status: DC | PRN
  Administered 2016-04-22: 10 ug/min via INTRAVENOUS

## 2016-04-22 MED ORDER — OXYCODONE HCL 5 MG/5ML PO SOLN: 5.0000 mg | Freq: Once | ORAL | Status: DC | PRN

## 2016-04-22 SURGICAL SUPPLY — 101 items
ADH SKN CLS APL DERMABOND .7 (GAUZE/BANDAGES/DRESSINGS) ×4
ADH SKN CLS LQ APL DERMABOND (GAUZE/BANDAGES/DRESSINGS) ×8
APPLIER CLIP 9.375 MED OPEN (MISCELLANEOUS) ×4
APR CLP MED 9.3 20 MLT OPN (MISCELLANEOUS) ×2
BAG DECANTER FOR FLEXI CONT (MISCELLANEOUS) ×4 IMPLANT
BALL CTTN LRG ABS STRL LF (GAUZE/BANDAGES/DRESSINGS) ×8
BINDER BREAST LRG (GAUZE/BANDAGES/DRESSINGS) IMPLANT
BINDER BREAST MEDIUM (GAUZE/BANDAGES/DRESSINGS) IMPLANT
BINDER BREAST XLRG (GAUZE/BANDAGES/DRESSINGS) ×2 IMPLANT
BINDER BREAST XXLRG (GAUZE/BANDAGES/DRESSINGS) IMPLANT
BIOPATCH RED 1 DISK 7.0 (GAUZE/BANDAGES/DRESSINGS) ×6 IMPLANT
BIOPATCH RED 1IN DISK 7.0MM (GAUZE/BANDAGES/DRESSINGS) ×2
BLADE 10 SAFETY STRL DISP (BLADE) ×4 IMPLANT
BLADE HEX COATED 2.75 (ELECTRODE) ×4 IMPLANT
BLADE SURG 10 STRL SS (BLADE) ×4 IMPLANT
BLADE SURG 15 STRL LF DISP TIS (BLADE) ×6 IMPLANT
BLADE SURG 15 STRL SS (BLADE) ×8
BNDG GAUZE ELAST 4 BULKY (GAUZE/BANDAGES/DRESSINGS) ×11 IMPLANT
CANISTER SUCTION 1200CC (MISCELLANEOUS) ×4 IMPLANT
CANISTER SUCTION 2500CC (MISCELLANEOUS) ×4 IMPLANT
CHLORAPREP W/TINT 26ML (MISCELLANEOUS) ×8 IMPLANT
CLIP APPLIE 9.375 MED OPEN (MISCELLANEOUS) ×2 IMPLANT
CLIP TI WIDE RED SMALL 6 (CLIP) ×2 IMPLANT
CONT SPEC 4OZ CLIKSEAL STRL BL (MISCELLANEOUS) ×4 IMPLANT
COTTONBALL LRG STERILE PKG (GAUZE/BANDAGES/DRESSINGS) ×16 IMPLANT
COVER PROBE W GEL 5X96 (DRAPES) ×4 IMPLANT
COVER SURGICAL LIGHT HANDLE (MISCELLANEOUS) ×6 IMPLANT
DECANTER SPIKE VIAL GLASS SM (MISCELLANEOUS) IMPLANT
DERMABOND ADHESIVE PROPEN (GAUZE/BANDAGES/DRESSINGS) ×8
DERMABOND ADVANCED (GAUZE/BANDAGES/DRESSINGS) ×4
DERMABOND ADVANCED .7 DNX12 (GAUZE/BANDAGES/DRESSINGS) ×4 IMPLANT
DERMABOND ADVANCED .7 DNX6 (GAUZE/BANDAGES/DRESSINGS) IMPLANT
DRAIN CHANNEL 19F RND (DRAIN) IMPLANT
DRAPE CHEST BREAST 15X10 FENES (DRAPES) ×4 IMPLANT
DRAPE LAPAROSCOPIC ABDOMINAL (DRAPES) ×4 IMPLANT
DRAPE PROXIMA HALF (DRAPES) ×6 IMPLANT
DRAPE U-SHAPE 76X120 STRL (DRAPES) ×6 IMPLANT
DRAPE UTILITY XL STRL (DRAPES) ×8 IMPLANT
DRSG PAD ABDOMINAL 8X10 ST (GAUZE/BANDAGES/DRESSINGS) ×8 IMPLANT
ELECT BLADE 4.0 EZ CLEAN MEGAD (MISCELLANEOUS) ×4
ELECT CAUTERY BLADE 6.4 (BLADE) ×4 IMPLANT
ELECT REM PT RETURN 9FT ADLT (ELECTROSURGICAL) ×8
ELECTRODE BLDE 4.0 EZ CLN MEGD (MISCELLANEOUS) ×2 IMPLANT
ELECTRODE REM PT RTRN 9FT ADLT (ELECTROSURGICAL) ×4 IMPLANT
EVACUATOR SILICONE 100CC (DRAIN) ×4 IMPLANT
GAUZE SPONGE 4X4 12PLY STRL (GAUZE/BANDAGES/DRESSINGS) ×4 IMPLANT
GAUZE XEROFORM 5X9 LF (GAUZE/BANDAGES/DRESSINGS) ×4 IMPLANT
GLOVE BIO SURGEON STRL SZ 6.5 (GLOVE) ×9 IMPLANT
GLOVE BIO SURGEON STRL SZ7 (GLOVE) ×4 IMPLANT
GLOVE BIO SURGEONS STRL SZ 6.5 (GLOVE) ×5
GLOVE BIOGEL PI IND STRL 6.5 (GLOVE) ×4 IMPLANT
GLOVE BIOGEL PI IND STRL 7.0 (GLOVE) IMPLANT
GLOVE BIOGEL PI INDICATOR 6.5 (GLOVE) ×4
GLOVE BIOGEL PI INDICATOR 7.0 (GLOVE) ×2
GLOVE SURG SIGNA 7.5 PF LTX (GLOVE) ×8 IMPLANT
GLOVE SURG SS PI 6.5 STRL IVOR (GLOVE) ×2 IMPLANT
GOWN STRL REUS W/ TWL LRG LVL3 (GOWN DISPOSABLE) ×8 IMPLANT
GOWN STRL REUS W/ TWL XL LVL3 (GOWN DISPOSABLE) ×2 IMPLANT
GOWN STRL REUS W/TWL LRG LVL3 (GOWN DISPOSABLE) ×20
GOWN STRL REUS W/TWL XL LVL3 (GOWN DISPOSABLE) ×4
KIT BASIN OR (CUSTOM PROCEDURE TRAY) ×4 IMPLANT
KIT MARKER MARGIN INK (KITS) ×4 IMPLANT
LIQUID BAND (GAUZE/BANDAGES/DRESSINGS) ×4 IMPLANT
MARKER SKIN DUAL TIP RULER LAB (MISCELLANEOUS) ×8 IMPLANT
NDL FILTER BLUNT 18X1 1/2 (NEEDLE) IMPLANT
NDL HYPO 25X1 1.5 SAFETY (NEEDLE) ×2 IMPLANT
NDL SAFETY ECLIPSE 18X1.5 (NEEDLE) IMPLANT
NEEDLE FILTER BLUNT 18X 1/2SAF (NEEDLE)
NEEDLE FILTER BLUNT 18X1 1/2 (NEEDLE) IMPLANT
NEEDLE HYPO 18GX1.5 SHARP (NEEDLE)
NEEDLE HYPO 25X1 1.5 SAFETY (NEEDLE) ×4 IMPLANT
NEEDLE SPNL 22GX3.5 QUINCKE BK (NEEDLE) ×4 IMPLANT
NS IRRIG 1000ML POUR BTL (IV SOLUTION) ×8 IMPLANT
PACK GENERAL/GYN (CUSTOM PROCEDURE TRAY) ×6 IMPLANT
PACK SURGICAL SETUP 50X90 (CUSTOM PROCEDURE TRAY) ×4 IMPLANT
PENCIL BUTTON HOLSTER BLD 10FT (ELECTRODE) ×4 IMPLANT
SPONGE LAP 18X18 X RAY DECT (DISPOSABLE) ×24 IMPLANT
SUT MNCRL AB 3-0 PS2 18 (SUTURE) ×10 IMPLANT
SUT MNCRL AB 4-0 PS2 18 (SUTURE) ×24 IMPLANT
SUT MON AB 5-0 PS2 18 (SUTURE) ×22 IMPLANT
SUT PDS AB 2-0 CT2 27 (SUTURE) IMPLANT
SUT SILK 2 0 SH (SUTURE) ×2 IMPLANT
SUT SILK 3 0 PS 1 (SUTURE) IMPLANT
SUT SILK 3 0 SH 30 (SUTURE) ×4 IMPLANT
SUT SILK 4 0 SH CR/8 (SUTURE) ×2 IMPLANT
SUT VIC AB 3-0 SH 27 (SUTURE) ×8
SUT VIC AB 3-0 SH 27X BRD (SUTURE) ×4 IMPLANT
SUT VIC AB 3-0 SH 8-18 (SUTURE) ×4 IMPLANT
SUT VIC AB 4-0 SH 18 (SUTURE) ×6 IMPLANT
SUT VICRYL 4-0 PS2 18IN ABS (SUTURE) ×8 IMPLANT
SYR 50ML LL SCALE MARK (SYRINGE) IMPLANT
SYR BULB 3OZ (MISCELLANEOUS) ×4 IMPLANT
SYR CONTROL 10ML LL (SYRINGE) ×4 IMPLANT
TOWEL OR 17X24 6PK STRL BLUE (TOWEL DISPOSABLE) ×14 IMPLANT
TOWEL OR 17X26 10 PK STRL BLUE (TOWEL DISPOSABLE) ×4 IMPLANT
TUBE CONNECTING 12'X1/4 (SUCTIONS) ×1
TUBE CONNECTING 12X1/4 (SUCTIONS) ×3 IMPLANT
TUBE CONNECTING 20'X1/4 (TUBING) ×1
TUBE CONNECTING 20X1/4 (TUBING) ×3 IMPLANT
UNDERPAD 30X30 INCONTINENT (UNDERPADS AND DIAPERS) ×8 IMPLANT
YANKAUER SUCT BULB TIP NO VENT (SUCTIONS) ×8 IMPLANT

## 2016-04-22 NOTE — Anesthesia Preprocedure Evaluation (Signed)
Anesthesia Evaluation  Patient identified by MRN, date of birth, ID band Patient awake    Reviewed: Allergy & Precautions, NPO status , Patient's Chart, lab work & pertinent test results  Airway Mallampati: I  TM Distance: >3 FB Neck ROM: Full    Dental  (+) Edentulous Upper, Edentulous Lower, Dental Advidsory Given   Pulmonary neg pulmonary ROS,    breath sounds clear to auscultation       Cardiovascular hypertension, Pt. on medications  Rhythm:Regular Rate:Normal     Neuro/Psych negative neurological ROS     GI/Hepatic negative GI ROS, Neg liver ROS,   Endo/Other  diabetes, Type 2Morbid obesity  Renal/GU negative Renal ROS     Musculoskeletal   Abdominal   Peds  Hematology negative hematology ROS (+)   Anesthesia Other Findings   Reproductive/Obstetrics                             Anesthesia Physical  Anesthesia Plan  ASA: III  Anesthesia Plan: Regional and General   Post-op Pain Management: GA combined w/ Regional for post-op pain   Induction: Intravenous  Airway Management Planned: Oral ETT  Additional Equipment:   Intra-op Plan:   Post-operative Plan: Extubation in OR  Informed Consent: I have reviewed the patients History and Physical, chart, labs and discussed the procedure including the risks, benefits and alternatives for the proposed anesthesia with the patient or authorized representative who has indicated his/her understanding and acceptance.   Dental advisory given and Dental Advisory Given  Plan Discussed with: CRNA, Anesthesiologist and Surgeon  Anesthesia Plan Comments:         Anesthesia Quick Evaluation

## 2016-04-22 NOTE — H&P (Signed)
Tanya Juarez Location: [facility] Patient #: [id number] DOB: 1946-09-13 Undefined / Language: Cleophus Molt / Race: Black or African American Female   History of Present Illness   The patient is a 70 year old female who presents with a complaint of breast cancer.   Her PCP is Dr. Gwenlyn Perking.  She is at the Breast [facility] clinic - Drs. Magrinat and [place].  She is accompanied with her DIL Tamarind, and sister, Luretha Rued.  She had not had a mammogram in 13 years. She is not on hormone tx. She has pendulous breast. Has some back pain from the wieght of her breast and has thougth in the past about breast reduction. Ms. Self had a mammogram at [facility] on 02/13/2016 which showed a group of microca++ in the left breast. A biopsy of the left breast on 02/18/2016 [phone]) showed IDC (this is a small focus), grade 1, ER and PR positive.  I discussed the options for breast cancer treatment with the patient. She is in the breast multidisciplinary clinic, which includes medical oncology and radiation oncology. I discussed the surgical options of lumpectomy vs. mastectomy. If mastectomy, there is the possibility of reconstruction. I discussed the options of lymph node biopsy. The treatment plan depends on the pathologic staging of the tumor and the patient's personal wishes. The risks of surgery include, but are not limited to, bleeding, infection, the need for further surgery, and nerve injury. The patient has been given literature on the treatment of breast cancer.  Plan: 1) Plastic surgery consultation, 2) Consider planned breast reduction coordinated with left breast lumpectomy and left axillary SLNBx, 3) Rad tx, 4) Anti-estrogen  Past Medical History: 1. Open left ankle fx - hospitalized from 08/18/2015 to 08/23/2015 Dr. Dollene Primrose ortho 2. HTN x 20 years 3. DM x 3 years On pill 4. Has some lower extremity neuropathy 5. Obese  - weight - 241, BMI - 37.8  Social History: Widowed. Lives by self. Retired from [employer] x 40 years. Sons - Fritz Pickerel [facility]) and Lennette Bihari [facility] [employer]) She is accompanied with her DIL Tamarind, and sister, Luretha Rued.   Other Problems Conni Slipper, RN; 03/03/2016 7:49 AM) Diabetes Mellitus High blood pressure Hypercholesterolemia  Past Surgical History Conni Slipper, RN; 03/03/2016 7:49 AM) Breast Biopsy Left. Cesarean Section - Multiple Foot Surgery Left.  Diagnostic Studies History Conni Slipper, RN; 03/03/2016 7:49 AM) Colonoscopy never Mammogram within last year Pap Smear >5 years ago  Medication History Conni Slipper, RN; 03/03/2016 7:49 AM) Medications Reconciled  Social History Conni Slipper, RN; 03/03/2016 7:49 AM) Alcohol use Occasional alcohol use. Caffeine use Coffee. No drug use Tobacco use Never smoker.  Family History Conni Slipper, RN; 03/03/2016 7:49 AM) Breast Cancer Sister. Cancer Father, Mother, Sister.  Pregnancy / Birth History Conni Slipper, RN; 03/03/2016 7:49 AM) Age at menarche 17 years. Age of menopause 53-55 Contraceptive History Oral contraceptives. Gravida 2 Maternal age 60-25 Para 2    Review of Systems Conni Slipper RN; 03/03/2016 7:49 AM) General Not Present- Appetite Loss, Chills, Fatigue, Fever, Night Sweats, Weight Gain and Weight Loss. Skin Not Present- Change in Wart/Mole, Dryness, Hives, Jaundice, New Lesions, Non-Healing Wounds, Rash and Ulcer. HEENT Not Present- Earache, Hearing Loss, Hoarseness, Nose Bleed, Oral Ulcers, Ringing in the Ears, Seasonal Allergies, Sinus Pain, Sore Throat, Visual Disturbances, Wears glasses/contact lenses and Yellow Eyes. Respiratory Not Present- Bloody sputum, Chronic Cough, Difficulty Breathing, Snoring and Wheezing. Breast Not Present- Breast Mass, Breast Pain, Nipple Discharge and Skin Changes. Cardiovascular Present- Swelling  of Extremities. Not Present- Chest Pain, Difficulty  Breathing Lying Down, Leg Cramps, Palpitations, Rapid Heart Rate and Shortness of Breath. Gastrointestinal Not Present- Abdominal Pain, Bloating, Bloody Stool, Change in Bowel Habits, Chronic diarrhea, Constipation, Difficulty Swallowing, Excessive gas, Gets full quickly at meals, Hemorrhoids, Indigestion, Nausea, Rectal Pain and Vomiting. Female Genitourinary Not Present- Frequency, Nocturia, Painful Urination, Pelvic Pain and Urgency. Musculoskeletal Not Present- Back Pain, Joint Pain, Joint Stiffness, Muscle Pain, Muscle Weakness and Swelling of Extremities. Neurological Not Present- Decreased Memory, Fainting, Headaches, Numbness, Seizures, Tingling, Tremor, Trouble walking and Weakness. Psychiatric Not Present- Anxiety, Bipolar, Change in Sleep Pattern, Depression, Fearful and Frequent crying. Endocrine Not Present- Cold Intolerance, Excessive Hunger, Hair Changes, Heat Intolerance, Hot flashes and New Diabetes. Hematology Not Present- Easy Bruising, Excessive bleeding, Gland problems, HIV and Persistent Infections.  Physical Exam  General: Older obese AA F who isalert and generally healthy appearing. HEENT: Normal. Pupils equal.  Neck: Supple. No mass. No thyroid mass.  Lymph Nodes: No supraclavicular, cervical, or axillary nodes.  Lungs: Clear to auscultation and symmetric breath sounds. Heart: RRR. Loud S1. No murmur or rub.  Breasts: Right - Large, pendulous breast. 1.0 cm sebaceous cyst in UIQ of breast skin Left - Large, pendulous breast. Bruise at 3 o'clock along areola. I do not feel a mass.  Abdomen: Soft. Obese with a lot of weight in abdomen. No tenderness. No hernia. Normal bowel sounds. Lower midline scar.  Extremities: Good strength and ROM in upper and lower extremities. Edema of left ankle from fracture last fall.  Neurologic: Grossly intact to motor and sensory function. Psychiatric: Has normal mood and affect. Behavior is normal.   Assessment &  Plan  1.  BREAST CANCER, STAGE 1, LEFT (C50.912)  Story: Left breast on 02/18/2016 [id number]) showed IDC (this is a small focus), grade 1, ER and PR positive.   Oncology - Magrinat and [place]  Plan:   1) Plastic surgery consultation (she is to see Dr. [name] - 03/08/2016)   2) Consider planned breast reduction coordinated with left breast lumpectomy and left axillary SLNBx, (Dr. Isaiah Blakes talked to me.  She has put two seeds in Ms. Nicole Kindred.  They are radially placed in the 1:00 position - about 3.4 cm apart)   3) Rad tx,   4) Anti-estrogen  2.  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 HTN x 20 years  Addendum Note(Eaven Schwager H. Lucia Gaskins MD; 04/18/2016 12:04 PM)  Message left from anesthesia about her HTN.  I discussed with patient - her pressure runs high  4. Has some lower extremity neuropathy 5. Obese - weight - 241, BMI - 37.8  Alphonsa Overall, MD, [facility] Surgery Pager: [phone] Office phone:  [phone]

## 2016-04-22 NOTE — Anesthesia Postprocedure Evaluation (Signed)
Anesthesia Post Note  Patient: Tanya Juarez  Procedure(s) Performed: Procedure(s) (LRB): LEFT BREAST LUMPECTOMY WITH RADIOACTIVE SEED AND SENTINEL LYMPH NODE BIOPSY (Left) BILATERAL IMMEDIATE MAMMARY REDUCTION  (BREAST) (Bilateral)  Patient location during evaluation: PACU Anesthesia Type: General Level of consciousness: awake and alert Pain management: pain level controlled Vital Signs Assessment: post-procedure vital signs reviewed and stable Respiratory status: spontaneous breathing, nonlabored ventilation, respiratory function stable and patient connected to nasal cannula oxygen Cardiovascular status: blood pressure returned to baseline and stable Postop Assessment: no signs of nausea or vomiting Anesthetic complications: no    Last Vitals:  Vitals:   04/22/16 1530 04/22/16 1532  BP:  113/76  Pulse: 66 72  Resp: (!) 8 15  Temp:      Last Pain:  Vitals:   04/22/16 1515  TempSrc:   PainSc: Tanya Juarez

## 2016-04-22 NOTE — H&P (Signed)
Tanya Juarez is an 70 y.o. female.   Chief Complaint: breast cancer / mammary hypertrophy HPI: The patient is a 70 y.o. yrs old bf here for pre operative history and physical prior to breast reconstruction.She would like to have a partial mastectomy on the LEFT with immediate reconstruction utilizing breast reduction with symmetry surgery on the right with a reduction.   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  She is planning on a LEFT partial mastectomy with sentinel lymph node biopsy.  She would like to undergo a reduction at the same time for her reconstruction with reduction on the right for symmetry.  She uses a walker at this time.  She has diabetes and hypertension.  Preop bra is a 44G. She has very large pendulous breasts.  She had a two c-sections and left ankle surgery last year.  Radiation and anti-estrogen oral therapy is planned.  She walks with a walker.   Past Medical History:  Diagnosis Date  . Breast cancer ([place])   . Diabetes mellitus without complication ([place])    type 2  . Full dentures   . Hyperlipidemia   . Hypertension   . Neuropathy ([place])   . Weakness of both legs     Past Surgical History:  Procedure Laterality Date  . APPENDECTOMY    . CESAREAN SECTION     x2  . CESAREAN SECTION WITH BILATERAL TUBAL LIGATION    . I&D EXTREMITY Left 08/19/2015   Procedure: IRRIGATION AND DEBRIDEMENT LEFT ANKLE FRACTURE;  Surgeon: Leandrew Koyanagi, MD;  Location: [place];  Service: Orthopedics;  Laterality: Left;  . ORIF ANKLE FRACTURE Left 08/20/2015   Procedure: OPEN REDUCTION INTERNAL FIXATION (ORIF) LEFT ANKLE FRACTURE, SYNDESMOSIS INJURY;  Surgeon: Leandrew Koyanagi, MD;  Location: [place];  Service: Orthopedics;  Laterality: Left;    Family History  Problem Relation Age of Onset  . Liver cancer Mother   . Lung cancer Father   . Hypertension     Social History:  reports that she has never smoked. She has never used  smokeless tobacco. She reports that she drinks about 1.2 oz of alcohol per week . She reports that she does not use drugs.  Allergies:  Allergies  Allergen Reactions  . Amlodipine Swelling    LE edema    Medications Prior to Admission  Medication Sig Dispense Refill  . aspirin 81 MG tablet Take 81 mg by mouth daily.    . Cholecalciferol (VITAMIN D3) 1000 UNITS CAPS Take 1,000 Units by mouth daily.     . cloNIDine (CATAPRES) 0.2 MG tablet Take 0.2 mg by mouth 2 (two) times daily.    Marland Kitchen lisinopril-hydrochlorothiazide (PRINZIDE,ZESTORETIC) 20-25 MG per tablet Take 1 tablet by mouth daily.    . metFORMIN (GLUCOPHAGE-XR) 500 MG 24 hr tablet Take 1 tablet by mouth daily.    . pravastatin (PRAVACHOL) 20 MG tablet Take 1 tablet by mouth daily.    . vitamin B-12 (CYANOCOBALAMIN) 1000 MCG tablet Take 1,000 mcg by mouth daily.    Marland Kitchen aspirin EC 325 MG tablet Take 1 tablet (325 mg total) by mouth 2 (two) times daily. (Patient not taking: Reported on 04/12/2016) 84 tablet 0    No results found for this or any previous visit (from the past 48 hour(s)). No results found.  Review of Systems  Constitutional: Negative.   HENT: Negative.   Eyes: Negative.   Respiratory: Negative.   Cardiovascular: Negative.  Genitourinary: Negative.   Musculoskeletal: Negative.   Skin: Negative.     Blood pressure (!) 175/85, pulse 76, temperature 97.8 F (36.6 C), temperature source Oral, resp. rate 20, height 5' 5.5" (1.664 m), weight 108.9 kg (240 lb), SpO2 100 %. Physical Exam  Constitutional: She is oriented to person, place, and time. She appears well-developed and well-nourished.  HENT:  Head: Normocephalic and atraumatic.  Eyes: EOM are normal. Pupils are equal, round, and reactive to light.  Cardiovascular: Normal rate.   Respiratory: Effort normal.  GI: Soft. She exhibits no distension. There is no tenderness.  Neurological: She is alert and oriented to person, place, and time.  Skin: Skin is warm.  No erythema.     Assessment/Plan Plan for left breast reconstruction with reduction and right breast reduction for symmetry.  Wallace Going, DO 04/22/2016, 7:09 AM

## 2016-04-22 NOTE — Interval H&P Note (Signed)
History and Physical Interval Note:  04/22/2016 9:18 AM  Tanya Juarez  has presented today for surgery, with the diagnosis of LEFT BREAST CANCER  The various methods of treatment have been discussed with the patient and family. After consideration of risks, benefits and other options for treatment, the patient has consented to  Procedure(s): LEFT BREAST LUMPECTOMY WITH RADIOACTIVE SEED AND SENTINEL LYMPH NODE BIOPSY (Left) BILATERAL IMMEDIATE MAMMARY REDUCTION  (BREAST) (Bilateral) as a surgical intervention .  The patient's history has been reviewed, patient examined, no change in status, stable for surgery.  I have reviewed the patient's chart and labs.  Questions were answered to the patient's satisfaction.     Wallace Going

## 2016-04-22 NOTE — Op Note (Signed)
Breast Reduction Op note:    DATE OF PROCEDURE: 04/22/2016  LOCATION: [facility] OR inpatient  SURGEON: Lyndee Leo Sanger Shakeela Rabadan, DO  ASSISTANT: Shawn Rayburn, PA  PREOPERATIVE DIAGNOSIS 1. Left Breast cancer 2. Breast Asymmetry after cancer surgery 3.  Macromastia 4. Neck Pain /  Back Pain  POSTOPERATIVE DIAGNOSIS 1. Left Breast cancer 2. Breast Asymmetry after cancer surgery 3.  Macromastia 4. Neck Pain /  Back Pain  PROCEDURES 1. Bilateral breast reduction.  Right reduction 1533g, Left reduction 1354g (the partial mastectomy was not weighed.)  COMPLICATIONS: None.  DRAINS: 2  INDICATIONS FOR PROCEDURE @FNAMEA @ Tanya Juarez is a 70 y.o. year-old female born on 12-15-1945.  She was diagnosed with left breast cancer.  She is wanting to have a partial mastectomy with immediate reconstruction.  She also has a history of symptomatic macromastia with concominant back pain, neck pain, shoulder grooving from her bra.   MRN: [id number]  CONSENT Informed consent was obtained directly from the patient. The risks, benefits and alternatives were fully discussed. Specific risks including but not limited to bleeding, infection, hematoma, seroma, scarring, pain, nipple necrosis, asymmetry, poor cosmetic results, and need for further surgery were discussed. The patient had ample opportunity to have her questions answered to her satisfaction.  DESCRIPTION OF PROCEDURE  Patient was brought into the operating room and placed in a supine position.  SCDs were placed and appropriate padding was performed.  Antibiotics were given. The patient underwent general anesthesia and the chest was prepped and draped in a sterile fashion.  A timeout was performed and all information was confirmed to be correct.  Left side: I assisted General surgery with the partial mastectomy.  Once he was complete with his portion of the case the reduction was performed.  Preoperative markings were confirmed.  The marked  lines were incised.  A Wise-pattern superomedial breast reduction was performed by de-epithelializing the pedicle, using bovie to create the superomedial pedicle, and removing breast tissue from the superior, lateral, and inferior portions of the breast.  Care was taken to not undermine the breast pedicle. Hemostasis was achieved.  The nipple was removed and the subareolar tissue sent for pathology.  The nipple was defatted and skin grafted back on due to the location of the tumor.  It was secured with 4-0 Vicryl and a xeroform bolster applied. A drain was placed and secured with 4-0 Silk.  The patient was sat upright and size and shape symmetry was confirmed.  The pocket was irrigated and hemostasis confirmed.  The deep tissues were approximated with 3-0 monocryl sutures and the skin was closed with deep dermal and subcuticular 4-0 Monocryl sutures.  Right side: Preoperative markings were confirmed.  The marked lines were incised.  A Wise-pattern superomedial breast reduction was performed by de-epithelializing the pedicle, using bovie to create the superomedial pedicle, and removing breast tissue from the superior, lateral, and inferior portions of the breast.  Care was taken to not undermine the breast pedicle. Hemostasis was achieved.  The nipple was gently rotated into position and the soft tissue closed with 4-0 Monocryl.   The pocket was irrigated and hemostasis confirmed.  The deep tissues were approximated with 3-0 monocryl sutures and the skin was closed with deep dermal and subcuticular 4-0 Monocryl sutures.  The nipple and skin flaps had good capillary refill at the end of the procedure.    A drain was placed and secured with 4-0 Silk. Dermabond was applied.  A breast binder and ABDs were placed.  The nipple and skin flaps had good capillary refill at the end of the procedure.  The patient tolerated the procedure well. The patient was allowed to wake from anesthesia and taken to the recovery room in  satisfactory condition

## 2016-04-22 NOTE — Transfer of Care (Signed)
Immediate Anesthesia Transfer of Care Note  Patient: Tanya Juarez  Procedure(s) Performed: Procedure(s): LEFT BREAST LUMPECTOMY WITH RADIOACTIVE SEED AND SENTINEL LYMPH NODE BIOPSY (Left) BILATERAL IMMEDIATE MAMMARY REDUCTION  (BREAST) (Bilateral)  Patient Location: PACU  Anesthesia Type:General  Level of Consciousness:  sedated, patient cooperative and responds to stimulation  Airway & Oxygen Therapy:Patient Spontanous Breathing and Patient connected to face mask oxgen  Post-op Assessment:  Report given to PACU RN and Post -op Vital signs reviewed and stable  Post vital signs:  Reviewed and stable  Last Vitals:  Vitals:   04/22/16 0855 04/22/16 0900  BP: (!) 123/55 (!) 144/57  Pulse: 80 80  Resp: 20 18  Temp:      Complications: No apparent anesthesia complications

## 2016-04-22 NOTE — Anesthesia Procedure Notes (Addendum)
Procedure Name: Intubation Date/Time: 04/22/2016 9:28 AM Performed by: Talbot Grumbling Pre-anesthesia Checklist: Patient identified, Emergency Drugs available, Patient being monitored and Suction available Patient Re-evaluated:Patient Re-evaluated prior to inductionOxygen Delivery Method: Circle system utilized Preoxygenation: Pre-oxygenation with 100% oxygen Intubation Type: IV induction Ventilation: Mask ventilation without difficulty Laryngoscope Size: Miller and 2 Grade View: Grade I Tube type: Oral Tube size: 7.5 mm Number of attempts: 1 Airway Equipment and Method: Stylet Placement Confirmation: ETT inserted through vocal cords under direct vision,  positive ETCO2 and breath sounds checked- equal and bilateral Secured at: 21 cm Tube secured with: Tape Dental Injury: Teeth and Oropharynx as per pre-operative assessment

## 2016-04-22 NOTE — Anesthesia Procedure Notes (Signed)
Anesthesia Regional Block:  Pectoralis block  Pre-Anesthetic Checklist: ,, timeout performed, Correct Patient, Correct Site, Correct Laterality, Correct Procedure, Correct Position, site marked, Risks and benefits discussed,  Surgical consent,  Pre-op evaluation,  At surgeon's request and post-op pain management  Laterality: Right and Left  Prep: chloraprep       Needles:  Injection technique: Single-shot  Needle Type: Stimiplex     Needle Length: 9cm 9 cm Needle Gauge: 21 G    Additional Needles:  Procedures: ultrasound guided (picture in chart) Pectoralis block Narrative:  Injection made incrementally with aspirations every 5 mL.  Performed by: Personally  Anesthesiologist: Tabias Swayze  Additional Notes: Risks, benefits and alternative to block explained extensively.  Patient tolerated procedure well, without complications.

## 2016-04-22 NOTE — Progress Notes (Signed)
REPORT GIVEN TO PHILIP RN AS CAREGIVER   

## 2016-04-22 NOTE — Interval H&P Note (Signed)
History and Physical Interval Note:  04/22/2016 8:38 AM  Tanya Juarez  has presented today for surgery, with the diagnosis of LEFT BREAST CANCER  The various methods of treatment have been discussed with the patient and family.  Her son, Fritz Pickerel, friend, and pastor at bedside.  Checked with neoprobe.  After consideration of risks, benefits and other options for treatment, the patient has consented to  Procedure(s): LEFT BREAST LUMPECTOMY WITH RADIOACTIVE SEED AND SENTINEL LYMPH NODE BIOPSY (Left) BILATERAL IMMEDIATE MAMMARY REDUCTION  (BREAST) (Bilateral) as a surgical intervention .  The patient's history has been reviewed, patient examined, no change in status, stable for surgery.  I have reviewed the patient's chart and labs.  Questions were answered to the patient's satisfaction.     [name] H

## 2016-04-22 NOTE — Brief Op Note (Signed)
04/22/2016  9:31 AM  PATIENT:  Tanya Juarez  70 y.o. female  PRE-OPERATIVE DIAGNOSIS:  LEFT BREAST CANCER / mammary hypertrophy  POST-OPERATIVE DIAGNOSIS:   same  PROCEDURE:  Procedure(s): LEFT BREAST LUMPECTOMY WITH RADIOACTIVE SEED AND SENTINEL LYMPH NODE BIOPSY (Left) BILATERAL IMMEDIATE MAMMARY REDUCTION  (BREAST) (Bilateral)  SURGEON:  Surgeon(s) and Role: Panel 1:    * Alphonsa Overall, MD - Primary  Panel 2:    * Loel Lofty Dillingham, DO - Primary  PHYSICIAN ASSISTANT: Shawn rayburn, PA  ASSISTANTS: none  ANESTHESIA:   general  EBL:  No intake/output data recorded.  BLOOD ADMINISTERED:none  DRAINS: (2) Jackson-Pratt drain(s) with closed bulb suction in the breast pocket   LOCAL MEDICATIONS USED:  MARCAINE    and LIDOCAINE   SPECIMEN:  Source of Specimen:  breast tissue  DISPOSITION OF SPECIMEN:  PATHOLOGY  COUNTS:  YES  TOURNIQUET:  * No tourniquets in log *  DICTATION: .Dragon Dictation  PLAN OF CARE: Admit for overnight observation  PATIENT DISPOSITION:  PACU - hemodynamically stable.   Delay start of Pharmacological VTE agent (>24hrs) due to surgical blood loss or risk of bleeding: no

## 2016-04-22 NOTE — Op Note (Signed)
04/22/2016  7:48 PM  PATIENT:  Tanya Juarez DOB: March 19, 1946 MRN: [id number]  PREOP DIAGNOSIS:  LEFT BREAST CANCER  POSTOP DIAGNOSIS:   Left breast cancer, 1 o'clock position (T1, N0)  PROCEDURE:   Procedure(s):  LEFT BREAST LUMPECTOMY WITH RADIOACTIVE SEED AND SENTINEL LYMPH NODE BIOPSY - Lucia Gaskins BILATERAL IMMEDIATE MAMMARY REDUCTION  (BREAST) - [facility]  SURGEON:   Alphonsa Overall, M.D.  FIRST ASSISTANT:  Guadlupe Spanish, M.D.  ANESTHESIA:   general  Anesthesiologist: Jillyn Hidden, MD; Effie Berkshire, MD CRNA: Talbot Grumbling, CRNA; Wilburn Cornelia, CRNA; Lavell Luster, CRNA  General  EBL:  150  ml  DRAINS: none   LOCAL MEDICATIONS USED:   None  SPECIMEN:   Breast lumpectomy (painted),  Left breast medial flap (long suture medial,  Short suture cranial), left breast lateral flap (long suture medial, short suture cranial, orange paint adjacent to the lateral margin of biopsy cavity), Superior margin (Red is adjacent to superior margin of lumpectomy cavity), subareolar tissue,  Left axiilary nodes (counts 1,200, 280, and 280)   There is a photo of the orientation of the specimens at the end of this operative note.  COUNTS CORRECT:  YES  INDICATIONS FOR PROCEDURE:  Tanya Juarez is a 70 y.o. (DOB: 03-27-1946) AA female whose primary care physician is CLOWARD,DAVIS L, MD and comes for left  breast lumpectomy and left axillary sentinel lymph node biopsy.   She was seen in the Breast [facility] clinic with Drs. Magrinat and [place].  The plan is for a left breast lumpectomy and left axillary SLNBx.  Her breast are so pendulous, Dr. [name] has seen her and plans a bilateral breast reduction.   The options for breast cancer treatment have been discussed with the patient. She elected to proceed with lumpectomy and axillary sentinel lymph node.     The indications and potential complications of surgery were explained to the patient. Potential complications include, but are not limited to,  bleeding, infection, the need for further surgery, and nerve injury.     She had a I131 seed placed on 04/21/2016 in her left breast at [facility].  Dr. Isaiah Blakes discussed this case with me the day of seed placement and she placed 2 seeds.  I confirmed the presence of the I131 seed in the pre op area using the Neoprobe.  The seed is in the 1 o'clock position of the left breast, at the edge of the areola.   In the holding area, her left areola was injected with 1 millicurie of Technitium Sulfur Colloid.  OPERATIVE NOTE:   The patient was taken to room # 2 at [place] where she underwent a general anesthesia  supervised by Anesthesiologist: Jillyn Hidden, MD; Effie Berkshire, MD CRNA: Talbot Grumbling, CRNA; Wilburn Cornelia, CRNA; Lavell Luster, CRNA. Both her breast and axilla were prepped with ChloraPrep and sterilely draped.    A time-out and the surgical check list was reviewed.    I turned attention to the cancer which was about at the 1 o'clock position of the left breast.   I used the Neoprobe to identify the two I131 seeds.  I tried to excise an area around the tumor of at least 1 cm.  Dr. [name] assisted and, as best I could, I tried to preserve the pedicles for her breast reduction.   I excised this block of breast tissue approximately 13 cm by 12 cm  in diameter.   I painted the lumpectomy specimen with  the 6 color paint kit and did a specimen mammogram which confirmed the mass, 2 clips, and the seed were all in the central position in the specimen.  The specimen was sent to pathology who called back to confirm that they have the 2 seeds and the specimen.   I went through Dr. Eusebio Friendly left breast reduction incision and started the left axillary sentinel lymph node biopsy.   I found a hot area at the junction of the breast and the pectoralis major muscle. I cut down and  identified 3 hot areas/nodes that had counts of 1,200, 250, and 250, and the background has 30 counts.  checked her internal  mammary nodes and supraclavicular nodes with the neoprobe and found no other hot area. The axillary node was then sent to pathology.           I assisted Dr. [name] with some of the flap development on the left side.  We had additional tissue that I oriented for pathology:  left breast medial flap (long suture medial,  Short suture cranial), left breast lateral flap (long suture medial, short suture cranial, orange paint adjacent to the lateral margin of biopsy cavity), Superior margin (Red is adjacent to superior margin of lumpectomy cavity), subareolar tissue.   I spoke to Dr. Saralyn Pilar about these findings.  There is a photo of the orientation of the specimens at the end of this operative note.   Alphonsa Overall, MD, [facility] Surgery Pager: [phone] Office phone:  [phone]

## 2016-04-23 ENCOUNTER — Encounter (HOSPITAL_COMMUNITY): Payer: Self-pay | Admitting: Surgery

## 2016-04-23 DIAGNOSIS — C50412 Malignant neoplasm of upper-outer quadrant of left female breast: Secondary | ICD-10-CM | POA: Diagnosis not present

## 2016-04-23 LAB — GLUCOSE, CAPILLARY
GLUCOSE-CAPILLARY: 156 mg/dL — AB (ref 65–99)
Glucose-Capillary: 170 mg/dL — ABNORMAL HIGH (ref 65–99)

## 2016-04-23 MED ORDER — SENNA 8.6 MG PO TABS: 1.0000 | ORAL_TABLET | Freq: Two times a day (BID) | ORAL | 0 refills | Status: DC

## 2016-04-23 MED ORDER — MUPIROCIN 2 % EX OINT
TOPICAL_OINTMENT | Freq: Two times a day (BID) | CUTANEOUS | Status: DC
  Administered 2016-04-23: 13:00:00 via TOPICAL
  Filled 2016-04-23: qty 22

## 2016-04-23 NOTE — Progress Notes (Signed)
Patient discharged to home with instructions with  drain care, return demonstrated, verbalized understanding.

## 2016-04-23 NOTE — Discharge Summary (Signed)
Physician Discharge Summary  Patient ID: Tanya Juarez MRN: [id number] DOB/AGE: [birth year] 70 y.o.  Admit date: 04/22/2016 Discharge date: 04/23/2016  Admission Diagnoses:  Discharge Diagnoses:  Active Problems:   Breast cancer [facility])   Discharged Condition: good  Hospital Course: The patient was admitted and underwent a left partial mastectomy with immediate reconstruction with reduction bilaterally.  She was recovered on the surgery unit.  She did very well. She was tolerating po and walking in the hall. Her pain was controlled.  Consults: None  Significant Diagnostic Studies:   Treatments: surgery  Discharge Exam: Blood pressure 139/63, pulse 89, temperature 98.5 F (36.9 C), temperature source Oral, resp. rate 18, height 5' 5.5" (1.664 m), weight 108.9 kg (240 lb), SpO2 100 %. General appearance: alert, cooperative and no distress Breasts: normal appearance, no masses or tenderness, no hematome  Disposition: 03-Skilled Nursing Facility     Medication List    TAKE these medications   aspirin 81 MG tablet Take 81 mg by mouth daily.   aspirin EC 325 MG tablet Take 1 tablet (325 mg total) by mouth 2 (two) times daily.   cloNIDine 0.2 MG tablet Commonly known as:  CATAPRES Take 0.2 mg by mouth 2 (two) times daily.   lisinopril-hydrochlorothiazide 20-25 MG tablet Commonly known as:  PRINZIDE,ZESTORETIC Take 1 tablet by mouth daily.   metFORMIN 500 MG 24 hr tablet Commonly known as:  GLUCOPHAGE-XR Take 1 tablet by mouth daily.   senna 8.6 MG Tabs tablet Commonly known as:  SENOKOT Take 1 tablet (8.6 mg total) by mouth 2 (two) times daily.   vitamin B-12 1000 MCG tablet Commonly known as:  CYANOCOBALAMIN Take 1,000 mcg by mouth daily.   Vitamin D3 1000 units Caps Take 1,000 Units by mouth daily.     ASK your doctor about these medications   pravastatin 20 MG tablet Commonly known as:  PRAVACHOL Take 1 tablet by mouth daily.      Follow-up  Information    NEWMAN,DAVID H, MD In 2 weeks.   Specialty:  General Surgery Contact information: [address] [place] [address] [phone]        [name], DO In 1 week.   Specialty:  Plastic Surgery Contact information: [place] [address] [phone]           Signed: Wallace Going 04/23/2016, 3:30 PM

## 2016-04-23 NOTE — Discharge Instructions (Signed)
Drain Care Continue binder or sports bra May shower starting Saturday No heavy lifting.

## 2016-04-23 NOTE — Care Management Obs Status (Signed)
[place] NOTIFICATION   Patient Details  Name: Tanya Juarez MRN: [id number] Date of Birth: 1946/06/15   Medicare Observation Status Notification Given:   (Medicare observation procedure )    Marilu Favre, RN 04/23/2016, 10:51 AM

## 2016-04-23 NOTE — Progress Notes (Signed)
[facility] Office:  [phone] General Surgery Progress Note   LOS: 0 days  POD -  1 Day Post-Op  Assessment/Plan: 1. LEFT BREAST LUMPECTOMY WITH RADIOACTIVE SEED AND SENTINEL LYMPH NODE BIOPSY, BILATERAL IMMEDIATE MAMMARY REDUCTION  (BREAST) - 04/22/2016 - D. Halina [facility]  For left breast cancer  On ancef   Doing well  Plans to go home today  2.  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 HTN x 20 years 4. Has some lower extremity neuropathy 5. Obese - weight - 241, BMI - 37.8 6.  DVT prophylaxis - no chemoprophylaxis   Active Problems:   Breast cancer (HCC)  Subjective:  Did well last night.  Not a lot of pain.  Objective:   Vitals:   04/22/16 2229 04/23/16 0407  BP: 132/84 (!) 147/55  Pulse: 93 92  Resp: 15 16  Temp:  98.3 F (36.8 C)     Intake/Output from previous day:  07/27 0701 - 07/28 0700 In: 2750 [I.V.:2750] Out: 1570 [Urine:1200; Drains:120; Blood:250]  Intake/Output this shift:  No intake/output data recorded.   Physical Exam:   General: WN AA F who is alert and oriented.    HEENT: Normal. Pupils equal. .   Lungs: Clear   Wound: Dressing intact.  Drains - L - 85 cc, R - 35 cc   Lab Results:    Recent Labs  04/22/16 1516  WBC 24.7*  HGB 10.8*  HCT 32.9*  PLT 291    BMET   Recent Labs  04/22/16 1516  NA 131*  K 3.7  CL 101  CO2 24  GLUCOSE 145*  BUN 10  CREATININE 0.82  CALCIUM 8.5*    PT/INR  No results for input(s): LABPROT, INR in the last 72 hours.  ABG  No results for input(s): PHART, HCO3 in the last 72 hours.  Invalid input(s): PCO2, PO2   Studies/Results:  Nm Sentinel Node Inj-no Rpt (breast)  Result Date: 04/22/2016 CLINICAL DATA: left breast cancer Sulfur colloid was injected intradermally by the nuclear medicine technologist for breast cancer sentinel node localization.     Anti-infectives:   Anti-infectives    Start      Dose/Rate Route Frequency Ordered Stop   04/22/16 2100  ceFAZolin (ANCEF) IVPB 2g/100 mL premix     2 g 200 mL/hr over 30 Minutes Intravenous Every 8 hours 04/22/16 1659     04/22/16 1015  polymyxin B 500,000 Units, bacitracin 50,000 Units in sodium chloride irrigation 0.9 % 500 mL irrigation  Status:  Discontinued       As needed 04/22/16 1015 04/22/16 1413   04/22/16 0830  ceFAZolin (ANCEF) IVPB 2g/100 mL premix     2 g 200 mL/hr over 30 Minutes Intravenous To ShortStay Surgical 04/21/16 1327 04/22/16 1330      Alphonsa Overall, MD, FACS Pager: [facility] Office: [phone] 04/23/2016

## 2016-04-30 ENCOUNTER — Encounter (HOSPITAL_COMMUNITY): Payer: Self-pay | Admitting: Surgery

## 2016-05-12 ENCOUNTER — Encounter: Payer: Self-pay | Admitting: Genetic Counselor

## 2016-05-12 ENCOUNTER — Ambulatory Visit
Admission: RE | Admit: 2016-05-12 | Discharge: 2016-05-12 | Disposition: A | Discharge status: Home / Self Care | Payer: Medicare Other | Source: Ambulatory Visit | Attending: Radiation Oncology | Admitting: Radiation Oncology

## 2016-05-12 ENCOUNTER — Telehealth: Payer: Self-pay | Admitting: *Deleted

## 2016-05-12 ENCOUNTER — Ambulatory Visit: Admit: 2016-05-12 | Payer: Medicare Other

## 2016-05-12 DIAGNOSIS — C50112 Malignant neoplasm of central portion of left female breast: Secondary | ICD-10-CM | POA: Insufficient documentation

## 2016-05-12 DIAGNOSIS — Z170 Estrogen receptor positive status [ER+]: Secondary | ICD-10-CM | POA: Insufficient documentation

## 2016-05-12 DIAGNOSIS — Z510 Encounter for antineoplastic radiation therapy: Secondary | ICD-10-CM | POA: Insufficient documentation

## 2016-05-12 NOTE — Telephone Encounter (Signed)
Called and spoke with patient, she thought her appt  Today at 9am was cancelled , she has plastic surgery on 04/22/16 and follow up next Friday, she needs to heal before coming here, she will call us to reschedule after she sees her plastic surgeon, thanked this Rn for calling and checking on her 9:36 AM

## 2016-06-01 ENCOUNTER — Telehealth: Payer: Self-pay | Admitting: Oncology

## 2016-06-01 NOTE — Telephone Encounter (Signed)
06/03/2016 Appointment canceled per patient request. Patient called to cancel appointment per the need to have infection in right breast looked at by plastic surgeon and then call to reschedule appointment.

## 2016-06-03 ENCOUNTER — Ambulatory Visit: Payer: Medicare Other | Admitting: Oncology

## 2016-07-07 NOTE — Progress Notes (Signed)
Location of Breast Cancer:Left Breast  Histology per Pathology Report: 04/22/16  Diagnosis 1. 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 - SECOND NODULE OF DUCTAL CARCINOMA IN SITU, 1.8 CM. - MARGINS NOT INVOLVED. - FIBROADENOMA WITH CALCIFICATIONS. 2. Breast, excision, Left Medial Flap - BENIGN ADIPOSE TISSUE. - NO EVIDENCE OF MALIGNANCY. 3. Lymph node, sentinel, biopsy, Left Axillary - ONE BENIGN LYMPH NODE (0/1). 4. Breast, excision, Left Lateral Flap - FIBROADENOMA WITH CALCIFICATIONS. - FIBROCYSTIC CHANGES. - NO EVIDENCE OF MALIGNANCY. 5. Breast, excision, Left Superior Margin - BENIGN BREAST TISSUE. - NO EVIDENCE OF MALIGNANCY. 6. Breast, excision, Left Subareolar Tissue - FIBROCYSTIC CHANGES. - NO EVIDENCE OF MALIGNANCY. 7. Lymph node, sentinel, biopsy - ONE BENIGN LYMPH NODE (0/1). 8. Lymph node, sentinel, biopsy - ONE BENIGN LYMPH NODE (0/1). 9. Breast, accessory tissue, Left additional contents - BENIGN SKIN AND SUBCUTANEOUS ADIPOSE TISSUE. - NO EVIDENCE OF MALIGNANCY. 10. Skin , Right skin flaps - SEBORRHEIC KERATOSIS. - NO EVIDENCE OF MALIGNANCY. 1 of 5 FINAL for Tanya Juarez, Tanya Juarez ([id number]) Diagnosis(continued) 11. Breast, excision, Right medial flap - BENIGN BREAST TISSUE. - NO EVIDENCE OF MALIGNANCY. 12. Breast, excision, Right lateral flap - FIBROCYSTIC CHANGES WITH CALCIFICATIONS. - FIBROADENOMA. - NO EVIDENCE OF MALIGNANCY. 13. Breast, excision, Right super pedicle - FIBROCYSTIC CHANGES WITH CALCIFICATIONS. - NO EVIDENCE OF MALIGNANCY   Diagnosis- 02/18/16 Breast, left, needle core biopsy - INVASIVE DUCTAL CARCINOMA. - DUCTAL CARCINOMA IN SITU WITH CALCIFICATIONS. - SEE COMMEN Receptor Status: ER(90%), PR (90%), Her2-neu (-), Ki-(67%)  Tanya Juarez had a routine screening mammography at [facility] 02/06/2016. This found new calcifications in the left breastcentral to  the nipple On 05/19/2017In the left breast central to the nipple there were grouped heterogeneous calcifications measuring 2.5 cm. .    Past/Anticipated interventions by surgeon, if any: Dr. Alphonsa Overall - Lumpectomy and Axillary Dissection  Past/Anticipated interventions by medical oncology, if any: Chemotherapy: Tanya Juarez: Anti-estrogens to follow at the completion of local treatment  Lymphedema issues, if any: No   Pain issues, if any:  None  SAFETY ISSUES:  Prior radiation? No  Pacemaker/ICD? No  Possible current pregnancy?No  Is the patient on methotrexate? No  Current Complaints / other details:      Menarche age 15, first live birth age 44, the patient is G2 P2. BC x 1 year only, She stopped having periods in her early 60s. She did not take hormone replacement.    Tanya Evener, RN 07/07/2016,6:49 PM

## 2016-07-08 ENCOUNTER — Ambulatory Visit
Admission: RE | Admit: 2016-07-08 | Discharge: 2016-07-08 | Disposition: A | Discharge status: Home / Self Care | Payer: Medicare Other | Source: Ambulatory Visit | Attending: Radiation Oncology | Admitting: Radiation Oncology

## 2016-07-08 ENCOUNTER — Encounter: Payer: Self-pay | Admitting: Radiation Oncology

## 2016-07-08 DIAGNOSIS — Z170 Estrogen receptor positive status [ER+]: Secondary | ICD-10-CM | POA: Diagnosis not present

## 2016-07-08 DIAGNOSIS — C50112 Malignant neoplasm of central portion of left female breast: Secondary | ICD-10-CM | POA: Diagnosis not present

## 2016-07-08 DIAGNOSIS — Z510 Encounter for antineoplastic radiation therapy: Secondary | ICD-10-CM | POA: Diagnosis present

## 2016-07-08 NOTE — Progress Notes (Addendum)
Radiation Oncology         ([phone]) [phone] ________________________________  Name: Tanya Juarez MRN: [id number]  Date: 07/08/2016  DOB: 1946-06-19  OH:YWVPXTG,GYIRS L, MD  Magrinat, Tanya Dad, MD     REFERRING PHYSICIAN: Magrinat, Tanya Dad, MD   DIAGNOSIS: The encounter diagnosis was Malignant neoplasm of central portion of left breast in female, estrogen receptor positive ([place]). Stage IIA (T2, N0, M0)   HISTORY OF PRESENT ILLNESS:Tanya Juarez is a 70 y.o. female who is seen for an initial consultation visit regarding a left sided breast cancer.  The patient was found to have suspicious findings within the left breast on initial mammogram.  This corresponded to calcifications corresponding to an area of 2.5 cm in the retroareolar region. A diagnostic mammogram and breast ultrasound confirmed this finding. No suspicious findings were seen within the axilla on ultrasound.  A biopsy was performed. 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 Receptors studies were completed and indicate that the tumor is estrogen receptor positive, progesterone receptor positive, and Her-2/neu negative with respect to the small focus of invasive ductal carcinoma. She was taken to the operating room on 04/22/16 where she underwent a left breast lumpectomy with sentinel lymph node assessment. She also had immediate bilateral breast reduction. Final pathology revealed invasive ductal carcinoma measuring .1 cm with extensive DCIS measuring 1.5 and 1.8 cm; all margins were uninvoled. Of the three nodes removed, non contained disease. The right breast was negative for any malignancy as well. Her post surgical course has been delayed by wound healing, and right sided cellulitis. She has been seen weekly by plastic surgery since. She reports she has been doing much better as of late and comes to meet with Dr. Lisbeth Juarez to review the options for radiotherapy.  PAST MEDICAL HISTORY:    Past Medical History:  Diagnosis Date  . Cancer of left breast ([place])   . Diabetic peripheral neuropathy ([place])   . Full dentures   . Hyperlipidemia   . Hypertension   . Neuropathy ([place])   . Type II diabetes mellitus ([place])   . Weakness of both legs     PAST SURGICAL HISTORY: Past Surgical History:  Procedure Laterality Date  . APPENDECTOMY  1971  . BREAST BIOPSY Left 01/2016  . BREAST LUMPECTOMY WITH RADIOACTIVE SEED AND SENTINEL LYMPH NODE BIOPSY Left 04/22/2016   Procedure: LEFT BREAST LUMPECTOMY WITH RADIOACTIVE SEED AND SENTINEL LYMPH NODE BIOPSY;  Surgeon: Alphonsa Overall, MD;  Location: [facility];  Service: General;  Laterality: Left;  . BREAST REDUCTION SURGERY Bilateral 04/22/2016   Procedure: BILATERAL IMMEDIATE MAMMARY REDUCTION  (BREAST);  Surgeon: Loel Lofty [place]illingham, DO;  Location: [facility];  Service: Plastics;  Laterality: Bilateral;  . CESAREAN SECTION     1971  . CESAREAN SECTION WITH BILATERAL TUBAL LIGATION  1973  . FRACTURE SURGERY    . I&D EXTREMITY Left 08/19/2015   Procedure: IRRIGATION AND DEBRIDEMENT LEFT ANKLE FRACTURE;  Surgeon: Leandrew Koyanagi, MD;  Location: [facility];  Service: Orthopedics;  Laterality: Left;  Marland Kitchen MASTECTOMY COMPLETE / SIMPLE W/ SENTINEL NODE BIOPSY Left 04/22/2016   WITH RADIOACTIVE SEED   . ORIF ANKLE FRACTURE Left 08/20/2015   Procedure: OPEN REDUCTION INTERNAL FIXATION (ORIF) LEFT ANKLE FRACTURE, SYNDESMOSIS INJURY;  Surgeon: Leandrew Koyanagi, MD;  Location: [facility];  Service: Orthopedics;  Laterality: Left;  . REDUCTION MAMMAPLASTY Bilateral 04/22/2016     FAMILY HISTORY:  Family History  Problem Relation Age of  Onset  . Liver cancer Mother   . Lung cancer Father   . Hypertension       SOCIAL HISTORY:  reports that she has never smoked. She has never used smokeless tobacco. She reports that she drinks about 1.2 oz of alcohol per week . She reports that she does not use drugs. The patient is widowed and lives in [place].   ALLERGIES:  Amlodipine   MEDICATIONS:  Current Outpatient Prescriptions  Medication Sig Dispense Refill  . aspirin 81 MG tablet Take 81 mg by mouth daily.    . Cholecalciferol (VITAMIN D3) 1000 UNITS CAPS Take 1,000 Units by mouth daily.     . cloNIDine (CATAPRES) 0.2 MG tablet Take 0.2 mg by mouth 2 (two) times daily.    Marland Kitchen lisinopril-hydrochlorothiazide (PRINZIDE,ZESTORETIC) 20-25 MG per tablet Take 1 tablet by mouth daily.    . metFORMIN (GLUCOPHAGE-XR) 500 MG 24 hr tablet Take 1 tablet by mouth daily.    . pravastatin (PRAVACHOL) 20 MG tablet Take 1 tablet by mouth daily.    Marland Kitchen senna (SENOKOT) 8.6 MG TABS tablet Take 1 tablet (8.6 mg total) by mouth 2 (two) times daily. 120 each 0  . vitamin B-12 (CYANOCOBALAMIN) 1000 MCG tablet Take 1,000 mcg by mouth daily.     No current facility-administered medications for this encounter.      REVIEW OF SYSTEMS:  On review of systems, the patient reports that she is doing well overall. She is pleased with her reduction and is almost healed. She denies any chest pain, shortness of breath, cough, fevers, chills, night sweats, unintended weight changes. She denies any bowel or bladder disturbances, and denies abdominal pain, nausea or vomiting. She denies any new musculoskeletal or joint aches or pains. A complete review of systems is obtained and is otherwise negative.     PHYSICAL EXAM:  height is 5' 5.5" (1.664 m) and weight is 231 lb 12.8 oz (105.1 kg). Her temperature is 98.5 F (36.9 C). Her blood pressure is 154/69 (abnormal) and her pulse is 79. Her respiration is 20 and oxygen saturation is 100%.   In general this is a well appearing African American female in no acute distress. She's alert and oriented x4 and appropriate throughout the examination. Cardiopulmonary assessment is negative for acute distress and she exhibits normal effort. Bilateral breasts are examined. She has about 2 cm of inframammary fold, and the bulk of her breast tissue has been  resected. She has inframammary and key hole incisions around the areola. The incisions are healed, though the right key hole incision is still healing with a small eschar. The left areola has minimal skin evulsion with persistent healing.     ECOG = 1  0 - Asymptomatic (Fully active, able to carry on all predisease activities without restriction)  1 - Symptomatic but completely ambulatory (Restricted in physically strenuous activity but ambulatory and able to carry out work of a light or sedentary nature. For example, light housework, office work)  2 - Symptomatic, <50% in bed during the day (Ambulatory and capable of all self care but unable to carry out any work activities. Up and about more than 50% of waking hours)  3 - Symptomatic, >50% in bed, but not bedbound (Capable of only limited self-care, confined to bed or chair 50% or more of waking hours)  4 - Bedbound (Completely disabled. Cannot carry on any self-care. Totally confined to bed or chair)  5 - Death   Lolita Rieger, [facility], [place]  [place], et al. (1982). "Toxicity and response criteria of the [facility]". [place] Oncol. 5 (6): 649-55   LABORATORY DATA:  Lab Results  Component Value Date   WBC 24.7 (H) 04/22/2016   HGB 10.8 (L) 04/22/2016   HCT 32.9 (L) 04/22/2016   MCV 91.9 04/22/2016   PLT 291 04/22/2016   Lab Results  Component Value Date   NA 131 (L) 04/22/2016   K 3.7 04/22/2016   CL 101 04/22/2016   CO2 24 04/22/2016   Lab Results  Component Value Date   ALT 14 03/03/2016   AST 11 03/03/2016   ALKPHOS 49 03/03/2016   BILITOT 0.58 03/03/2016      RADIOGRAPHY: No results found.     IMPRESSION/PLAN:  1. 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 Dr. Lisbeth Juarez discusses the pathology findings and reviews the nature of invasive and insitu disease. He discusses her pathology and recommends that once she has healed another few weeks, he  would recommend external radiotherapy to the breast followed by antiestrogen therapy. We discussed the risks, benefits, short, and long term effects of radiotherapy, and the patient is interested in proceeding. Dr. Lisbeth Juarez discusses the delivery and logistics of radiotherapy. We will see her back about 2 weeks for simulation and she will proceed with a hypofractionated treatment with 20 fractions over 4 weeks.    The above documentation reflects my direct findings during this shared patient visit. Please see the separate note by Dr. Lisbeth Juarez on this date for the remainder of the patient's plan of care.     Carola Rhine, PAC

## 2016-07-21 ENCOUNTER — Ambulatory Visit
Admission: RE | Admit: 2016-07-21 | Discharge: 2016-07-21 | Disposition: A | Discharge status: Home / Self Care | Payer: Medicare Other | Source: Ambulatory Visit | Attending: Radiation Oncology | Admitting: Radiation Oncology

## 2016-07-21 DIAGNOSIS — Z510 Encounter for antineoplastic radiation therapy: Secondary | ICD-10-CM | POA: Diagnosis not present

## 2016-07-21 DIAGNOSIS — Z170 Estrogen receptor positive status [ER+]: Principal | ICD-10-CM

## 2016-07-21 DIAGNOSIS — C50112 Malignant neoplasm of central portion of left female breast: Secondary | ICD-10-CM

## 2016-07-22 NOTE — Progress Notes (Signed)
  Radiation Oncology         [phone]) [phone] ________________________________  Name: Tanya Juarez MRN: [id number]  Date: 07/21/2016  DOB: Dec 20, 1945  Optical Surface Tracking Plan:  Since intensity modulated radiotherapy (IMRT) and 3D conformal radiation treatment methods are predicated on accurate and precise positioning for treatment, intrafraction motion monitoring is medically necessary to ensure accurate and safe treatment delivery.  The ability to quantify intrafraction motion without excessive ionizing radiation dose can only be performed with optical surface tracking. Accordingly, surface imaging offers the opportunity to obtain 3D measurements of patient position throughout IMRT and 3D treatments without excessive radiation exposure.  I am ordering optical surface tracking for this patient's upcoming course of radiotherapy. ________________________________  Kyung Rudd, MD 07/22/2016 6:02 PM    Reference:   Particia Jasper, et al. Surface imaging-based analysis of intrafraction motion for breast radiotherapy patients.Journal of [place], n. 6, nov. 2014. ISSN [id number].   Available at: <http://www.jacmp.org/index.php/jacmp/article/view/4957>.

## 2016-07-22 NOTE — Progress Notes (Signed)
  Radiation Oncology         [phone] ________________________________  Name: Tanya Juarez MRN: [id number]  Date: 07/21/2016  DOB: [birth year]   DIAGNOSIS:     ICD-9-CM ICD-10-CM   1. Malignant neoplasm of central portion of left breast in female, estrogen receptor positive (HCC) 174.1 C50.112    V86.0 Z17.0     SIMULATION AND TREATMENT PLANNING NOTE  The patient presented for simulation prior to beginning her course of radiation treatment for her diagnosis of left-sided breast cancer. The patient was placed in a supine position on a breast board. A customized vac-lock bag was constructed and this complex treatment device will be used on a daily basis during her treatment. In this fashion, a CT scan was obtained through the chest area and an isocenter was placed near the chest wall within the breast.  The patient will be planned to receive a course of radiation initially to a dose of 42.5 Gy. This will consist of a whole breast radiotherapy technique. To accomplish this, 2 customized blocks have been designed which will correspond to medial and lateral whole breast tangent fields. This treatment will be accomplished at 2.5 Gy per fraction. A forward planning technique will also be evaluated to determine if this approach improves the plan. It is anticipated that the patient will then receive a 7.5 Gy boost to the seroma cavity which has been contoured. This will be accomplished at 2.5 Gy per fraction.   This initial treatment will consist of a 3-D conformal technique. The seroma has been contoured as the primary target structure. Additionally, dose volume histograms of both this target as well as the lungs and heart will also be evaluated. Such an approach is necessary to ensure that the target area is adequately covered while the nearby critical  normal structures are adequately spared.  Plan:  The final anticipated total dose therefore will correspond to 50 Gy.   Special treatment  procedure was performed today due to the extra time and effort required by myself to plan and prepare this patient for deep inspiration breath hold technique.  I have determined cardiac sparing to be of benefit to this patient to prevent long term cardiac damage due to radiation of the heart.  Bellows were placed on the patient's abdomen. To facilitate cardiac sparing, the patient was coached by the radiation therapists on breath hold techniques and breathing practice was performed. Practice waveforms were obtained. The patient was then scanned while maintaining breath hold in the treatment position.  This image was then transferred over to the imaging specialist. The imaging specialist then created a fusion of the free breathing and breath hold scans using the chest wall as the stable structure. I personally reviewed the fusion in axial, coronal and sagittal image planes.  Excellent cardiac sparing was obtained.  I felt the patient is an appropriate candidate for breath hold and the patient will be treated as such.  The image fusion was then reviewed with the patient to reinforce the necessity of reproducible breath hold.      _______________________________   Jodelle Gross, MD, PhD

## 2016-07-27 DIAGNOSIS — Z510 Encounter for antineoplastic radiation therapy: Secondary | ICD-10-CM | POA: Diagnosis not present

## 2016-07-30 ENCOUNTER — Ambulatory Visit
Admission: RE | Admit: 2016-07-30 | Discharge: 2016-07-30 | Disposition: A | Discharge status: Home / Self Care | Payer: Medicare Other | Source: Ambulatory Visit | Attending: Radiation Oncology | Admitting: Radiation Oncology

## 2016-07-30 DIAGNOSIS — Z510 Encounter for antineoplastic radiation therapy: Secondary | ICD-10-CM | POA: Diagnosis not present

## 2016-08-02 ENCOUNTER — Ambulatory Visit
Admission: RE | Admit: 2016-08-02 | Discharge: 2016-08-02 | Disposition: A | Discharge status: Home / Self Care | Payer: Medicare Other | Source: Ambulatory Visit | Attending: Radiation Oncology | Admitting: Radiation Oncology

## 2016-08-02 DIAGNOSIS — Z510 Encounter for antineoplastic radiation therapy: Secondary | ICD-10-CM | POA: Diagnosis not present

## 2016-08-03 ENCOUNTER — Ambulatory Visit
Admission: RE | Admit: 2016-08-03 | Discharge: 2016-08-03 | Disposition: A | Discharge status: Home / Self Care | Payer: Medicare Other | Source: Ambulatory Visit | Attending: Radiation Oncology | Admitting: Radiation Oncology

## 2016-08-03 DIAGNOSIS — Z510 Encounter for antineoplastic radiation therapy: Secondary | ICD-10-CM | POA: Diagnosis not present

## 2016-08-04 ENCOUNTER — Ambulatory Visit
Admission: RE | Admit: 2016-08-04 | Discharge: 2016-08-04 | Disposition: A | Discharge status: Home / Self Care | Payer: Medicare Other | Source: Ambulatory Visit | Attending: Radiation Oncology | Admitting: Radiation Oncology

## 2016-08-04 ENCOUNTER — Encounter: Payer: Self-pay | Admitting: Radiation Oncology

## 2016-08-04 VITALS — BP 165/91 | HR 77 | Temp 98.5°F | Resp 18 | Ht 65.5 in | Wt 236.4 lb

## 2016-08-04 DIAGNOSIS — C50112 Malignant neoplasm of central portion of left female breast: Secondary | ICD-10-CM

## 2016-08-04 DIAGNOSIS — Z923 Personal history of irradiation: Secondary | ICD-10-CM

## 2016-08-04 DIAGNOSIS — Z5189 Encounter for other specified aftercare: Secondary | ICD-10-CM | POA: Insufficient documentation

## 2016-08-04 DIAGNOSIS — Z7982 Long term (current) use of aspirin: Secondary | ICD-10-CM

## 2016-08-04 DIAGNOSIS — Z79899 Other long term (current) drug therapy: Secondary | ICD-10-CM

## 2016-08-04 DIAGNOSIS — Z510 Encounter for antineoplastic radiation therapy: Secondary | ICD-10-CM | POA: Diagnosis not present

## 2016-08-04 DIAGNOSIS — Z7984 Long term (current) use of oral hypoglycemic drugs: Secondary | ICD-10-CM | POA: Insufficient documentation

## 2016-08-04 MED ORDER — RADIAPLEXRX EX GEL
Freq: Once | CUTANEOUS | Status: AC
  Administered 2016-08-04: 17:00:00 via TOPICAL

## 2016-08-04 MED ORDER — ALRA NON-METALLIC DEODORANT (RAD-ONC)
1.0000 "application " | Freq: Once | TOPICAL | Status: AC
  Administered 2016-08-04: 1 via TOPICAL

## 2016-08-04 NOTE — Progress Notes (Signed)
Mrs. Maczka has received 3 radiation treatments to left breast. Skin to left breast with scaring from plastic surgery otherwise mild hyperpigmentation.  Radiaplex gel given and deodorant with instructions.  Appetite is good.  Having mild fatigue. Denies pain.  Patient teaching reviewed discussed fatigue, skin changes, hair loss,tenderness and swelling.  See documentation under the education area. Wt Readings from Last 3 Encounters:  08/04/16 236 lb 6.4 oz (107.2 kg)  07/08/16 231 lb 12.8 oz (105.1 kg)  04/22/16 240 lb (108.9 kg)  BP (!) 165/91   Pulse 77   Temp 98.5 F (36.9 C) (Oral)   Resp 18   Ht 5' 5.5" (1.664 m)   Wt 236 lb 6.4 oz (107.2 kg)   SpO2 95%   BMI 38.74 kg/m

## 2016-08-04 NOTE — Progress Notes (Signed)
   Department of Radiation Oncology  Phone:  [phone] Fax:        [phone]  Weekly Treatment Note    Name: Tanya Juarez Date: 08/05/2016 MRN: [id number] DOB: [birth year]   Diagnosis:     ICD-9-CM ICD-10-CM   1. Malignant neoplasm of central portion of left female breast, unspecified estrogen receptor status (HCC) 174.1 C50.112 hyaluronate sodium (RADIAPLEXRX) gel     non-metallic deodorant (ALRA) 1 application     Current dose: 7.5 Gy  Current fraction: 3   MEDICATIONS: Current Outpatient Prescriptions  Medication Sig Dispense Refill  . aspirin 81 MG tablet Take 81 mg by mouth daily.    . Cholecalciferol (VITAMIN D3) 1000 UNITS CAPS Take 1,000 Units by mouth daily.     . cloNIDine (CATAPRES) 0.2 MG tablet Take 0.2 mg by mouth 2 (two) times daily.    . hyaluronate sodium (RADIAPLEXRX) GEL Apply 1 application topically 2 (two) times daily.    Marland Kitchen lisinopril-hydrochlorothiazide (PRINZIDE,ZESTORETIC) 20-25 MG per tablet Take 1 tablet by mouth daily.    . metFORMIN (GLUCOPHAGE-XR) 500 MG 24 hr tablet Take 1 tablet by mouth daily.    . non-metallic deodorant Jethro Poling) MISC Apply 1 application topically daily.    . pravastatin (PRAVACHOL) 20 MG tablet Take 1 tablet by mouth daily.    . vitamin B-12 (CYANOCOBALAMIN) 1000 MCG tablet Take 1,000 mcg by mouth daily.    Marland Kitchen senna (SENOKOT) 8.6 MG TABS tablet Take 1 tablet (8.6 mg total) by mouth 2 (two) times daily. (Patient not taking: Reported on 08/04/2016) 120 each 0   No current facility-administered medications for this encounter.      ALLERGIES: Amlodipine   LABORATORY DATA:  Lab Results  Component Value Date   WBC 24.7 (H) 04/22/2016   HGB 10.8 (L) 04/22/2016   HCT 32.9 (L) 04/22/2016   MCV 91.9 04/22/2016   PLT 291 04/22/2016   Lab Results  Component Value Date   NA 131 (L) 04/22/2016   K 3.7 04/22/2016   CL 101 04/22/2016   CO2 24 04/22/2016   Lab Results  Component Value Date   ALT 14 03/03/2016   AST 11  03/03/2016   ALKPHOS 49 03/03/2016   BILITOT 0.58 03/03/2016     NARRATIVE: Tanya Juarez was seen today for weekly treatment management. The chart was checked and the patient's films were reviewed.  Mrs. Qu has received 3 radiation treatments to the left breast. Nursing notes, skin to left breast with scaring from plastic surgery otherwise mild hyperpigmentation. Appetite is good. Reports having mild fatigue.   PHYSICAL EXAMINATION: height is 5' 5.5" (1.664 m) and weight is 236 lb 6.4 oz (107.2 kg). Her oral temperature is 98.5 F (36.9 C). Her blood pressure is 165/91 (abnormal) and her pulse is 77. Her respiration is 18 and oxygen saturation is 95%.      Alert and in no acute distress.  ASSESSMENT: The patient is doing satisfactorily with treatment.  PLAN: We will continue with the patient's radiation treatment as planned.     ------------------------------------------------  Jodelle Gross, MD, PhD  This document serves as a record of services personally performed by Kyung Rudd, MD. It was created on his behalf by Arlyce Harman, a trained medical scribe. The creation of this record is based on the scribe's personal observations and the provider's statements to them. This document has been checked and approved by the attending provider.

## 2016-08-05 ENCOUNTER — Ambulatory Visit
Admission: RE | Admit: 2016-08-05 | Discharge: 2016-08-05 | Disposition: A | Discharge status: Home / Self Care | Payer: Medicare Other | Source: Ambulatory Visit | Attending: Radiation Oncology | Admitting: Radiation Oncology

## 2016-08-05 ENCOUNTER — Ambulatory Visit: Admission: RE | Admit: 2016-08-05 | Payer: Medicare Other | Source: Ambulatory Visit

## 2016-08-05 DIAGNOSIS — Z510 Encounter for antineoplastic radiation therapy: Secondary | ICD-10-CM | POA: Diagnosis not present

## 2016-08-06 ENCOUNTER — Ambulatory Visit
Admission: RE | Admit: 2016-08-06 | Discharge: 2016-08-06 | Disposition: A | Discharge status: Home / Self Care | Payer: Medicare Other | Source: Ambulatory Visit | Attending: Radiation Oncology | Admitting: Radiation Oncology

## 2016-08-06 ENCOUNTER — Encounter: Payer: Self-pay | Admitting: Radiation Oncology

## 2016-08-06 DIAGNOSIS — Z510 Encounter for antineoplastic radiation therapy: Secondary | ICD-10-CM | POA: Diagnosis not present

## 2016-08-09 ENCOUNTER — Ambulatory Visit
Admission: RE | Admit: 2016-08-09 | Discharge: 2016-08-09 | Disposition: A | Discharge status: Home / Self Care | Payer: Medicare Other | Source: Ambulatory Visit | Attending: Radiation Oncology | Admitting: Radiation Oncology

## 2016-08-09 ENCOUNTER — Encounter: Payer: Self-pay | Admitting: *Deleted

## 2016-08-09 DIAGNOSIS — Z510 Encounter for antineoplastic radiation therapy: Secondary | ICD-10-CM | POA: Diagnosis not present

## 2016-08-09 NOTE — Progress Notes (Signed)
Relate doing well with xrt and without complaints. Denies questions or concerns. Encourage pt to call with needs. Received verbal understanding.

## 2016-08-10 ENCOUNTER — Ambulatory Visit
Admission: RE | Admit: 2016-08-10 | Discharge: 2016-08-10 | Disposition: A | Discharge status: Home / Self Care | Payer: Medicare Other | Source: Ambulatory Visit | Attending: Radiation Oncology | Admitting: Radiation Oncology

## 2016-08-10 DIAGNOSIS — Z888 Allergy status to other drugs, medicaments and biological substances status: Secondary | ICD-10-CM | POA: Insufficient documentation

## 2016-08-10 DIAGNOSIS — Z170 Estrogen receptor positive status [ER+]: Secondary | ICD-10-CM | POA: Diagnosis not present

## 2016-08-10 DIAGNOSIS — C50112 Malignant neoplasm of central portion of left female breast: Secondary | ICD-10-CM | POA: Diagnosis not present

## 2016-08-10 DIAGNOSIS — Z510 Encounter for antineoplastic radiation therapy: Secondary | ICD-10-CM | POA: Insufficient documentation

## 2016-08-10 DIAGNOSIS — Z79899 Other long term (current) drug therapy: Secondary | ICD-10-CM | POA: Diagnosis not present

## 2016-08-10 DIAGNOSIS — Z7982 Long term (current) use of aspirin: Secondary | ICD-10-CM | POA: Diagnosis not present

## 2016-08-10 DIAGNOSIS — Z7984 Long term (current) use of oral hypoglycemic drugs: Secondary | ICD-10-CM | POA: Insufficient documentation

## 2016-08-11 ENCOUNTER — Ambulatory Visit
Admission: RE | Admit: 2016-08-11 | Discharge: 2016-08-11 | Disposition: A | Discharge status: Home / Self Care | Payer: Medicare Other | Source: Ambulatory Visit | Attending: Radiation Oncology | Admitting: Radiation Oncology

## 2016-08-11 DIAGNOSIS — Z510 Encounter for antineoplastic radiation therapy: Secondary | ICD-10-CM | POA: Diagnosis not present

## 2016-08-12 ENCOUNTER — Ambulatory Visit
Admission: RE | Admit: 2016-08-12 | Discharge: 2016-08-12 | Disposition: A | Discharge status: Home / Self Care | Payer: Medicare Other | Source: Ambulatory Visit | Attending: Radiation Oncology | Admitting: Radiation Oncology

## 2016-08-12 DIAGNOSIS — Z510 Encounter for antineoplastic radiation therapy: Secondary | ICD-10-CM | POA: Diagnosis not present

## 2016-08-13 ENCOUNTER — Encounter: Payer: Self-pay | Admitting: Radiation Oncology

## 2016-08-13 ENCOUNTER — Ambulatory Visit
Admission: RE | Admit: 2016-08-13 | Discharge: 2016-08-13 | Disposition: A | Discharge status: Home / Self Care | Payer: Medicare Other | Source: Ambulatory Visit | Attending: Radiation Oncology | Admitting: Radiation Oncology

## 2016-08-13 VITALS — BP 183/74 | HR 66 | Temp 98.3°F | Resp 20

## 2016-08-13 DIAGNOSIS — Z170 Estrogen receptor positive status [ER+]: Principal | ICD-10-CM

## 2016-08-13 DIAGNOSIS — Z510 Encounter for antineoplastic radiation therapy: Secondary | ICD-10-CM | POA: Diagnosis not present

## 2016-08-13 DIAGNOSIS — C50112 Malignant neoplasm of central portion of left female breast: Secondary | ICD-10-CM

## 2016-08-13 NOTE — Progress Notes (Addendum)
Weekly rad xs left breast 10/20 completed, Mild erythmea, and tanning, skin intact, using radiaplex bid, appetie good, no pain BP (!) 183/74 (BP Location: Right Arm, Patient Position: Sitting, Cuff Size: Normal)   Pulse 66   Temp 98.3 F (36.8 C) (Oral)   Resp 20   Wt Readings from Last 3 Encounters:  08/04/16 236 lb 6.4 oz (107.2 kg)  07/08/16 231 lb 12.8 oz (105.1 kg)  04/22/16 240 lb (108.9 kg)

## 2016-08-15 ENCOUNTER — Ambulatory Visit
Admission: RE | Admit: 2016-08-15 | Discharge: 2016-08-15 | Disposition: A | Discharge status: Home / Self Care | Payer: Medicare Other | Source: Ambulatory Visit | Attending: Radiation Oncology | Admitting: Radiation Oncology

## 2016-08-15 DIAGNOSIS — Z510 Encounter for antineoplastic radiation therapy: Secondary | ICD-10-CM | POA: Diagnosis not present

## 2016-08-15 NOTE — Progress Notes (Signed)
   Department of Radiation Oncology  Phone:  [phone] Fax:        [phone]  Weekly Treatment Note    Name: Tanya Juarez Date: 08/15/2016 MRN: [id number] DOB: 1946/01/08   Diagnosis:     ICD-9-CM ICD-10-CM   1. Malignant neoplasm of central portion of left breast in female, estrogen receptor positive ([place]) 174.1 C50.112    V86.0 Z17.0      Current dose: 25 Gy  Current fraction: 10   MEDICATIONS: Current Outpatient Prescriptions  Medication Sig Dispense Refill  . aspirin 81 MG tablet Take 81 mg by mouth daily.    . Cholecalciferol (VITAMIN D3) 1000 UNITS CAPS Take 1,000 Units by mouth daily.     . cloNIDine (CATAPRES) 0.2 MG tablet Take 0.2 mg by mouth 2 (two) times daily.    . hyaluronate sodium (RADIAPLEXRX) GEL Apply 1 application topically 2 (two) times daily.    Marland Kitchen lisinopril-hydrochlorothiazide (PRINZIDE,ZESTORETIC) 20-25 MG per tablet Take 1 tablet by mouth daily.    . metFORMIN (GLUCOPHAGE-XR) 500 MG 24 hr tablet Take 1 tablet by mouth daily.    . non-metallic deodorant Jethro Poling) MISC Apply 1 application topically daily.    . pravastatin (PRAVACHOL) 20 MG tablet Take 1 tablet by mouth daily.    . vitamin B-12 (CYANOCOBALAMIN) 1000 MCG tablet Take 1,000 mcg by mouth daily.     No current facility-administered medications for this encounter.      ALLERGIES: Amlodipine   LABORATORY DATA:  Lab Results  Component Value Date   WBC 24.7 (H) 04/22/2016   HGB 10.8 (L) 04/22/2016   HCT 32.9 (L) 04/22/2016   MCV 91.9 04/22/2016   PLT 291 04/22/2016   Lab Results  Component Value Date   NA 131 (L) 04/22/2016   K 3.7 04/22/2016   CL 101 04/22/2016   CO2 24 04/22/2016   Lab Results  Component Value Date   ALT 14 03/03/2016   AST 11 03/03/2016   ALKPHOS 49 03/03/2016   BILITOT 0.58 03/03/2016     NARRATIVE: Tanya Juarez was seen today for weekly treatment management. The chart was checked and the patient's films were reviewed.  Weekly rad xs left  breast 10/20 completed, Mild erythmea, and tanning, skin intact, using radiaplex bid, appetie good, no pain BP (!) 183/74 (BP Location: Right Arm, Patient Position: Sitting, Cuff Size: Normal)   Pulse 66   Temp 98.3 F (36.8 C) (Oral)   Resp 20   Wt Readings from Last 3 Encounters:  08/04/16 236 lb 6.4 oz (107.2 kg)  07/08/16 231 lb 12.8 oz (105.1 kg)  04/22/16 240 lb (108.9 kg)    PHYSICAL EXAMINATION: oral temperature is 98.3 F (36.8 C). Her blood pressure is 183/74 (abnormal) and her pulse is 66. Her respiration is 20.        ASSESSMENT: The patient is doing satisfactorily with treatment.  PLAN: We will continue with the patient's radiation treatment as planned.

## 2016-08-16 ENCOUNTER — Ambulatory Visit
Admission: RE | Admit: 2016-08-16 | Discharge: 2016-08-16 | Disposition: A | Discharge status: Home / Self Care | Payer: Medicare Other | Source: Ambulatory Visit | Attending: Radiation Oncology | Admitting: Radiation Oncology

## 2016-08-16 DIAGNOSIS — Z510 Encounter for antineoplastic radiation therapy: Secondary | ICD-10-CM | POA: Diagnosis not present

## 2016-08-17 ENCOUNTER — Ambulatory Visit
Admission: RE | Admit: 2016-08-17 | Discharge: 2016-08-17 | Disposition: A | Discharge status: Home / Self Care | Payer: Medicare Other | Source: Ambulatory Visit | Attending: Radiation Oncology | Admitting: Radiation Oncology

## 2016-08-17 DIAGNOSIS — Z510 Encounter for antineoplastic radiation therapy: Secondary | ICD-10-CM | POA: Diagnosis not present

## 2016-08-18 ENCOUNTER — Ambulatory Visit
Admission: RE | Admit: 2016-08-18 | Discharge: 2016-08-18 | Disposition: A | Discharge status: Home / Self Care | Payer: Medicare Other | Source: Ambulatory Visit | Attending: Radiation Oncology | Admitting: Radiation Oncology

## 2016-08-18 ENCOUNTER — Encounter: Payer: Self-pay | Admitting: Radiation Oncology

## 2016-08-18 DIAGNOSIS — Z510 Encounter for antineoplastic radiation therapy: Secondary | ICD-10-CM | POA: Diagnosis not present

## 2016-08-20 ENCOUNTER — Ambulatory Visit: Payer: Medicare Other

## 2016-08-23 ENCOUNTER — Encounter: Payer: Self-pay | Admitting: Radiation Oncology

## 2016-08-23 ENCOUNTER — Ambulatory Visit: Payer: Medicare Other | Admitting: Radiation Oncology

## 2016-08-23 ENCOUNTER — Ambulatory Visit
Admission: RE | Admit: 2016-08-23 | Discharge: 2016-08-23 | Disposition: A | Discharge status: Home / Self Care | Payer: Medicare Other | Source: Ambulatory Visit | Attending: Radiation Oncology | Admitting: Radiation Oncology

## 2016-08-23 VITALS — BP 152/76 | HR 71 | Temp 98.0°F | Ht 65.5 in | Wt 232.0 lb

## 2016-08-23 DIAGNOSIS — C50112 Malignant neoplasm of central portion of left female breast: Secondary | ICD-10-CM

## 2016-08-23 DIAGNOSIS — Z170 Estrogen receptor positive status [ER+]: Principal | ICD-10-CM

## 2016-08-23 DIAGNOSIS — Z510 Encounter for antineoplastic radiation therapy: Secondary | ICD-10-CM | POA: Diagnosis not present

## 2016-08-23 NOTE — Progress Notes (Signed)
Department of Radiation Oncology  Phone:  [phone] Fax:        [phone]  Weekly Treatment Note    Name: Tanya Juarez Date: 08/23/2016 MRN: [id number] DOB: Nov 15, 1945   Diagnosis:     ICD-9-CM ICD-10-CM   1. Malignant neoplasm of central portion of left breast in female, estrogen receptor positive ([place]) 174.1 C50.112    V86.0 Z17.0      Current dose: 37.5 Gy  Current fraction: 15   MEDICATIONS: Current Outpatient Prescriptions  Medication Sig Dispense Refill  . aspirin 81 MG tablet Take 81 mg by mouth daily.    . Cholecalciferol (VITAMIN D3) 1000 UNITS CAPS Take 1,000 Units by mouth daily.     . cloNIDine (CATAPRES) 0.2 MG tablet Take 0.2 mg by mouth 2 (two) times daily.    . hyaluronate sodium (RADIAPLEXRX) GEL Apply 1 application topically 2 (two) times daily.    Marland Kitchen lisinopril-hydrochlorothiazide (PRINZIDE,ZESTORETIC) 20-25 MG per tablet Take 1 tablet by mouth daily.    . metFORMIN (GLUCOPHAGE-XR) 500 MG 24 hr tablet Take 1 tablet by mouth daily.    . non-metallic deodorant Jethro Poling) MISC Apply 1 application topically daily.    . pravastatin (PRAVACHOL) 20 MG tablet Take 1 tablet by mouth daily.    . vitamin B-12 (CYANOCOBALAMIN) 1000 MCG tablet Take 1,000 mcg by mouth daily.     No current facility-administered medications for this encounter.      ALLERGIES: Amlodipine   LABORATORY DATA:  Lab Results  Component Value Date   WBC 24.7 (H) 04/22/2016   HGB 10.8 (L) 04/22/2016   HCT 32.9 (L) 04/22/2016   MCV 91.9 04/22/2016   PLT 291 04/22/2016   Lab Results  Component Value Date   NA 131 (L) 04/22/2016   K 3.7 04/22/2016   CL 101 04/22/2016   CO2 24 04/22/2016   Lab Results  Component Value Date   ALT 14 03/03/2016   AST 11 03/03/2016   ALKPHOS 49 03/03/2016   BILITOT 0.58 03/03/2016     NARRATIVE: Tanya Juarez was seen today for weekly treatment management. The chart was checked and the patient's films were reviewed.  The patient has  completed her 15th treatment to her left breast. She denies pain. The patient reports fatigue, especially in the afternoon at home. She reports she is using radiaplex bid as directed.  BP (!) 152/76   Pulse 71   Temp 98 F (36.7 C)   Ht 5' 5.5" (1.664 m)   Wt 232 lb (105.2 kg)   SpO2 100% Comment: room air  BMI 38.02 kg/m   Wt Readings from Last 3 Encounters:  08/23/16 232 lb (105.2 kg)  08/04/16 236 lb 6.4 oz (107.2 kg)  07/08/16 231 lb 12.8 oz (105.1 kg)    PHYSICAL EXAMINATION: height is 5' 5.5" (1.664 m) and weight is 232 lb (105.2 kg). Her temperature is 98 F (36.7 C). Her blood pressure is 152/76 (abnormal) and her pulse is 71. Her oxygen saturation is 100%.      Diffuse mild erythema without any desquamation noted.  ASSESSMENT: The patient is doing satisfactorily with treatment.  PLAN: We will continue with the patient's radiation treatment as planned.   ------------------------------------------------  Jodelle Gross, MD, PhD  This document serves as a record of services personally performed by Kyung Rudd, MD. It was created on his behalf by Maryla Morrow, a trained medical scribe. The creation of this record is based on the scribe's personal observations and the provider's  statements to them. This document has been checked and approved by the attending provider.  

## 2016-08-23 NOTE — Progress Notes (Signed)
Tanya Juarez presents for her 15th fraction of radiation to her Left Breast. She denies pain. She reports fatigue, especially in the afternoon at home. Her Left Breast is hyperpigmented. She has scar tissue present underneath her Left breast and to her Left Nipple. She is using the Radiaplex twice daily as directed.  BP (!) 152/76   Pulse 71   Temp 98 F (36.7 C)   Ht 5' 5.5" (1.664 m)   Wt 232 lb (105.2 kg)   SpO2 100% Comment: room air  BMI 38.02 kg/m    Wt Readings from Last 3 Encounters:  08/23/16 232 lb (105.2 kg)  08/04/16 236 lb 6.4 oz (107.2 kg)  07/08/16 231 lb 12.8 oz (105.1 kg)

## 2016-08-24 ENCOUNTER — Ambulatory Visit
Admission: RE | Admit: 2016-08-24 | Discharge: 2016-08-24 | Disposition: A | Discharge status: Home / Self Care | Payer: Medicare Other | Source: Ambulatory Visit | Attending: Radiation Oncology | Admitting: Radiation Oncology

## 2016-08-24 DIAGNOSIS — Z510 Encounter for antineoplastic radiation therapy: Secondary | ICD-10-CM | POA: Diagnosis not present

## 2016-08-25 ENCOUNTER — Ambulatory Visit
Admission: RE | Admit: 2016-08-25 | Discharge: 2016-08-25 | Disposition: A | Discharge status: Home / Self Care | Payer: Medicare Other | Source: Ambulatory Visit | Attending: Radiation Oncology | Admitting: Radiation Oncology

## 2016-08-25 DIAGNOSIS — Z510 Encounter for antineoplastic radiation therapy: Secondary | ICD-10-CM | POA: Diagnosis not present

## 2016-08-26 ENCOUNTER — Ambulatory Visit: Payer: Medicare Other | Admitting: Radiation Oncology

## 2016-08-26 ENCOUNTER — Ambulatory Visit
Admission: RE | Admit: 2016-08-26 | Discharge: 2016-08-26 | Disposition: A | Discharge status: Home / Self Care | Payer: Medicare Other | Source: Ambulatory Visit | Attending: Radiation Oncology | Admitting: Radiation Oncology

## 2016-08-26 DIAGNOSIS — Z510 Encounter for antineoplastic radiation therapy: Secondary | ICD-10-CM | POA: Diagnosis not present

## 2016-08-27 ENCOUNTER — Ambulatory Visit
Admission: RE | Admit: 2016-08-27 | Discharge: 2016-08-27 | Disposition: A | Discharge status: Home / Self Care | Payer: Medicare Other | Source: Ambulatory Visit | Attending: Radiation Oncology | Admitting: Radiation Oncology

## 2016-08-27 DIAGNOSIS — Z510 Encounter for antineoplastic radiation therapy: Secondary | ICD-10-CM | POA: Diagnosis not present

## 2016-08-30 ENCOUNTER — Ambulatory Visit
Admission: RE | Admit: 2016-08-30 | Discharge: 2016-08-30 | Disposition: A | Discharge status: Home / Self Care | Payer: Medicare Other | Source: Ambulatory Visit | Attending: Radiation Oncology | Admitting: Radiation Oncology

## 2016-08-30 ENCOUNTER — Telehealth: Payer: Self-pay | Admitting: *Deleted

## 2016-08-30 ENCOUNTER — Encounter: Payer: Self-pay | Admitting: Radiation Oncology

## 2016-08-30 VITALS — BP 149/100 | HR 63 | Temp 98.0°F | Resp 20 | Wt 233.6 lb

## 2016-08-30 DIAGNOSIS — C50112 Malignant neoplasm of central portion of left female breast: Secondary | ICD-10-CM

## 2016-08-30 DIAGNOSIS — Z170 Estrogen receptor positive status [ER+]: Principal | ICD-10-CM

## 2016-08-30 DIAGNOSIS — Z510 Encounter for antineoplastic radiation therapy: Secondary | ICD-10-CM | POA: Diagnosis not present

## 2016-08-30 NOTE — Progress Notes (Addendum)
Weekly rad txs left breast 20/20 completed, left breast mild erythema, under axilla ski9n thinning, no breakdown though, using radiaplex bid, no pain stated, appetite good,  1 month f/u appt with Tanya Juarez 10/12/16 10:33 AM BP (!) 149/100 (BP Location: Right Arm, Patient Position: Sitting, Cuff Size: Normal)   Pulse 63   Temp 98 F (36.7 C) (Oral)   Resp 20   Wt 233 lb 9.6 oz (106 kg)   BMI 38.28 kg/m   Wt Readings from Last 3 Encounters:  08/30/16 233 lb 9.6 oz (106 kg)  08/23/16 232 lb (105.2 kg)  08/04/16 236 lb 6.4 oz (107.2 kg)

## 2016-08-30 NOTE — Telephone Encounter (Signed)
  Oncology Nurse Navigator Documentation  Navigator Location: [facility] (08/30/16 1200)   )Navigator Encounter Type: Telephone (08/30/16 1200) Telephone: [facility] Call (08/30/16 1200)                   Patient Visit Type: RadOnc (08/30/16 1200) Treatment Phase: Final Radiation Tx (08/30/16 1200)                            Time Spent with Patient: 15 (08/30/16 1200)

## 2016-08-30 NOTE — Progress Notes (Signed)
Department of Radiation Oncology  Phone:  [phone] Fax:        [phone]  Weekly Treatment Note    Name: Tanya Juarez Date: 08/30/2016 MRN: [id number] DOB: 1946-02-08   Diagnosis:     ICD-9-CM ICD-10-CM   1. Malignant neoplasm of central portion of left breast in female, estrogen receptor positive ([place]) 174.1 C50.112    V86.0 Z17.0      Current dose: 50 Gy  Current fraction: 20   MEDICATIONS: Current Outpatient Prescriptions  Medication Sig Dispense Refill  . aspirin 81 MG tablet Take 81 mg by mouth daily.    . Cholecalciferol (VITAMIN D3) 1000 UNITS CAPS Take 1,000 Units by mouth daily.     . cloNIDine (CATAPRES) 0.2 MG tablet Take 0.2 mg by mouth 2 (two) times daily.    . hyaluronate sodium (RADIAPLEXRX) GEL Apply 1 application topically 2 (two) times daily.    Marland Kitchen lisinopril-hydrochlorothiazide (PRINZIDE,ZESTORETIC) 20-25 MG per tablet Take 1 tablet by mouth daily.    . metFORMIN (GLUCOPHAGE-XR) 500 MG 24 hr tablet Take 1 tablet by mouth daily.    . non-metallic deodorant Jethro Poling) MISC Apply 1 application topically daily.    . pravastatin (PRAVACHOL) 20 MG tablet Take 1 tablet by mouth daily.    . vitamin B-12 (CYANOCOBALAMIN) 1000 MCG tablet Take 1,000 mcg by mouth daily.     No current facility-administered medications for this encounter.      ALLERGIES: Amlodipine   LABORATORY DATA:  Lab Results  Component Value Date   WBC 24.7 (H) 04/22/2016   HGB 10.8 (L) 04/22/2016   HCT 32.9 (L) 04/22/2016   MCV 91.9 04/22/2016   PLT 291 04/22/2016   Lab Results  Component Value Date   NA 131 (L) 04/22/2016   K 3.7 04/22/2016   CL 101 04/22/2016   CO2 24 04/22/2016   Lab Results  Component Value Date   ALT 14 03/03/2016   AST 11 03/03/2016   ALKPHOS 49 03/03/2016   BILITOT 0.58 03/03/2016     NARRATIVE: Tanya Juarez was seen today for weekly treatment management. The chart was checked and the patient's films were reviewed.  The patient has  completed her final treatment to her left breast. She denies pain. The patient reports fatigue and a good appetite. She is using radiaplex bid.  BP (!) 149/100 (BP Location: Right Arm, Patient Position: Sitting, Cuff Size: Normal)   Pulse 63   Temp 98 F (36.7 C) (Oral)   Resp 20   Wt 233 lb 9.6 oz (106 kg)   BMI 38.28 kg/m   Wt Readings from Last 3 Encounters:  08/30/16 233 lb 9.6 oz (106 kg)  08/23/16 232 lb (105.2 kg)  08/04/16 236 lb 6.4 oz (107.2 kg)    PHYSICAL EXAMINATION: weight is 233 lb 9.6 oz (106 kg). Her oral temperature is 98 F (36.7 C). Her blood pressure is 149/100 (abnormal) and her pulse is 63. Her respiration is 20.      Hyperpigmentation with mild dry desquamation under the breast.  ASSESSMENT: The patient is doing satisfactorily with treatment.  PLAN: Patient was given a one month follow up appointment with Shona Simpson on 10/12/16. Patient was advised to use Vitamin E when she finishes her Radiaplex.  ------------------------------------------------  Jodelle Gross, MD, PhD  This document serves as a record of services personally performed by Kyung Rudd, MD. It was created on his behalf by Bethann Humble, a trained medical scribe. The creation of this record is  based on the scribe's personal observations and the provider's statements to them. This document has been checked and approved by the attending provider.

## 2016-08-30 NOTE — Progress Notes (Signed)
Complex simulation note  Diagnosis: Left-sided breast cancer  Narrative The patient has initially been planned to receive a course of whole breast radiation to a dose of 42.5 Gy in 17 fractions. The patient will now receive an additional boost to the seroma cavity which has been contoured. This will correspond to a boost of 7.5 Gy at 2.5 Gy per fraction. To accomplish this, an additional 3 customized blocks have been designed for this purpose. A complex isodose plan is requested to ensure that the target area is adequately covered with radiation dose and that the nearby normal structures such as the lung are adequately spared. The patient's final total dose will be 50 Gy.  ------------------------------------------------  Tanya Aaronson S. Jarrick Fjeld, MD, PhD 

## 2016-09-02 NOTE — Progress Notes (Signed)
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 The patient developed hyperpigmentation and erythema with mild dry desquamation under the left breast. She denied pain and reported fatigue and a good appetite.  Plan: The patient has completed radiation treatment. The patient will return to radiation oncology clinic for routine followup in one month. I advised them to call or return sooner if they have any questions or concerns related to their recovery or treatment.  ------------------------------------------------  Jodelle Gross, MD, PhD  This document serves as a record of services personally performed by Kyung Rudd, MD. It was created on his behalf by Darcus Austin, a trained medical scribe. The creation of this record is based on the scribe's personal observations and the provider's statements to them. This document has been checked and approved by the attending provider.

## 2016-10-01 NOTE — Progress Notes (Signed)
Complex simulation note  Diagnosis: Left-sided breast cancer  Narrative The patient has initially been planned to receive a course of whole breast radiation to a dose of 42.5 Gy in 17 fractions. The patient will now receive an additional boost to the seroma cavity which has been contoured. This will correspond to a boost of 7.5 Gy at 2.5 Gy per fraction. To accomplish this, an additional 3 customized blocks have been designed for this purpose. A complex isodose plan is requested to ensure that the target area is adequately covered with radiation dose and that the nearby normal structures such as the lung are adequately spared. The patient's final total dose will be 50 Gy.  ------------------------------------------------  Addis Tuohy S. Amenah Tucci, MD, PhD 

## 2016-10-11 NOTE — Progress Notes (Signed)
Mrs. Tanya Juarez 71 y.o. woman with Malignant neoplasm of central portion of left breast in female, estrogen receptor positive  one month FU.  Skin status:Left breast with hyperpigmentation. What lotion are you using? Used Radiaplex gel, will start using a lotion with vitamin E. Have you seen med onc? If not, when is appointment:03-03-16 Dr. Jana Hakim next appointment 10-26-16 If they are ER+, have they started Al or Tamoxifen? If not, why? Will start 10-26-16 after she sees Dr. Tana Felts Discuss survivorship appointment. Not scheduled Have you had a mammogram scheduled? Not scheduled yet Offer referral to [facility]. Will receive at the survivorship appointment. Oncotype Dx. Score:None Appetite:Good Pain:No Fatigue:No Arm mobility:Able to move left arm without difficulty. Lymphedema:None Wt Readings from Last 3 Encounters:  08/30/16 233 lb 9.6 oz (106 kg)  08/23/16 232 lb (105.2 kg)  08/04/16 236 lb 6.4 oz (107.2 kg)  BP (!) 159/79   Pulse 76   Temp 98.1 F (36.7 C) (Oral)   Resp 20   Ht 5' 5.5" (1.664 m)   Wt 237 lb 9.6 oz (107.8 kg)   SpO2 100%   BMI 38.94 kg/m

## 2016-10-12 ENCOUNTER — Ambulatory Visit
Admission: RE | Admit: 2016-10-12 | Discharge: 2016-10-12 | Disposition: A | Discharge status: Home / Self Care | Payer: Medicare Other | Source: Ambulatory Visit | Attending: Radiation Oncology | Admitting: Radiation Oncology

## 2016-10-12 ENCOUNTER — Encounter: Payer: Self-pay | Admitting: Radiation Oncology

## 2016-10-12 VITALS — BP 159/79 | HR 76 | Temp 98.1°F | Resp 20 | Ht 65.5 in | Wt 237.6 lb

## 2016-10-12 DIAGNOSIS — D0512 Intraductal carcinoma in situ of left breast: Secondary | ICD-10-CM | POA: Diagnosis not present

## 2016-10-12 DIAGNOSIS — Z888 Allergy status to other drugs, medicaments and biological substances status: Secondary | ICD-10-CM | POA: Diagnosis not present

## 2016-10-12 DIAGNOSIS — Z923 Personal history of irradiation: Secondary | ICD-10-CM | POA: Diagnosis not present

## 2016-10-12 DIAGNOSIS — Z170 Estrogen receptor positive status [ER+]: Secondary | ICD-10-CM | POA: Insufficient documentation

## 2016-10-12 DIAGNOSIS — Z79899 Other long term (current) drug therapy: Secondary | ICD-10-CM | POA: Insufficient documentation

## 2016-10-12 DIAGNOSIS — N61 Mastitis without abscess: Secondary | ICD-10-CM | POA: Diagnosis not present

## 2016-10-12 DIAGNOSIS — C50112 Malignant neoplasm of central portion of left female breast: Secondary | ICD-10-CM

## 2016-10-12 DIAGNOSIS — Z7982 Long term (current) use of aspirin: Secondary | ICD-10-CM | POA: Diagnosis not present

## 2016-10-12 DIAGNOSIS — Z7984 Long term (current) use of oral hypoglycemic drugs: Secondary | ICD-10-CM | POA: Insufficient documentation

## 2016-10-12 NOTE — Progress Notes (Signed)
Radiation Oncology         [phone] ________________________________  Name: Tanya Juarez MRN: [id number]  Date: 10/12/2016  DOB: 19-Jan-1946  Post Treatment Note  CC: Thurman Coyer, MD  Magrinat, Virgie Dad, MD  Diagnosis:     Interval Since Last Radiation:  6 weeks   08/02/16 - 08/30/16: 1. Left breast: 42.5 Gy in 17 fractions 2. Left breast boost: 7.5 Gy in 3 fractions  Narrative:  The patient returns today for routine follow-up. During treatment she did very well with radiotherapy and did not have significant desquamation.                             On review of systems, the patient states she's had two courses of antibiotics due to infection of the right breast. She denies any concerns with her left breast, but has been using gauze to keep her right breast site covered. No fevers are noted. No other complaints are verbalized.   ALLERGIES:  is allergic to amlodipine.  Meds: Current Outpatient Prescriptions  Medication Sig Dispense Refill  . aspirin 81 MG tablet Take 81 mg by mouth daily.    . Cholecalciferol (VITAMIN D3) 1000 UNITS CAPS Take 1,000 Units by mouth daily.     . cloNIDine (CATAPRES) 0.2 MG tablet Take 0.2 mg by mouth 2 (two) times daily.    Marland Kitchen lisinopril-hydrochlorothiazide (PRINZIDE,ZESTORETIC) 20-25 MG per tablet Take 1 tablet by mouth daily.    . metFORMIN (GLUCOPHAGE-XR) 500 MG 24 hr tablet Take 1 tablet by mouth daily.    . pravastatin (PRAVACHOL) 20 MG tablet Take 1 tablet by mouth daily.    . vitamin B-12 (CYANOCOBALAMIN) 1000 MCG tablet Take 1,000 mcg by mouth daily.     No current facility-administered medications for this encounter.     Physical Findings:  height is 5' 5.5" (1.664 m) and weight is 237 lb 9.6 oz (107.8 kg). Her oral temperature is 98.1 F (36.7 C). Her blood pressure is 159/79 (abnormal) and her pulse is 76. Her respiration is 20 and oxygen saturation is 100%.  In general this is a well appearing african [place] female in no  acute distress. She's alert and oriented x4 and appropriate throughout the examination. Cardiopulmonary assessment is negative for acute distress and she exhibits normal effort. The left breast was examined and reveals well healed key hole scars and minimal hyperpigmentation of the breast. No desquamation was noted. Along the 8 o'clock position of the right breast, there is a small wound dressed with a 4x4 with minimal drainage consistent with recent punctate site. Minimal cellulitic changes are noted circumferentially at this site.   Lab Findings: Lab Results  Component Value Date   WBC 24.7 (H) 04/22/2016   HGB 10.8 (L) 04/22/2016   HCT 32.9 (L) 04/22/2016   MCV 91.9 04/22/2016   PLT 291 04/22/2016     Radiographic Findings: No results found.  Impression/Plan: 1. 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 The patient has been doing well since completion of radiotherapy. We discussed that we would be happy to continue to follow her as needed, but she will also continue to follow up with Dr. Jana Hakim in medical oncology and she will proceed with antiestrogen therapy. She was counseled on skin care as well as measures to avoid sun exposure to this area.  2. Survivorship. The patient will return to medical oncology and will discuss survivorship referral at that time.  3. Postop  mastitis. The patient has mastitis of the right breast being treated by plastic surgery. She will follow up with her plastic surgeon.     Carola Rhine, PAC

## 2016-10-26 ENCOUNTER — Telehealth: Payer: Self-pay | Admitting: Oncology

## 2016-10-26 ENCOUNTER — Ambulatory Visit: Payer: Medicare Other | Admitting: Oncology

## 2016-10-26 NOTE — Telephone Encounter (Signed)
Appointment rescheduled per patient request. °

## 2016-10-28 ENCOUNTER — Ambulatory Visit (HOSPITAL_BASED_OUTPATIENT_CLINIC_OR_DEPARTMENT_OTHER): Payer: Medicare Other | Admitting: Oncology

## 2016-10-28 VITALS — BP 183/81 | HR 89 | Temp 98.4°F | Resp 18 | Ht 65.5 in | Wt 239.0 lb

## 2016-10-28 DIAGNOSIS — Z170 Estrogen receptor positive status [ER+]: Secondary | ICD-10-CM | POA: Diagnosis not present

## 2016-10-28 DIAGNOSIS — C50112 Malignant neoplasm of central portion of left female breast: Secondary | ICD-10-CM

## 2016-10-28 DIAGNOSIS — C50812 Malignant neoplasm of overlapping sites of left female breast: Secondary | ICD-10-CM

## 2016-10-28 MED ORDER — ANASTROZOLE 1 MG PO TABS: 1.0000 mg | ORAL_TABLET | Freq: Every day | ORAL | 4 refills | Status: DC

## 2016-10-28 NOTE — Progress Notes (Signed)
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 This foundnew calcifications in the left breastcentral to the nipple On 02/13/2016 the patient had unilateral left diagnostic mammography, which found the breast composition to be category be.In the left breast central to the nipple there were grouped heterogeneous calcifications measuring 2.5 cm.   Biopsy of this area 02/18/2016 showed ([id number])ductal carcinoma in situ, grade 1, with a very small area of microinvasive breast cancer, also grade 1.The ductal carcinoma in situ was estrogen and progesterone receptor positive, both at 90%, both with strong staining intensity.The invasive tumor or was HER-2 not amplified, with a signals ratio 1.31and the number per cell 3.15. There was not sufficient tissue for MIB-1  On 02/27/2016 the patient underwent left axillary ultrasound, which was normal.  Her subsequent history is as detailed below.  INTERVAL HISTORY: [name] returns today for follow-up of her very early stage breast cancer. Since her last visit here she had a left lumpectomy and sentinel lymph node sampling, on 04/22/2016. This showed a 0.1 cm residual invasive ductal carcinoma, grade 1, associated with extensive ductal carcinoma in situ. 3 sentinel lymph nodes were all  clear. Margins were clear.  She also had bilateral breast reduction. This was complicated by infection in the right breast. That has largely resolved. She is very pleased with the cosmetic result and tells me because of the decreased weight on her chest she is now able to ambulate much better.  She then proceeded to adjuvant radiation completed in December 2017  She is now here to discuss anti-estrogens.   REVIEW OF SYSTEMS: A detailed review of systems today was entirely stable  PAST MEDICAL HISTORY: Past Medical History:  Diagnosis Date  . Cancer of left breast ([place])   . Diabetic peripheral neuropathy ([place])   . Full dentures   . Hyperlipidemia   . Hypertension   . Neuropathy ([place])   . Type II diabetes mellitus ([place])   . Weakness of both legs     PAST SURGICAL HISTORY: Past Surgical History:  Procedure Laterality Date  . APPENDECTOMY  1971  . BREAST BIOPSY Left 01/2016  . BREAST LUMPECTOMY WITH RADIOACTIVE SEED AND SENTINEL LYMPH NODE BIOPSY Left 04/22/2016   Procedure: LEFT BREAST LUMPECTOMY WITH RADIOACTIVE SEED AND SENTINEL LYMPH NODE BIOPSY;  Surgeon: Alphonsa Overall, MD;  Location: [facility];  Service: General;  Laterality: Left;  . BREAST REDUCTION SURGERY Bilateral 04/22/2016   Procedure: BILATERAL IMMEDIATE MAMMARY REDUCTION  (BREAST);  Surgeon: Loel Lofty Dillingham, DO;  Location: [facility];  Service: Plastics;  Laterality: Bilateral;  . CESAREAN SECTION     1971  . CESAREAN SECTION WITH BILATERAL TUBAL LIGATION  1973  . FRACTURE SURGERY    . I&D EXTREMITY Left 08/19/2015   Procedure: IRRIGATION AND DEBRIDEMENT LEFT ANKLE FRACTURE;  Surgeon: Leandrew Koyanagi, MD;  Location: [facility];  Service: Orthopedics;  Laterality: Left;  Marland Kitchen MASTECTOMY COMPLETE /  SIMPLE W/ SENTINEL NODE BIOPSY Left 04/22/2016   WITH RADIOACTIVE SEED   . ORIF ANKLE FRACTURE Left 08/20/2015   Procedure: OPEN REDUCTION INTERNAL FIXATION (ORIF) LEFT ANKLE FRACTURE, SYNDESMOSIS INJURY;  Surgeon: Leandrew Koyanagi, MD;   Location: [facility];  Service: Orthopedics;  Laterality: Left;  . REDUCTION MAMMAPLASTY Bilateral 04/22/2016    FAMILY HISTORY Family History  Problem Relation Age of Onset  . Liver cancer Mother   . Lung cancer Father   . Hypertension    the patient's father died at the age of 23 possibly from cancer. The patient tells me he was of)" they didn't tell him what he had". The patient's mother died from pancreatic cancer metastatic to the [place] age 78. The patient had 4 brothers and 4 sisters. One sister was diagnosed with breast cancer at age 46. This metastasizedand eventually took her life. There is no other history of breast cancer in the family and no history of ovarian cancer.  GYNECOLOGIC HISTORY:  No LMP recorded. Patient is postmenopausal. Menarche age 62, first live birthage 75, the patient is GX P2. She stopped having periods in her early 45s. She did not take hormone replacement. She never used oral contraception.  SOCIAL HISTORY:  The patient used to work at [employer]. She is now retired. She is a widow. She lives by herself, with no pets. Her sons are A, who lives in [place] and works as a Administrator, and Lennette Bihari, lives in [place], and works as a Consulting civil engineer. The patient has 6 grandchildren. She is a Tourist information centre manager.    ADVANCED DIRECTIVES: not in place   HEALTH MAINTENANCE: Social History  Substance Use Topics  . Smoking status: Never Smoker  . Smokeless tobacco: Never Used  . Alcohol use 1.2 oz/week    2 Glasses of wine per week     Comment: occasionally     Colonoscopy: never  PAP:  Bone density:[facility]05/08/2016, with a lowest T score of -1.2  Lipid panel:  Allergies  Allergen Reactions  . Amlodipine Swelling    LE edema    Current Outpatient Prescriptions  Medication Sig Dispense Refill  . aspirin 81 MG tablet Take 81 mg by mouth daily.    . Cholecalciferol (VITAMIN D3) 1000 UNITS CAPS Take 1,000 Units by mouth daily.     . cloNIDine  (CATAPRES) 0.2 MG tablet Take 0.2 mg by mouth 2 (two) times daily.    Marland Kitchen lisinopril-hydrochlorothiazide (PRINZIDE,ZESTORETIC) 20-25 MG per tablet Take 1 tablet by mouth daily.    . metFORMIN (GLUCOPHAGE-XR) 500 MG 24 hr tablet Take 1 tablet by mouth daily.    . pravastatin (PRAVACHOL) 20 MG tablet Take 1 tablet by mouth daily.    . vitamin B-12 (CYANOCOBALAMIN) 1000 MCG tablet Take 1,000 mcg by mouth daily.     No current facility-administered medications for this visit.     OBJECTIVE: middle-aged African-American woman Using a walker Vitals:   10/28/16 1415  BP: (!) 183/81  Pulse: 89  Resp: 18  Temp: 98.4 F (36.9 C)     Body mass index is 39.17 kg/m.    ECOG FS:1 - Symptomatic but completely ambulatory  Sclerae unicteric, EOMs intact Oropharynx clear and moist No cervical or supraclavicular adenopathy Lungs no rales or rhonchi Heart regular rate and rhythm Abd soft, nontender, positive bowel sounds MSK no focal spinal tenderness, no upper extremity lymphedema Neuro: nonfocal, well oriented, appropriate affect Breasts: Both breasts are status post reduction mammoplasty. The cosmetic result is good  and there is good symmetry. On the right laterally there is a slightly erythematous area which is when the patient had her infection. That appears to be healing well. In the left breast there is no evidence of local recurrence. Both axillae are benign.  LAB RESULTS:  CMP     Component Value Date/Time   NA 131 (L) 04/22/2016 1516   NA 130 (L) 03/03/2016 0820   K 3.7 04/22/2016 1516   K 4.7 03/03/2016 0820   CL 101 04/22/2016 1516   CO2 24 04/22/2016 1516   CO2 24 03/03/2016 0820   GLUCOSE 145 (H) 04/22/2016 1516   GLUCOSE 127 03/03/2016 0820   BUN 10 04/22/2016 1516   BUN 13.5 03/03/2016 0820   CREATININE 0.82 04/22/2016 1516   CREATININE 1.0 03/03/2016 0820   CALCIUM 8.5 (L) 04/22/2016 1516   CALCIUM 9.5 03/03/2016 0820   PROT 7.8 03/03/2016 0820   ALBUMIN 3.2 (L)  03/03/2016 0820   AST 11 03/03/2016 0820   ALT 14 03/03/2016 0820   ALKPHOS 49 03/03/2016 0820   BILITOT 0.58 03/03/2016 0820   GFRNONAA >60 04/22/2016 1516   GFRAA >60 04/22/2016 1516    INo results found for: SPEP, UPEP  Lab Results  Component Value Date   WBC 24.7 (H) 04/22/2016   NEUTROABS 7.9 (H) 03/03/2016   HGB 10.8 (L) 04/22/2016   HCT 32.9 (L) 04/22/2016   MCV 91.9 04/22/2016   PLT 291 04/22/2016      Chemistry      Component Value Date/Time   NA 131 (L) 04/22/2016 1516   NA 130 (L) 03/03/2016 0820   K 3.7 04/22/2016 1516   K 4.7 03/03/2016 0820   CL 101 04/22/2016 1516   CO2 24 04/22/2016 1516   CO2 24 03/03/2016 0820   BUN 10 04/22/2016 1516   BUN 13.5 03/03/2016 0820   CREATININE 0.82 04/22/2016 1516   CREATININE 1.0 03/03/2016 0820   GLU 131 08/27/2015      Component Value Date/Time   CALCIUM 8.5 (L) 04/22/2016 1516   CALCIUM 9.5 03/03/2016 0820   ALKPHOS 49 03/03/2016 0820   AST 11 03/03/2016 0820   ALT 14 03/03/2016 0820   BILITOT 0.58 03/03/2016 0820       No results found for: LABCA2  No components found for: LABCA125  No results for input(s): INR in the last 168 hours.  Urinalysis No results found for: COLORURINE, APPEARANCEUR, LABSPEC, PHURINE, GLUCOSEU, HGBUR, BILIRUBINUR, KETONESUR, PROTEINUR, UROBILINOGEN, NITRITE, LEUKOCYTESUR    ELIGIBLE FOR AVAILABLE RESEARCH PROTOCOL: no  STUDIES: No results found.   ASSESSMENT: 71 y.o.  [place] woman status post left breast overlapping sites biopsy 02/18/2016 for an invasive ductal carcinoma, grade 1, estrogen and progesterone receptor strongly positive, HER-2 not amplified. (This has been majority of the tumor sample was in situ)  (1) status post left lumpectomy and sentinel lymph node sampling 04/22/2016 for a pT1a pN0 invasive ductal carcinoma, grade 1, with negative margins.   (2) adjuvant radiation 08/02/16 - 08/30/16 Site/dose:    1) Left breast: 42.5 Gy in 17 fractions 2) Left  breast boost: 7.5 Gy in 3 fractions  (3) the patient does not meet criteria for genetics testing  (4) anastrozole started 10/28/2016  PLAN Etrulia has finally completed her local treatment and is now ready to consider anti-estrogens. She understands her risk of recurrence is low enough that these are not mandatory. Nevertheless she would like to do what she can to reduce her risk of a  new breast cancer developing so long as she can tolerate the medication well.  We discussed the difference between tamoxifen and anastrozole in detail. She understands that anastrozole and the aromatase inhibitors in general work by blocking estrogen production. Accordingly vaginal dryness, decrease in bone density, and of course hot flashes can result. The aromatase inhibitors can also negatively affect the cholesterol profile, although that is a minor effect. One out of 5 women on aromatase inhibitors we will feel "old and achy". This arthralgia/myalgia syndrome, which resembles fibromyalgia clinically, does resolve with stopping the medications. Accordingly this is not a reason to not try an aromatase inhibitor but it is a frequent reason to stop it (in other words 20% of women will not be able to tolerate these medications).  Tamoxifen on the other hand does not block estrogen production. It does not "take away a woman's estrogen". It blocks the estrogen receptor in breast cells. Like anastrozole, it can also cause hot flashes. As opposed to anastrozole, tamoxifen has many estrogen-like effects. It is technically an estrogen receptor modulator. This means that in some tissues tamoxifen works like estrogen-- for example it helps strengthen the bones. It tends to improve the cholesterol profile. It can cause thickening of the endometrial lining, and even endometrial polyps or rarely cancer of the uterus.(The risk of uterine cancer due to tamoxifen is one additional cancer per thousand women year). It can cause vaginal  wetness or stickiness. It can cause blood clots through this estrogen-like effect--the risk of blood clots with tamoxifen is exactly the same as with birth control pills or hormone replacement.  Neither of these agents causes mood changes or weight gain, despite the popular belief that they can have these side effects. We have data from studies comparing either of these drugs with placebo, and in those cases the control group had the same amount of weight gain and depression as the group that took the drug.  After all this discussion it seemed clear to both of those that anastrozole is a better choice for her and I have placed the prescription. She is going to see me again in mid June, after her mammography this year, but she knows to call for any problems that may develop before that visit.  If she tolerates anastrozole well the plan will be to continue that for 5 years    :Chauncey Cruel, MD   10/28/2016 2:25 PM Medical Oncology and Hematology [facility] [place], [place] [address] Tel. [phone]    Fax. [phone]

## 2016-11-15 IMAGING — RF DG ANKLE COMPLETE 3+V*L*
1 series · 4 of 4 positions shown · non-contrast
Comparison: None.

CLINICAL DATA: ORIF LEFT ankle

EXAM:
DG C-ARM 61-120 MIN; LEFT ANKLE COMPLETE - 3+ VIEW

[Series 1: run · 4 of 4 slices shown]
[im 1/4]
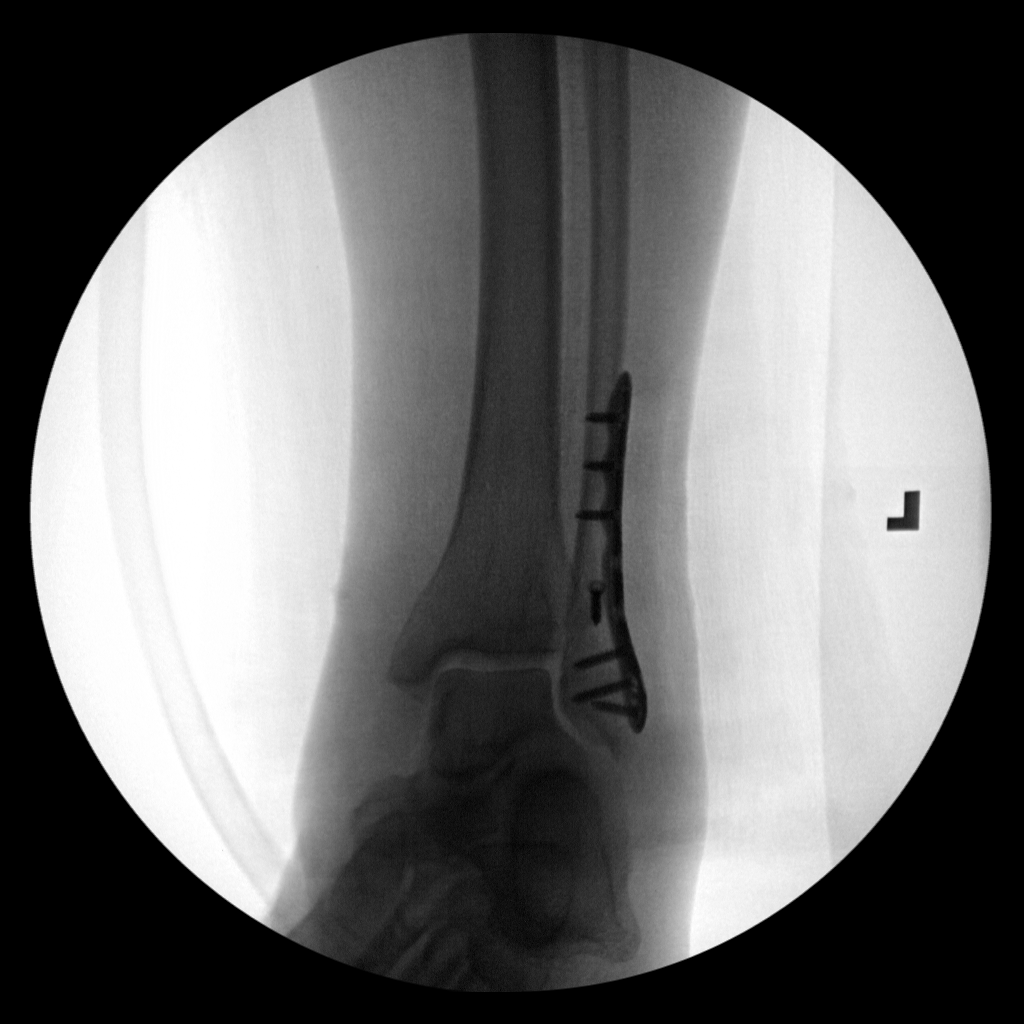
[im 2/4]
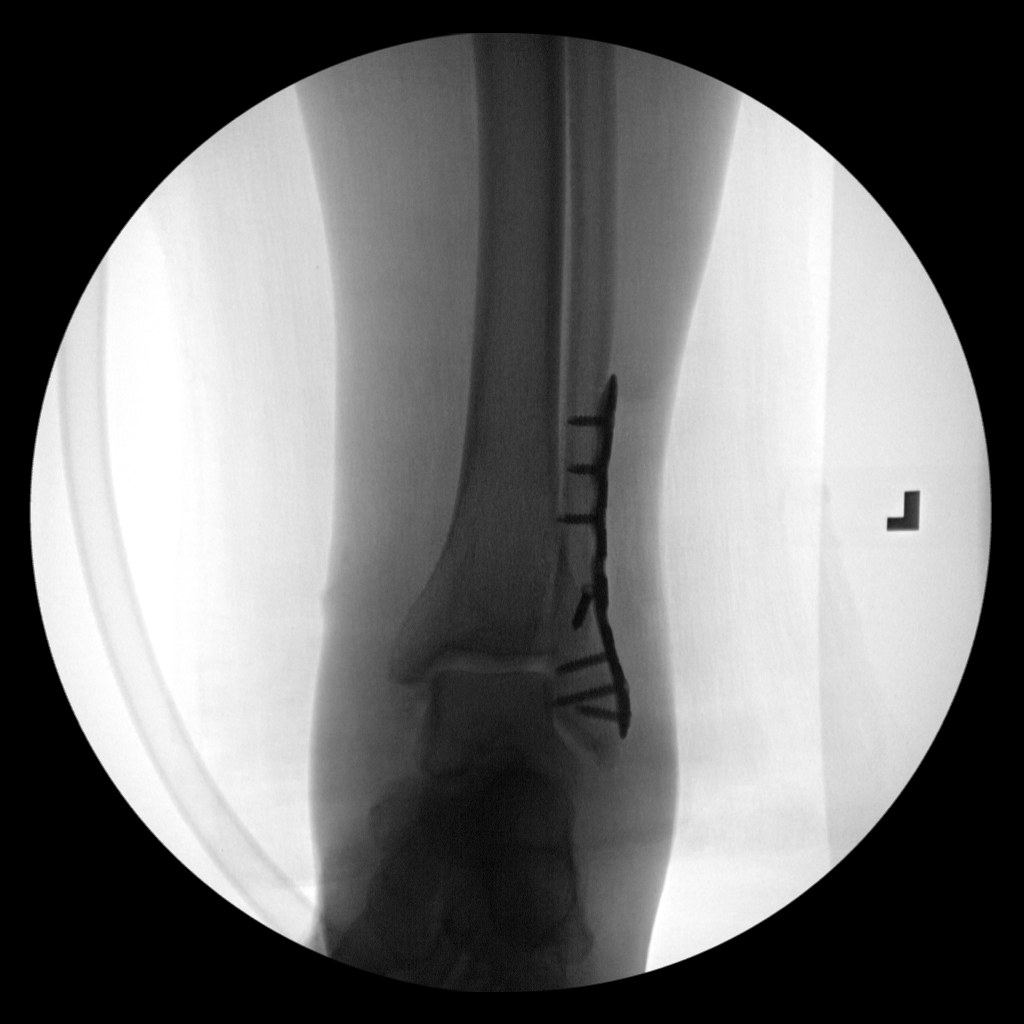
[im 3/4]
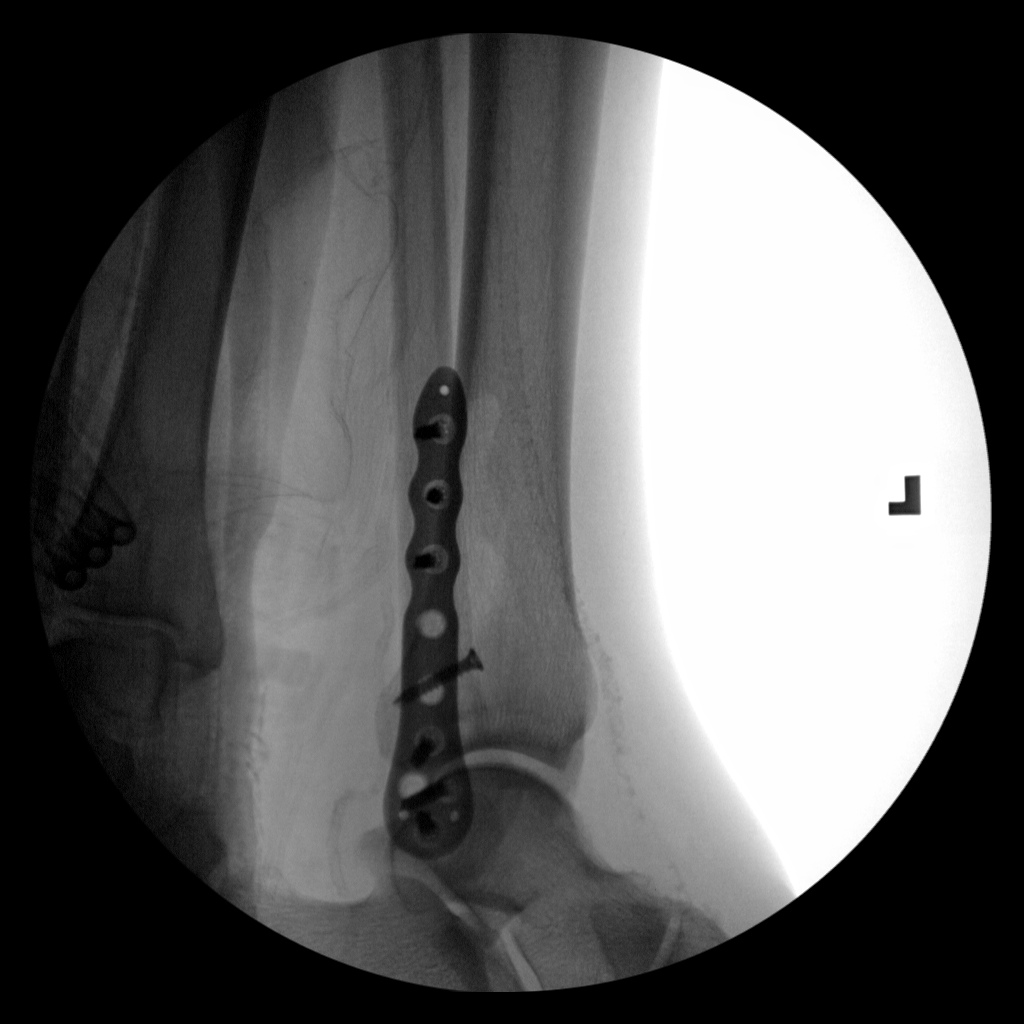
[im 4/4]
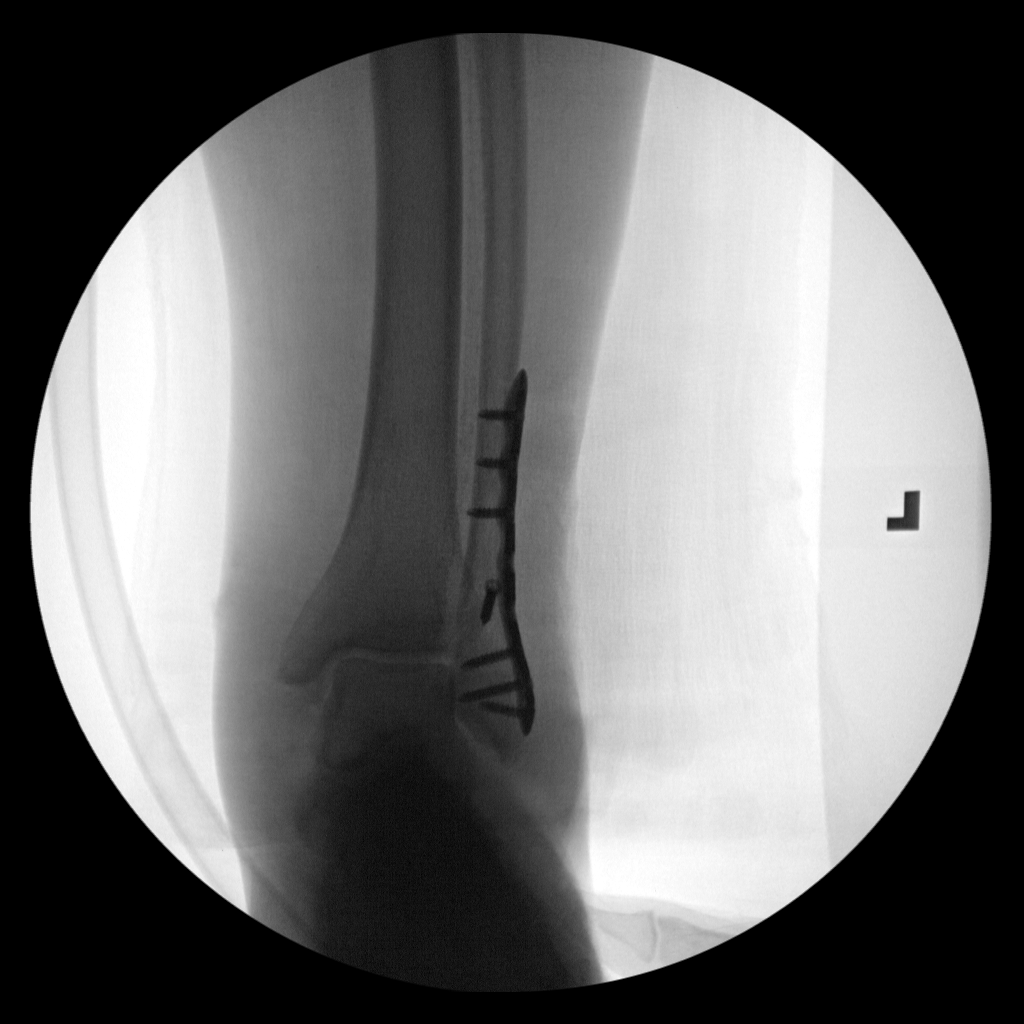

[4 of 4 positions shown; findings below may reference images not displayed]

FINDINGS: Dynamic plate fixation of the distal fibula.  Ankle mortise intact.
IMPRESSION: ORIF distal fibular fracture

## 2017-01-24 ENCOUNTER — Ambulatory Visit (HOSPITAL_BASED_OUTPATIENT_CLINIC_OR_DEPARTMENT_OTHER): Payer: Medicare Other | Admitting: Adult Health

## 2017-01-24 ENCOUNTER — Encounter: Payer: Self-pay | Admitting: Adult Health

## 2017-01-24 VITALS — BP 180/84 | HR 76 | Temp 97.8°F | Resp 18 | Ht 65.5 in | Wt 236.6 lb

## 2017-01-24 DIAGNOSIS — C50112 Malignant neoplasm of central portion of left female breast: Secondary | ICD-10-CM

## 2017-01-24 DIAGNOSIS — Z170 Estrogen receptor positive status [ER+]: Secondary | ICD-10-CM

## 2017-01-24 DIAGNOSIS — M810 Age-related osteoporosis without current pathological fracture: Secondary | ICD-10-CM | POA: Diagnosis not present

## 2017-01-24 NOTE — Progress Notes (Signed)
CLINIC:  Survivorship   REASON FOR VISIT:  Routine follow-up post-treatment for a recent history of breast cancer.  BRIEF ONCOLOGIC HISTORY:    Cancer of central portion of left female breast ([place])   02/18/2016 Initial Biopsy    Left breast core biopsy: IDC, DCIS, grade 1, ER+(90%), PR+(90%), HER-2 negative (ratio 1.31), insufficient tissue for ki-67, vast majority of specimen DCIS.      02/25/2016 Initial Diagnosis    Cancer of central portion of left female breast ([place])     04/22/2016 Surgery    Left breast lumpectomy Lucia Gaskins): IDC 0.1cm, with DCIS 1.5cm, second nodule of DCIS, 1.8cm, margins negative, 3 SLN negative for metastases. ER+, PR+, HER-2 negative, pT45mc, pN0      08/02/2016 - 08/30/2016 Radiation Therapy    Adjuvant radiation ([facility]: 1) Left breast: 42.5 Gy in 17 fractions.  2) Left breast boost: 7.5 Gy in 3 fractions.      10/28/2016 -  Anti-estrogen oral therapy    ([place]) Anastrozole 142mdaily       Cancer of overlapping sites of left breast ([facility])   02/18/2016 Pathology Results    Breast, left, needle core biopsy - INVASIVE DUCTAL CARCINOMA. - DUCTAL CARCINOMA IN SITU WITH CALCIFICATIONS      02/18/2016 Receptors her2    Estrogen Receptor: 90%, POSITIVE, STRONG STAINING INTENSITY Progesterone Receptor: 90%, POSITIVE, STRONG STAINING INTENSITY, HER2 - NEGATIVE      03/03/2016 Initial Diagnosis    [facility]      INTERVAL HISTORY:  [facility] to review her survivorship care plan detailing her treatment course for breast cancer, as well as monitoring long-term side effects of that treatment, education regarding health maintenance, screening, and overall wellness and health promotion.   Tanya Juarez doing very well today.  She was last seen by Dr. MaJana Hakimn February, 2018.  She has decided not to take the anastrozole prescribed due to potential  side effects.  She continues to recover well after surgery and radiation.      REVIEW OF SYSTEMS:  Review of Systems  Constitutional: Negative for fever and malaise/fatigue.  HENT: Negative for hearing loss and tinnitus.   Eyes: Negative for blurred vision and double vision.  Respiratory: Negative for cough and shortness of breath.   Cardiovascular: Negative for chest pain, palpitations and leg swelling.  Gastrointestinal: Negative for abdominal pain, blood in stool, constipation, diarrhea, heartburn, melena, nausea and vomiting.  Skin: Negative for rash.  Neurological: Negative for dizziness, focal weakness and headaches.  Endo/Heme/Allergies: Does not bruise/bleed easily.  Psychiatric/Behavioral: Negative for depression. The patient is not nervous/anxious.   Breast: Denies any new nodularity, masses, tenderness, nipple changes, or nipple discharge.    ONCOLOGY TREATMENT TEAM:  1. Surgeon:  Dr. NeLucia Gaskinst [facility] 2. Medical Oncologist: Dr. MaJana Hakim. Radiation Oncologist: Dr. MoLisbeth Renshaw. Plastic Surgeon: Dr. DiMarla Roe  PAST MEDICAL/SURGICAL HISTORY:  Past Medical History:  Diagnosis Date  . Cancer of left breast ([facility]  . Diabetic peripheral neuropathy ([facility]  . Full dentures   . Hyperlipidemia   . Hypertension   . Neuropathy ([facility]  . Type II diabetes mellitus ([facility]  . Weakness of both legs    Past Surgical History:  Procedure Laterality Date  . APPENDECTOMY  1971  . BREAST BIOPSY Left 01/2016  . BREAST LUMPECTOMY WITH RADIOACTIVE SEED AND SENTINEL LYMPH  NODE BIOPSY Left 04/22/2016   Procedure: LEFT BREAST LUMPECTOMY WITH RADIOACTIVE SEED AND SENTINEL LYMPH NODE BIOPSY;  Surgeon: Alphonsa Overall, MD;  Location: [place];  Service: General;  Laterality: Left;  . BREAST REDUCTION SURGERY Bilateral 04/22/2016   Procedure: BILATERAL IMMEDIATE MAMMARY REDUCTION  (BREAST);  Surgeon: Loel Lofty Dillingham, DO;  Location: [place];  Service: Plastics;  Laterality:  Bilateral;  . CESAREAN SECTION     1971  . CESAREAN SECTION WITH BILATERAL TUBAL LIGATION  1973  . FRACTURE SURGERY    . I&D EXTREMITY Left 08/19/2015   Procedure: IRRIGATION AND DEBRIDEMENT LEFT ANKLE FRACTURE;  Surgeon: Leandrew Koyanagi, MD;  Location: [place];  Service: Orthopedics;  Laterality: Left;  Marland Kitchen MASTECTOMY COMPLETE / SIMPLE W/ SENTINEL NODE BIOPSY Left 04/22/2016   WITH RADIOACTIVE SEED   . ORIF ANKLE FRACTURE Left 08/20/2015   Procedure: OPEN REDUCTION INTERNAL FIXATION (ORIF) LEFT ANKLE FRACTURE, SYNDESMOSIS INJURY;  Surgeon: Leandrew Koyanagi, MD;  Location: [place];  Service: Orthopedics;  Laterality: Left;  . REDUCTION MAMMAPLASTY Bilateral 04/22/2016     ALLERGIES:  Allergies  Allergen Reactions  . Amlodipine Swelling    LE edema     CURRENT MEDICATIONS:  Outpatient Encounter Prescriptions as of 01/24/2017  Medication Sig  . aspirin 81 MG tablet Take 81 mg by mouth daily.  . Cholecalciferol (VITAMIN D3) 1000 UNITS CAPS Take 1,000 Units by mouth daily.   . cloNIDine (CATAPRES) 0.2 MG tablet Take 0.2 mg by mouth 2 (two) times daily.  Marland Kitchen lisinopril-hydrochlorothiazide (PRINZIDE,ZESTORETIC) 20-25 MG per tablet Take 1 tablet by mouth daily.  . metFORMIN (GLUCOPHAGE-XR) 500 MG 24 hr tablet Take 1 tablet by mouth daily.  . pravastatin (PRAVACHOL) 20 MG tablet Take 1 tablet by mouth daily.  Marland Kitchen terazosin (HYTRIN) 2 MG capsule Take 2 mg by mouth at bedtime.  . vitamin B-12 (CYANOCOBALAMIN) 1000 MCG tablet Take 1,000 mcg by mouth daily.  Marland Kitchen anastrozole (ARIMIDEX) 1 MG tablet Take 1 tablet (1 mg total) by mouth daily. (Patient not taking: Reported on 01/24/2017)   No facility-administered encounter medications on file as of 01/24/2017.      ONCOLOGIC FAMILY HISTORY:  Family History  Problem Relation Age of Onset  . Liver cancer Mother   . Lung cancer Father   . Hypertension       GENETIC COUNSELING/TESTING: none  SOCIAL HISTORY:  Tanya Juarez is widowed and lives in  [place], [place].  She has 2 children and they live in [place] and [place].  Ms. Esteve is currently retired.  She denies any current or history of tobacco, alcohol, or illicit drug use.     PHYSICAL EXAMINATION:  Vital Signs:   Vitals:   01/24/17 0925  BP: (!) 180/84  Pulse: 76  Resp: 18  Temp: 97.8 F (36.6 C)   Filed Weights   01/24/17 0925  Weight: 236 lb 9.6 oz (107.3 kg)   General: Well-nourished, well-appearing female in no acute distress.  She is unaccompanied today.   HEENT: Head is normocephalic.  Pupils equal and reactive to light. Conjunctivae clear without exudate.  Sclerae anicteric. Oral mucosa is pink, moist.  Oropharynx is pink without lesions or erythema.  Lymph: No cervical, supraclavicular, infraclavicular, or axillary lymphadenopathy noted on palpation.  Breasts: bilateral breasts s/p mammoplasty, and some scar tissue present.  No nodularity, masses present, slight thickening in left breast noted.  Otherwise benign bilateral breast exam.   Cardiovascular: Regular rate and rhythm.Marland Kitchen Respiratory: Clear to auscultation bilaterally. Chest  expansion symmetric; breathing non-labored.  GI: Abdomen soft and round; non-tender, non-distended. Bowel sounds normoactive.  GU: Deferred.  Neuro: No focal deficits. Steady gait.  Psych: Mood and affect normal and appropriate for situation.  Extremities: No edema. Skin: Warm and dry.  LABORATORY DATA:  None for this visit.  DIAGNOSTIC IMAGING:       ASSESSMENT AND PLAN:  Ms.. Bye is a pleasant 71 y.o. female with Stage IA left breast invasive ductal carcinoma, ER+/PR+/HER2-, diagnosed in 01/2016, treated with lumpectomy, adjuvant radiation therapy, and anti-estrogen therapy with Anastrozole beginning in February, 2018.  She presents to the Survivorship Clinic for our initial meeting and routine follow-up post-completion of treatment for breast cancer.    1. Stage IA left breast cancer:  Ms. Pollet is  continuing to recover from definitive treatment for breast cancer. She will follow-up with her medical oncologist, Dr. Jana Hakim in June, 2018 with history and physical exam per surveillance protocol.  She has decided to not take Anastrozole.  Today, a comprehensive survivorship care plan and treatment summary was reviewed with the patient today detailing her breast cancer diagnosis, treatment course, potential late/long-term effects of treatment, appropriate follow-up care with recommendations for the future, and patient education resources.  A copy of this summary, along with a letter will be sent to the patient's primary care provider via mail/fax/In Basket message after today's visit.    2. Bone health:  Given Ms. Davern's age/history of breast cancer and personal history of osteoporosis.  Her last DEXA scan was in 2017.  She was encouraged to increase her consumption of foods rich in calcium, as well as increase her weight-bearing activities.  She was given education on specific activities to promote bone health.  3. Cancer screening:  Due to Ms. Hogge's history and her age, she should receive screening for skin cancers, colon cancer, and gynecologic cancers.  The information and recommendations are listed on the patient's comprehensive care plan/treatment summary and were reviewed in detail with the patient.    4. Health maintenance and wellness promotion: Ms. Wachter was encouraged to consume 5-7 servings of fruits and vegetables per day. We reviewed the "Nutrition Rainbow" handout, as well as the handout "Take Control of Your Health and Reduce Your Cancer Risk" from the [facility].  She was also encouraged to engage in moderate to vigorous exercise for 30 minutes per day most days of the week. We discussed the [employer] fitness program, which is designed for cancer survivors to help them become more physically fit after cancer treatments.  She was instructed to limit her alcohol  consumption and continue to abstain from tobacco use.     5. Support services/counseling: It is not uncommon for this period of the patient's cancer care trajectory to be one of many emotions and stressors.  We discussed an opportunity for her to participate in the next session of [facility] ("Finding Your New Normal") support group series designed for patients after they have completed treatment.   Ms. Bonnell was encouraged to take advantage of our many other support services programs, support groups, and/or counseling in coping with her new life as a cancer survivor after completing anti-cancer treatment.  She was offered support today through active listening and expressive supportive counseling.  She was given information regarding our available services and encouraged to contact me with any questions or for help enrolling in any of our support group/programs.    Dispo:   -Return to cancer center July or August, 2018 for  follow up with Dr. Jana Hakim -She is welcome to return back to the Survivorship Clinic at any time; no additional follow-up needed at this time.  -Consider referral back to survivorship as a long-term survivor for continued surveillance  A total of (30) minutes of face-to-face time was spent with this patient with greater than 50% of that time in counseling and care-coordination.   Gardenia Phlegm, [place] [phone]   Note: PRIMARY CARE PROVIDER Thurman Coyer, [place] [phone]

## 2017-03-10 ENCOUNTER — Ambulatory Visit: Payer: Medicare Other | Admitting: Oncology

## 2017-04-11 ENCOUNTER — Telehealth: Payer: Self-pay | Admitting: Oncology

## 2017-04-11 NOTE — Telephone Encounter (Signed)
lvm to inform pt of r/s 8/9 appt to 9/5 at 1315 per sch msg

## 2017-05-05 ENCOUNTER — Ambulatory Visit: Payer: Medicare Other | Admitting: Oncology

## 2017-06-01 ENCOUNTER — Ambulatory Visit (HOSPITAL_BASED_OUTPATIENT_CLINIC_OR_DEPARTMENT_OTHER): Payer: Medicare Other | Admitting: Oncology

## 2017-06-01 VITALS — BP 177/62 | HR 78 | Temp 97.6°F | Resp 18 | Ht 65.5 in | Wt 240.5 lb

## 2017-06-01 DIAGNOSIS — Z170 Estrogen receptor positive status [ER+]: Secondary | ICD-10-CM | POA: Diagnosis not present

## 2017-06-01 DIAGNOSIS — Z79811 Long term (current) use of aromatase inhibitors: Secondary | ICD-10-CM

## 2017-06-01 DIAGNOSIS — C50112 Malignant neoplasm of central portion of left female breast: Secondary | ICD-10-CM | POA: Diagnosis not present

## 2017-06-01 DIAGNOSIS — Z923 Personal history of irradiation: Secondary | ICD-10-CM

## 2017-06-01 NOTE — Progress Notes (Signed)
[place]  Telephone:([phone]) [phone] Fax:(336) [phone]     ID: Tanya Juarez DOB: 06-22-1946  MR#: [id number]  QQV#:[id number]  Patient Care Team: Thurman Coyer, MD as PCP - General (Internal Medicine) Alphonsa Overall, MD as Consulting Physician (General Surgery) Syncere Kaminski, Virgie Dad, MD as Consulting Physician (Oncology) Kyung Rudd, MD as Consulting Physician (Radiation Oncology) Dillingham, Loel Lofty, DO as Consulting Physician (Plastic Surgery) Delice Bison, Charlestine Massed, NP as Nurse Practitioner (Hematology and Oncology) PCP: Thea Silversmith Dianna Rossetti, MD GYN: OTHER MD:  CHIEF COMPLAINT: microinvasive ductal carcinoma of the left breast  CURRENT TREATMENT: [Anastrozole]  BREAST CANCER HISTORY: From the original intake note:  Tanya Juarez had routine screening mammography at [facility] 02/06/2016. This foundnew calcifications in the left breastcentral to the nipple On 02/13/2016 the patient had unilateral left diagnostic mammography, which found the breast composition to be category be.In the left breast central to the nipple there were grouped heterogeneous calcifications measuring 2.5 cm.   Biopsy of this area 02/18/2016 showed ([id number])ductal carcinoma in situ, grade 1, with a very small area of microinvasive breast cancer, also grade 1.The ductal carcinoma in situ was estrogen and progesterone receptor positive, both at 90%, both with strong staining intensity.The invasive tumor or was HER-2 not amplified, with a signals ratio 1.31and the number per cell 3.15. There was not sufficient tissue for MIB-1  On 02/27/2016 the patient underwent left axillary ultrasound, which was normal.  Her subsequent history is as detailed below.  INTERVAL HISTORY: Tanya Juarez returns today for follow-up of her estrogen receptor positive breast cancer. She is doing "excellent". She is getting around better and better. She doesn't have any explanation for this except that "the Reita Cliche is helping".  She  supposedly started anastrozole February 2018. She did by the drug but never did start it. She says she read up on the side effects and she didn't want any more problems and she already has.  REVIEW OF SYSTEMS: She had a study for claudication which she says was negative. It did make her peripheral neuropathy worse she says. She discovered that her bras did not fit after the breast reduction and she has gotten herself some new ones which fit better. Aside from these issues a detailed review of systems today was stable  PAST MEDICAL HISTORY: Past Medical History:  Diagnosis Date  . Cancer of left breast ([place])   . Diabetic peripheral neuropathy ([place])   . Full dentures   . Hyperlipidemia   . Hypertension   . Neuropathy   . Type II diabetes mellitus ([place])   . Weakness of both legs     PAST SURGICAL HISTORY: Past Surgical History:  Procedure Laterality Date  . APPENDECTOMY  1971  . BREAST BIOPSY Left 01/2016  . BREAST LUMPECTOMY WITH RADIOACTIVE SEED AND SENTINEL LYMPH NODE BIOPSY Left 04/22/2016   Procedure: LEFT BREAST LUMPECTOMY WITH RADIOACTIVE SEED AND SENTINEL LYMPH NODE BIOPSY;  Surgeon: Alphonsa Overall, MD;  Location: [place];  Service: General;  Laterality: Left;  . BREAST REDUCTION SURGERY Bilateral 04/22/2016   Procedure: BILATERAL IMMEDIATE MAMMARY REDUCTION  (BREAST);  Surgeon: Loel Lofty Dillingham, DO;  Location: [place];  Service: Plastics;  Laterality: Bilateral;  . CESAREAN SECTION     1971  . CESAREAN SECTION WITH BILATERAL TUBAL LIGATION  1973  . FRACTURE SURGERY    . I&D EXTREMITY Left 08/19/2015   Procedure: IRRIGATION AND DEBRIDEMENT LEFT ANKLE FRACTURE;  Surgeon: Leandrew Koyanagi, MD;  Location: [place];  Service: Orthopedics;  Laterality: Left;  .  MASTECTOMY COMPLETE / SIMPLE W/ SENTINEL NODE BIOPSY Left 04/22/2016   WITH RADIOACTIVE SEED   . ORIF ANKLE FRACTURE Left 08/20/2015   Procedure: OPEN REDUCTION INTERNAL FIXATION (ORIF) LEFT ANKLE FRACTURE, SYNDESMOSIS INJURY;   Surgeon: Leandrew Koyanagi, MD;  Location: [place];  Service: Orthopedics;  Laterality: Left;  . REDUCTION MAMMAPLASTY Bilateral 04/22/2016    FAMILY HISTORY Family History  Problem Relation Age of Onset  . Liver cancer Mother   . Lung cancer Father   . Hypertension Unknown   the patient's father died at the age of 75 possibly from cancer. The patient tells me he was of)" they didn't tell him what he had". The patient's mother died from pancreatic cancer metastatic to the [place] age 65. The patient had 4 brothers and 4 sisters. One sister was diagnosed with breast cancer at age 38. This metastasizedand eventually took her life. There is no other history of breast cancer in the family and no history of ovarian cancer.  GYNECOLOGIC HISTORY:  No LMP recorded. Patient is postmenopausal. Menarche age 31, first live birthage 89, the patient is GX P2. She stopped having periods in her early 18s. She did not take hormone replacement. She never used oral contraception.  SOCIAL HISTORY:  The patient used to work at [employer]. She is now retired. She is a widow. She lives by herself, with no pets. Her sons are A, who lives in [place] and works as a Administrator, and Lennette Bihari, lives in [place], and works as a Consulting civil engineer. The patient has 6 grandchildren. She is a Tourist information centre manager.    ADVANCED DIRECTIVES: not in place   HEALTH MAINTENANCE: Social History  Substance Use Topics  . Smoking status: Never Smoker  . Smokeless tobacco: Never Used  . Alcohol use 1.2 oz/week    2 Glasses of wine per week     Comment: occasionally     Colonoscopy: never  PAP:  Bone density:[facility]05/08/2016, with a lowest T score of -1.2  Lipid panel:  Allergies  Allergen Reactions  . Amlodipine Swelling    LE edema    Current Outpatient Prescriptions  Medication Sig Dispense Refill  . anastrozole (ARIMIDEX) 1 MG tablet Take 1 tablet (1 mg total) by mouth daily. (Patient not taking: Reported on 01/24/2017)  90 tablet 4  . aspirin 81 MG tablet Take 81 mg by mouth daily.    . Cholecalciferol (VITAMIN D3) 1000 UNITS CAPS Take 1,000 Units by mouth daily.     . cloNIDine (CATAPRES) 0.2 MG tablet Take 0.2 mg by mouth 2 (two) times daily.    Marland Kitchen lisinopril-hydrochlorothiazide (PRINZIDE,ZESTORETIC) 20-25 MG per tablet Take 1 tablet by mouth daily.    . metFORMIN (GLUCOPHAGE-XR) 500 MG 24 hr tablet Take 1 tablet by mouth daily.    . pravastatin (PRAVACHOL) 20 MG tablet Take 1 tablet by mouth daily.    Marland Kitchen terazosin (HYTRIN) 2 MG capsule Take 2 mg by mouth at bedtime.    . vitamin B-12 (CYANOCOBALAMIN) 1000 MCG tablet Take 1,000 mcg by mouth daily.     No current facility-administered medications for this visit.     OBJECTIVE: middle-aged African-American woman Who appears older than stated age 56:   06/01/17 1308  BP: (!) 177/62  Pulse: 78  Resp: 18  Temp: 97.6 F (36.4 C)  SpO2: 97%     Body mass index is 39.41 kg/m.    ECOG FS:1 - Symptomatic but completely ambulatory  Sclerae unicteric, pupils round and equal Oropharynx  clear and moist No cervical or supraclavicular adenopathy Lungs no rales or rhonchi Heart regular rate and rhythm Abd soft, nontender, positive bowel sounds MSK no focal spinal tenderness, no upper extremity lymphedema; uses a walker Neuro: nonfocal, well oriented, appropriate affect Breasts: Status post bilateral reduction mammoplasty; the left breast is also status post lumpectomy and radiation. There is no evidence of local recurrence. Both axillae are benign.  LAB RESULTS:  CMP     Component Value Date/Time   NA 131 (L) 04/22/2016 1516   NA 130 (L) 03/03/2016 0820   K 3.7 04/22/2016 1516   K 4.7 03/03/2016 0820   CL 101 04/22/2016 1516   CO2 24 04/22/2016 1516   CO2 24 03/03/2016 0820   GLUCOSE 145 (H) 04/22/2016 1516   GLUCOSE 127 03/03/2016 0820   BUN 10 04/22/2016 1516   BUN 13.5 03/03/2016 0820   CREATININE 0.82 04/22/2016 1516   CREATININE 1.0  03/03/2016 0820   CALCIUM 8.5 (L) 04/22/2016 1516   CALCIUM 9.5 03/03/2016 0820   PROT 7.8 03/03/2016 0820   ALBUMIN 3.2 (L) 03/03/2016 0820   AST 11 03/03/2016 0820   ALT 14 03/03/2016 0820   ALKPHOS 49 03/03/2016 0820   BILITOT 0.58 03/03/2016 0820   GFRNONAA >60 04/22/2016 1516   GFRAA >60 04/22/2016 1516    INo results found for: SPEP, UPEP  Lab Results  Component Value Date   WBC 24.7 (H) 04/22/2016   NEUTROABS 7.9 (H) 03/03/2016   HGB 10.8 (L) 04/22/2016   HCT 32.9 (L) 04/22/2016   MCV 91.9 04/22/2016   PLT 291 04/22/2016      Chemistry      Component Value Date/Time   NA 131 (L) 04/22/2016 1516   NA 130 (L) 03/03/2016 0820   K 3.7 04/22/2016 1516   K 4.7 03/03/2016 0820   CL 101 04/22/2016 1516   CO2 24 04/22/2016 1516   CO2 24 03/03/2016 0820   BUN 10 04/22/2016 1516   BUN 13.5 03/03/2016 0820   CREATININE 0.82 04/22/2016 1516   CREATININE 1.0 03/03/2016 0820   GLU 131 08/27/2015      Component Value Date/Time   CALCIUM 8.5 (L) 04/22/2016 1516   CALCIUM 9.5 03/03/2016 0820   ALKPHOS 49 03/03/2016 0820   AST 11 03/03/2016 0820   ALT 14 03/03/2016 0820   BILITOT 0.58 03/03/2016 0820       No results found for: LABCA2  No components found for: LABCA125  No results for input(s): INR in the last 168 hours.  Urinalysis No results found for: COLORURINE, APPEARANCEUR, LABSPEC, PHURINE, GLUCOSEU, HGBUR, BILIRUBINUR, KETONESUR, PROTEINUR, UROBILINOGEN, NITRITE, LEUKOCYTESUR    ELIGIBLE FOR AVAILABLE RESEARCH PROTOCOL: no  STUDIES: Mammography at [facility] may 2018 reportedly found no evidence of malignancy. Report is pending  ASSESSMENT: 71 y.o.  [place] woman status post left breast overlapping sites biopsy 02/18/2016 for an invasive ductal carcinoma, grade 1, estrogen and progesterone receptor strongly positive, HER-2 not amplified. (This has been majority of the tumor sample was in situ)  (1) status post left lumpectomy and sentinel lymph node  sampling 04/22/2016 for a pT1a pN0 invasive ductal carcinoma, grade 1, with negative margins.   (2) adjuvant radiation 08/02/16 - 08/30/16 Site/dose:    1) Left breast: 42.5 Gy in 17 fractions 2) Left breast boost: 7.5 Gy in 3 fractions  (3) the patient does not meet criteria for genetics testing  (4) anastrozole prescribed 10/28/2016 but never started by patient  (a) DEXA scan at  [facility] 02/06/2016 showed a T score of -1.2.  PLAN Fannieis now a little over a year out from definitive surgery for her breast cancer with no evidence of disease recurrence. This is favorable.  She never started anastrozole. We reviewed the possible side effects toxicity type of occasions of this agent and I reassured her that many patients actually tolerated quite well. She really has a drug social minus will give it a try. If she has side effects she can simply stop.  On the other hand she had a T1a tumor and the prognosis of those very small cancers even without systemic therapy is generally good. NCCN guidelines suggest consideration of anti-estrogens not recommendation.  She will see me again in June of next year after her mammograms. The plan is for follow-up for 5 years after which she will "graduate".  If she does take the anastrozole and there are any problems she will let me know.       :Chauncey Cruel, MD   06/01/2017 1:26 PM Medical Oncology and Hematology [facility] [address] [place], [place] [address] Tel. [phone]    Fax. [phone]

## 2018-02-28 ENCOUNTER — Telehealth: Payer: Self-pay | Admitting: Oncology

## 2018-02-28 NOTE — Telephone Encounter (Signed)
Patient called to cancel °

## 2018-03-01 ENCOUNTER — Inpatient Hospital Stay: Payer: Medicare Other | Admitting: Oncology

## 2018-03-01 ENCOUNTER — Inpatient Hospital Stay: Payer: Medicare Other

## 2018-04-26 ENCOUNTER — Telehealth: Payer: Self-pay | Admitting: Adult Health

## 2018-04-26 NOTE — Telephone Encounter (Signed)
Patient called to schedule her yrly that was missed

## 2018-05-01 ENCOUNTER — Inpatient Hospital Stay: Payer: Medicare Other | Attending: Adult Health

## 2018-05-01 ENCOUNTER — Inpatient Hospital Stay (HOSPITAL_BASED_OUTPATIENT_CLINIC_OR_DEPARTMENT_OTHER): Payer: Medicare Other | Admitting: Adult Health

## 2018-05-01 ENCOUNTER — Telehealth: Payer: Self-pay | Admitting: Oncology

## 2018-05-01 ENCOUNTER — Encounter: Payer: Self-pay | Admitting: Adult Health

## 2018-05-01 VITALS — BP 200/81 | HR 67 | Temp 97.8°F | Resp 18 | Ht 65.5 in | Wt 246.3 lb

## 2018-05-01 DIAGNOSIS — Z170 Estrogen receptor positive status [ER+]: Secondary | ICD-10-CM | POA: Insufficient documentation

## 2018-05-01 DIAGNOSIS — C50112 Malignant neoplasm of central portion of left female breast: Secondary | ICD-10-CM

## 2018-05-01 LAB — CBC WITH DIFFERENTIAL/PLATELET
Basophils Absolute: 0 10*3/uL (ref 0.0–0.1)
Basophils Relative: 0 %
EOS ABS: 0.1 10*3/uL (ref 0.0–0.5)
EOS PCT: 1 %
HCT: 35.9 % (ref 34.8–46.6)
Hemoglobin: 11.8 g/dL (ref 11.6–15.9)
Lymphocytes Relative: 20 %
Lymphs Abs: 2 10*3/uL (ref 0.9–3.3)
MCH: 30.7 pg (ref 25.1–34.0)
MCHC: 32.9 g/dL (ref 31.5–36.0)
MCV: 93.5 fL (ref 79.5–101.0)
MONO ABS: 0.7 10*3/uL (ref 0.1–0.9)
Monocytes Relative: 7 %
Neutro Abs: 6.9 10*3/uL — ABNORMAL HIGH (ref 1.5–6.5)
Neutrophils Relative %: 72 %
PLATELETS: 234 10*3/uL (ref 145–400)
RBC: 3.84 MIL/uL (ref 3.70–5.45)
RDW: 13.6 % (ref 11.2–14.5)
WBC: 9.7 10*3/uL (ref 3.9–10.3)

## 2018-05-01 LAB — COMPREHENSIVE METABOLIC PANEL
ALK PHOS: 54 U/L (ref 38–126)
ALT: 18 U/L (ref 0–44)
AST: 11 U/L — AB (ref 15–41)
Albumin: 3 g/dL — ABNORMAL LOW (ref 3.5–5.0)
Anion gap: 9 (ref 5–15)
BUN: 11 mg/dL (ref 8–23)
CALCIUM: 8.4 mg/dL — AB (ref 8.9–10.3)
CHLORIDE: 101 mmol/L (ref 98–111)
CO2: 25 mmol/L (ref 22–32)
CREATININE: 0.91 mg/dL (ref 0.44–1.00)
GFR calc non Af Amer: >60 mL/min (ref >60)
Glucose, Bld: 129 mg/dL — ABNORMAL HIGH (ref 70–99)
Potassium: 3.8 mmol/L (ref 3.5–5.1)
Sodium: 135 mmol/L (ref 135–145)
Total Bilirubin: 0.6 mg/dL (ref 0.3–1.2)
Total Protein: 7.2 g/dL (ref 6.5–8.1)

## 2018-05-01 NOTE — Progress Notes (Signed)
[place]  Telephone:([phone] Fax:(336) [phone]     ID: Tanya Juarez DOB: 01-30-1946  MR#: [id number]  KCM#:[id number]  Patient Care Team: Thurman Coyer, MD as PCP - General (Internal Medicine) Alphonsa Overall, MD as Consulting Physician (General Surgery) Magrinat, Virgie Dad, MD as Consulting Physician (Oncology) Kyung Rudd, MD as Consulting Physician (Radiation Oncology) Dillingham, Loel Lofty, DO as Consulting Physician (Plastic Surgery) Delice Bison, Charlestine Massed, NP as Nurse Practitioner (Hematology and Oncology) PCP: Thea Silversmith Dianna Rossetti, MD GYN: OTHER MD:  CHIEF COMPLAINT: 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 This foundnew calcifications in the left breastcentral to the nipple On 02/13/2016 the patient had unilateral left diagnostic mammography, which found the breast composition to be category be.In the left breast central to the nipple there were grouped heterogeneous calcifications measuring 2.5 cm.   Biopsy of this area 02/18/2016 showed ([id number])ductal carcinoma in situ, grade 1, with a very small area of microinvasive breast cancer, also grade 1.The ductal carcinoma in situ was estrogen and progesterone receptor positive, both at 90%, both with strong staining intensity.The invasive tumor or was HER-2 not amplified, with a signals ratio 1.31and the number per cell 3.15. There was not sufficient tissue for MIB-1  On 02/27/2016 the patient underwent left axillary ultrasound, which was normal.  Her subsequent history is as detailed below.  INTERVAL HISTORY: Tanya Juarez returns today for follow-up of her estrogen receptor positive breast cancer. She has undergone her annual breast mammogram at the end of June at [facility].  We do not yet have those records, though the patient tells me that it was  normal.  She saw Dr. Lucia Gaskins in 10/2017 and that visit went well.  Tanya Juarez is feeling well today.    Tanya Juarez sees her PCP regularly.  She returns to him 05/2018.  Her blood pressure is elevated, due to a recent medication change (ARB recalls and backorders).  She checks her blood pressure at home with a wrist cuff was 056P systolic.  She denies any symptoms with this.    REVIEW OF SYSTEMS: Tanya Juarez is feeling well today.  She is waiting to have colonoscopy due to high copay.  She will due this at the beginning of 2020.  She says she is up to date  With the rest of her cancer screenings.  She is a never smoker.    Tanya Juarez is independent, she has no intentional exercise.  Otherwise she is without fevers, chills, headaches, vision changes.  She denies chest pain, palpitations, shortness of breath, cough.  She notes abdominal concerns such as pain, diarrhea, constipation, nausea and/or vomiting.  A detailed ROS was otherwise non contributory today.   PAST MEDICAL HISTORY: Past Medical History:  Diagnosis Date  . Cancer of left breast ([place])   . Diabetic peripheral neuropathy ([place])   . Full dentures   . Hyperlipidemia   . Hypertension   . Neuropathy   . Type II diabetes mellitus ([place])   . Weakness of both legs     PAST SURGICAL HISTORY: Past Surgical History:  Procedure Laterality Date  . APPENDECTOMY  1971  . BREAST BIOPSY Left 01/2016  . BREAST LUMPECTOMY WITH RADIOACTIVE SEED AND SENTINEL LYMPH NODE BIOPSY Left 04/22/2016   Procedure: LEFT BREAST LUMPECTOMY WITH RADIOACTIVE SEED AND SENTINEL LYMPH NODE BIOPSY;  Surgeon: Alphonsa Overall, MD;  Location: [place];  Service: General;  Laterality:  Left;  . BREAST REDUCTION SURGERY Bilateral 04/22/2016   Procedure: BILATERAL IMMEDIATE MAMMARY REDUCTION  (BREAST);  Surgeon: Loel Lofty Dillingham, DO;  Location: [place];  Service: Plastics;  Laterality: Bilateral;  . CESAREAN SECTION     1971  . CESAREAN SECTION WITH BILATERAL TUBAL LIGATION  1973  . FRACTURE  SURGERY    . I&D EXTREMITY Left 08/19/2015   Procedure: IRRIGATION AND DEBRIDEMENT LEFT ANKLE FRACTURE;  Surgeon: Leandrew Koyanagi, MD;  Location: [place];  Service: Orthopedics;  Laterality: Left;  Marland Kitchen MASTECTOMY COMPLETE / SIMPLE W/ SENTINEL NODE BIOPSY Left 04/22/2016   WITH RADIOACTIVE SEED   . ORIF ANKLE FRACTURE Left 08/20/2015   Procedure: OPEN REDUCTION INTERNAL FIXATION (ORIF) LEFT ANKLE FRACTURE, SYNDESMOSIS INJURY;  Surgeon: Leandrew Koyanagi, MD;  Location: [place];  Service: Orthopedics;  Laterality: Left;  . REDUCTION MAMMAPLASTY Bilateral 04/22/2016    FAMILY HISTORY Family History  Problem Relation Age of Onset  . Liver cancer Mother   . Lung cancer Father   . Hypertension Unknown   the patient's father died at the age of 62 possibly from cancer. The patient tells me he was of)" they didn't tell him what he had". The patient's mother died from pancreatic cancer metastatic to the [place] age 79. The patient had 4 brothers and 4 sisters. One sister was diagnosed with breast cancer at age 88. This metastasizedand eventually took her life. There is no other history of breast cancer in the family and no history of ovarian cancer.  GYNECOLOGIC HISTORY:  No LMP recorded. Patient is postmenopausal. Menarche age 70, first live birthage 71, the patient is GX P2. She stopped having periods in her early 40s. She did not take hormone replacement. She never used oral contraception.  SOCIAL HISTORY:  The patient used to work at [employer]. She is now retired. She is a widow. She lives by herself, with no pets. Her sons are A, who lives in [place] and works as a Administrator, and Lennette Bihari, lives in [place], and works as a Consulting civil engineer. The patient has 6 grandchildren. She is a Tourist information centre manager.    ADVANCED DIRECTIVES: not in place   HEALTH MAINTENANCE: Social History   Tobacco Use  . Smoking status: Never Smoker  . Smokeless tobacco: Never Used  Substance Use Topics  . Alcohol use: Yes      Alcohol/week: 1.2 oz    Types: 2 Glasses of wine per week    Comment: occasionally  . Drug use: No     Colonoscopy: never  PAP:  Bone density:[facility]05/08/2016, with a lowest T score of -1.2  Lipid panel:  Allergies  Allergen Reactions  . Amlodipine Swelling    LE edema    Current Outpatient Medications  Medication Sig Dispense Refill  . aspirin 81 MG tablet Take 81 mg by mouth daily.    . Cholecalciferol (VITAMIN D3) 1000 UNITS CAPS Take 1,000 Units by mouth daily.     . cloNIDine (CATAPRES) 0.2 MG tablet Take 0.2 mg by mouth 2 (two) times daily.    . hydrochlorothiazide (HYDRODIURIL) 12.5 MG tablet Take 12.5 mg by mouth daily.  3  . irbesartan (AVAPRO) 300 MG tablet Take 300 mg by mouth at bedtime.  3  . metFORMIN (GLUCOPHAGE-XR) 500 MG 24 hr tablet Take 1 tablet by mouth daily.    . pravastatin (PRAVACHOL) 20 MG tablet Take 1 tablet by mouth daily.    Marland Kitchen terazosin (HYTRIN) 2 MG capsule Take 2 mg by  mouth at bedtime.    . vitamin B-12 (CYANOCOBALAMIN) 1000 MCG tablet Take 1,000 mcg by mouth daily.     No current facility-administered medications for this visit.     OBJECTIVE:  Vitals:   05/01/18 1136  BP: (!) 200/81  Pulse: 67  Resp: 18  Temp: 97.8 F (36.6 C)  SpO2: 98%     Body mass index is 40.36 kg/m.    ECOG FS:1 - Symptomatic but completely ambulatory GENERAL: Patient is a well appearing female in no acute distress (unable to get on exam table, exam limited due to this) HEENT:  Sclerae anicteric.  Oropharynx clear and moist. No ulcerations or evidence of oropharyngeal candidiasis. Neck is supple.  NODES:  No cervical, supraclavicular, or axillary lymphadenopathy palpated.  BREAST EXAM:  Left breast s/p lumpectomy, there is a hardened area in her upper outer left breast about 2 cm.  She notes that it was noted on mammogram and found to be fat necrosis. No change since then.  Otherwise left breast is s/p lumpectomy without any further changes, right breast without  nodules, masses, skin or nipple change.   LUNGS:  Clear to auscultation bilaterally.  No wheezes or rhonchi. HEART:  Regular rate and rhythm. No murmur appreciated. ABDOMEN:  Soft, nontender.  Positive, normoactive bowel sounds. No organomegaly palpated. MSK:  No focal spinal tenderness to palpation. Full range of motion bilaterally in the upper extremities. EXTREMITIES:  No peripheral edema.   SKIN:  Clear with no obvious rashes or skin changes. No nail dyscrasia. NEURO:  Nonfocal. Well oriented.  Appropriate affect.    LAB RESULTS:  CMP     Component Value Date/Time   NA 135 05/01/2018 1034   NA 130 (L) 03/03/2016 0820   K 3.8 05/01/2018 1034   K 4.7 03/03/2016 0820   CL 101 05/01/2018 1034   CO2 25 05/01/2018 1034   CO2 24 03/03/2016 0820   GLUCOSE 129 (H) 05/01/2018 1034   GLUCOSE 127 03/03/2016 0820   BUN 11 05/01/2018 1034   BUN 13.5 03/03/2016 0820   CREATININE 0.91 05/01/2018 1034   CREATININE 1.0 03/03/2016 0820   CALCIUM 8.4 (L) 05/01/2018 1034   CALCIUM 9.5 03/03/2016 0820   PROT 7.2 05/01/2018 1034   PROT 7.8 03/03/2016 0820   ALBUMIN 3.0 (L) 05/01/2018 1034   ALBUMIN 3.2 (L) 03/03/2016 0820   AST 11 (L) 05/01/2018 1034   AST 11 03/03/2016 0820   ALT 18 05/01/2018 1034   ALT 14 03/03/2016 0820   ALKPHOS 54 05/01/2018 1034   ALKPHOS 49 03/03/2016 0820   BILITOT 0.6 05/01/2018 1034   BILITOT 0.58 03/03/2016 0820   GFRNONAA >60 05/01/2018 1034   GFRAA >60 05/01/2018 1034    INo results found for: SPEP, UPEP  Lab Results  Component Value Date   WBC 9.7 05/01/2018   NEUTROABS 6.9 (H) 05/01/2018   HGB 11.8 05/01/2018   HCT 35.9 05/01/2018   MCV 93.5 05/01/2018   PLT 234 05/01/2018      Chemistry      Component Value Date/Time   NA 135 05/01/2018 1034   NA 130 (L) 03/03/2016 0820   K 3.8 05/01/2018 1034   K 4.7 03/03/2016 0820   CL 101 05/01/2018 1034   CO2 25 05/01/2018 1034   CO2 24 03/03/2016 0820   BUN 11 05/01/2018 1034   BUN 13.5  03/03/2016 0820   CREATININE 0.91 05/01/2018 1034   CREATININE 1.0 03/03/2016 0820   GLU 131 08/27/2015  Component Value Date/Time   CALCIUM 8.4 (L) 05/01/2018 1034   CALCIUM 9.5 03/03/2016 0820   ALKPHOS 54 05/01/2018 1034   ALKPHOS 49 03/03/2016 0820   AST 11 (L) 05/01/2018 1034   AST 11 03/03/2016 0820   ALT 18 05/01/2018 1034   ALT 14 03/03/2016 0820   BILITOT 0.6 05/01/2018 1034   BILITOT 0.58 03/03/2016 0820       No results found for: LABCA2  No components found for: LABCA125  No results for input(s): INR in the last 168 hours.  Urinalysis No results found for: COLORURINE, APPEARANCEUR, LABSPEC, PHURINE, GLUCOSEU, HGBUR, BILIRUBINUR, KETONESUR, PROTEINUR, UROBILINOGEN, NITRITE, LEUKOCYTESUR    ELIGIBLE FOR AVAILABLE RESEARCH PROTOCOL: no  STUDIES: Mammography at [facility] may 2018 reportedly found no evidence of malignancy. Report is pending  ASSESSMENT: 72 y.o.  [place] woman status post left breast overlapping sites biopsy 02/18/2016 for an invasive ductal carcinoma, grade 1, estrogen and progesterone receptor strongly positive, HER-2 not amplified. (This has been majority of the tumor sample was in situ)  (1) status post left lumpectomy and sentinel lymph node sampling 04/22/2016 for a pT1a pN0 invasive ductal carcinoma, grade 1, with negative margins.   (2) adjuvant radiation 08/02/16 - 08/30/16 Site/dose:    1) Left breast: 42.5 Gy in 17 fractions 2) Left breast boost: 7.5 Gy in 3 fractions  (3) the patient does not meet criteria for genetics testing  (4) anastrozole prescribed 10/28/2016 but never started by patient  (a) D[facility] scan at [facility] 02/06/2016 showed a T score of -1.2.  PLAN Tanya Juarez is doing well today.  She is clinically and radiographically without any sign of breast cancer recurrence.  She is happy to hear this.  She will continue to undergo annual mammograms in 03/2019.  She will also see Dr. Lucia Gaskins every February, while we see her every  August.  She has continued off of Anastrozole.    Tanya Juarez and I reviewed health maintenance recommendations including continuing to see her PCP regularly, undergoing her cancer screenings when due, and healthy diet and exercise.  I also gave her a handout on bone health today.    Tanya Juarez verbalizes understanding of the above and says she will work on all of these.  She will return in one year for f/u with Dr. Jana Hakim.  She knows to call for any questions or concerns that may develop between now and then.    A total of (30) minutes of face-to-face time was spent with this patient with greater than 50% of that time in counseling and care-coordination.    Scot Dock, NP   05/01/2018 11:49 AM Medical Oncology and Hematology [facility] [address] [place], [place] [address] Tel. [phone]    Fax. [phone]

## 2018-05-01 NOTE — Telephone Encounter (Signed)
Gave patient avs and calendar.   °

## 2018-05-01 NOTE — Patient Instructions (Signed)
Bone Health Bones protect organs, store calcium, and anchor muscles. Good health habits, such as eating nutritious foods and exercising regularly, are important for maintaining healthy bones. They can also help to prevent a condition that causes bones to lose density and become weak and brittle (osteoporosis). Why is bone mass important? Bone mass refers to the amount of bone tissue that you have. The higher your bone mass, the stronger your bones. An important step toward having healthy bones throughout life is to have strong and dense bones during childhood. A young adult who has a high bone mass is more likely to have a high bone mass later in life. Bone mass at its greatest it is called peak bone mass. A large decline in bone mass occurs in older adults. In women, it occurs about the time of menopause. During this time, it is important to practice good health habits, because if more bone is lost than what is replaced, the bones will become less healthy and more likely to break (fracture). If you find that you have a low bone mass, you may be able to prevent osteoporosis or further bone loss by changing your diet and lifestyle. How can I find out if my bone mass is low? Bone mass can be measured with an X-ray test that is called a bone mineral density (BMD) test. This test is recommended for all women who are age 65 or older. It may also be recommended for men who are age 70 or older, or for people who are more likely to develop osteoporosis due to:  Having bones that break easily.  Having a long-term disease that weakens bones, such as kidney disease or rheumatoid arthritis.  Having menopause earlier than normal.  Taking medicine that weakens bones, such as steroids, thyroid hormones, or hormone treatment for breast cancer or prostate cancer.  Smoking.  Drinking three or more alcoholic drinks each day.  What are the nutritional recommendations for healthy bones? To have healthy bones, you  need to get enough of the right minerals and vitamins. Most nutrition experts recommend getting these nutrients from the foods that you eat. Nutritional recommendations vary from person to person. Ask your health care provider what is healthy for you. Here are some general guidelines. Calcium Recommendations Calcium is the most important (essential) mineral for bone health. Most people can get enough calcium from their diet, but supplements may be recommended for people who are at risk for osteoporosis. Good sources of calcium include:  Dairy products, such as low-fat or nonfat milk, cheese, and yogurt.  Dark green leafy vegetables, such as bok choy and broccoli.  Calcium-fortified foods, such as orange juice, cereal, bread, soy beverages, and tofu products.  Nuts, such as almonds.  Follow these recommended amounts for daily calcium intake:  Children, age 1?3: 700 mg.  Children, age 4?8: 1,000 mg.  Children, age 9?13: 1,300 mg.  Teens, age 14?18: 1,300 mg.  Adults, age 19?50: 1,000 mg.  Adults, age 51?70: ? Men: 1,000 mg. ? Women: 1,200 mg.  Adults, age 71 or older: 1,200 mg.  Pregnant and breastfeeding females: ? Teens: 1,300 mg. ? Adults: 1,000 mg.  Vitamin D Recommendations Vitamin D is the most essential vitamin for bone health. It helps the body to absorb calcium. Sunlight stimulates the skin to make vitamin D, so be sure to get enough sunlight. If you live in a cold climate or you do not get outside often, your health care provider may recommend that you take vitamin   D supplements. Good sources of vitamin D in your diet include:  Egg yolks.  Saltwater fish.  Milk and cereal fortified with vitamin D.  Follow these recommended amounts for daily vitamin D intake:  Children and teens, age 1?18: 600 international units.  Adults, age 50 or younger: 400-800 international units.  Adults, age 51 or older: 800-1,000 international units.  Other Nutrients Other nutrients  for bone health include:  Phosphorus. This mineral is found in meat, poultry, dairy foods, nuts, and legumes. The recommended daily intake for adult men and adult women is 700 mg.  Magnesium. This mineral is found in seeds, nuts, dark green vegetables, and legumes. The recommended daily intake for adult men is 400?420 mg. For adult women, it is 310?320 mg.  Vitamin K. This vitamin is found in green leafy vegetables. The recommended daily intake is 120 mg for adult men and 90 mg for adult women.  What type of physical activity is best for building and maintaining healthy bones? Weight-bearing and strength-building activities are important for building and maintaining peak bone mass. Weight-bearing activities cause muscles and bones to work against gravity. Strength-building activities increases muscle strength that supports bones. Weight-bearing and muscle-building activities include:  Walking and hiking.  Jogging and running.  Dancing.  Gym exercises.  Lifting weights.  Tennis and racquetball.  Climbing stairs.  Aerobics.  Adults should get at least 30 minutes of moderate physical activity on most days. Children should get at least 60 minutes of moderate physical activity on most days. Ask your health care provide what type of exercise is best for you. Where can I find more information? For more information, check out the following websites:  National Osteoporosis Foundation: http://nof.org/learn/basics  National Institutes of Health: http://www.niams.nih.gov/Health_Info/Bone/Bone_Health/bone_health_for_life.asp  This information is not intended to replace advice given to you by your health care provider. Make sure you discuss any questions you have with your health care provider. Document Released: 12/04/2003 Document Revised: 04/02/2016 Document Reviewed: 09/18/2014 Elsevier Interactive Patient Education  2018 [employer]  

## 2019-03-29 ENCOUNTER — Encounter: Payer: Self-pay | Admitting: Oncology

## 2019-05-03 ENCOUNTER — Other Ambulatory Visit: Payer: Medicare Other

## 2019-05-03 ENCOUNTER — Ambulatory Visit: Payer: Medicare Other | Admitting: Oncology

## 2019-05-09 ENCOUNTER — Encounter: Payer: Self-pay | Admitting: Oncology

## 2019-05-09 NOTE — Progress Notes (Signed)
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   Since her last visit, she underwent bilateral diagnostic mammography with tomography at [facility] on 03/29/2019 showing: breast density category B; no evidence of malignancy in either breast.   REVIEW OF SYSTEMS: Tanya Juarez reports she has changed her PCP since her last visit. She lives by herself. Her grandson lives with her during school, so he will be moving back in with her this Saturday. Her son does the shopping for her. She does not exercise. Prior to the pandemic, she used to walk around the grocery store and at church. A detailed review of systems was otherwise stable.   BREAST CANCER HISTORY: From the original intake note:  Tanya Juarez had routine screening mammography at [facility] 02/06/2016. This foundnew calcifications in the left breastcentral to the nipple On 02/13/2016 the patient had unilateral left diagnostic mammography, which found the breast composition to be category be.In the left breast central to the nipple there were grouped heterogeneous calcifications measuring 2.5 cm.   Biopsy of this area 02/18/2016 showed ([id number])ductal carcinoma in situ, grade 1, with a very small area of microinvasive breast cancer, also grade 1.The  ductal carcinoma in situ was estrogen and progesterone receptor positive, both at 90%, both with strong staining intensity.The invasive tumor or was HER-2 not amplified, with a signals ratio 1.31and the number per cell 3.15. There was not sufficient tissue for MIB-1  On 02/27/2016 the patient underwent left axillary ultrasound, which was normal.  Her subsequent history is as detailed below.   PAST MEDICAL HISTORY: Past Medical History:  Diagnosis Date  . Cancer of left breast ([place])   . Diabetic peripheral neuropathy ([place])   . Full dentures   . Hyperlipidemia   . Hypertension   . Neuropathy   . Type II diabetes mellitus ([place])   . Weakness of both legs     PAST SURGICAL HISTORY: Past Surgical History:  Procedure Laterality Date  . APPENDECTOMY  1971  . BREAST BIOPSY Left 01/2016  . BREAST LUMPECTOMY WITH RADIOACTIVE SEED AND SENTINEL LYMPH NODE BIOPSY Left 04/22/2016   Procedure: LEFT BREAST LUMPECTOMY WITH RADIOACTIVE SEED AND SENTINEL LYMPH NODE BIOPSY;  Surgeon: Alphonsa Overall, MD;  Location: [facility];  Service: General;  Laterality: Left;  . BREAST REDUCTION SURGERY Bilateral 04/22/2016   Procedure: BILATERAL IMMEDIATE MAMMARY REDUCTION  (BREAST);  Surgeon: Loel Lofty Dillingham, DO;  Location: [facility];  Service: Plastics;  Laterality: Bilateral;  . CESAREAN SECTION     1971  . CESAREAN SECTION WITH BILATERAL TUBAL LIGATION  1973  . FRACTURE SURGERY    . I&D EXTREMITY Left 08/19/2015   Procedure: IRRIGATION AND DEBRIDEMENT LEFT ANKLE FRACTURE;  Surgeon: Leandrew Koyanagi, MD;  Location: [facility];  Service: Orthopedics;  Laterality: Left;  Marland Kitchen MASTECTOMY COMPLETE / SIMPLE W/ SENTINEL NODE BIOPSY Left 04/22/2016   WITH RADIOACTIVE SEED   . ORIF ANKLE FRACTURE  Left 08/20/2015   Procedure: OPEN REDUCTION INTERNAL FIXATION (ORIF) LEFT ANKLE FRACTURE, SYNDESMOSIS INJURY;  Surgeon: Leandrew Koyanagi, MD;  Location: [place];  Service: Orthopedics;  Laterality: Left;  . REDUCTION MAMMAPLASTY Bilateral 04/22/2016     FAMILY HISTORY Family History  Problem Relation Age of Onset  . Liver cancer Mother   . Lung cancer Father   . Hypertension Other   the patient's father died at the age of 28 possibly from cancer. The patient tells me he was of)" they didn't tell him what he had". The patient's mother died from pancreatic cancer metastatic to the [place] age 36. The patient had 4 brothers and 4 sisters. One sister was diagnosed with breast cancer at age 80. This metastasized and eventually took her life. There is no other history of breast cancer in the family and no history of ovarian cancer.   GYNECOLOGIC HISTORY:  No LMP recorded. Patient is postmenopausal. Menarche age 68, first live birthage 20, the patient is GX P2. She stopped having periods in her early 87s. She did not take hormone replacement. She never used oral contraception.   SOCIAL HISTORY:  The patient used to work at [employer]. She is now retired. She is a widow. She lives by herself, with no pets. Her sons are A, who lives in [place] and works as a Administrator, and Lennette Bihari, lives in [place], and works as a Consulting civil engineer. The patient has 6 grandchildren. She is a Tourist information centre manager.    ADVANCED DIRECTIVES: not in place   HEALTH MAINTENANCE: Social History   Tobacco Use  . Smoking status: Never Smoker  . Smokeless tobacco: Never Used  Substance Use Topics  . Alcohol use: Yes    Alcohol/week: 2.0 standard drinks    Types: 2 Glasses of wine per week    Comment: occasionally  . Drug use: No     Colonoscopy: never  PAP:  Bone density:[facility]05/08/2016, with a lowest T score of -1.2  Lipid panel:  Allergies  Allergen Reactions  . Amlodipine Swelling    LE edema    Current Outpatient Medications  Medication Sig Dispense Refill  . aspirin 81 MG tablet Take 81 mg by mouth daily.    . carvedilol (COREG) 3.125 MG tablet Take 1 tablet (3.125 mg total) by mouth 2 (two) times daily with a meal.    . Cholecalciferol  (VITAMIN D3) 1000 UNITS CAPS Take 1,000 Units by mouth daily.     . cloNIDine (CATAPRES) 0.2 MG tablet Take 0.2 mg by mouth 2 (two) times daily.    . hydrochlorothiazide (HYDRODIURIL) 12.5 MG tablet Take 12.5 mg by mouth daily.  3  . pravastatin (PRAVACHOL) 20 MG tablet Take 1 tablet by mouth daily.    Marland Kitchen spironolactone (ALDACTONE) 25 MG tablet Take 1 tablet (25 mg total) by mouth daily.    . vitamin B-12 (CYANOCOBALAMIN) 1000 MCG tablet Take 1,000 mcg by mouth daily.     No current facility-administered medications for this visit.     OBJECTIVE: Middle-aged African-American woman who looks older than stated age 17:   05/10/19 1358  BP: (!) 164/99  Pulse: 78  Resp: 18  Temp: 98.2 F (36.8 C)  SpO2: 95%     There is no height or weight on file to calculate BMI.    ECOG FS:1 - Symptomatic but completely ambulatory  Sclerae unicteric, EOMs intact Wearing a mask No cervical or supraclavicular adenopathy Lungs no rales or rhonchi Heart regular rate and rhythm  Abd soft, obese, nontender, positive bowel sounds MSK no focal spinal tenderness, no upper extremity lymphedema Neuro: nonfocal, well oriented, appropriate affect Breasts: Both breasts are status post reduction mammoplasty.  The right breast is otherwise unremarkable.  The left breast is status post lumpectomy and radiation.  There is no evidence of local recurrence.  Both axillae are benign.  LAB RESULTS:  CMP     Component Value Date/Time   NA 135 05/01/2018 1034   NA 130 (L) 03/03/2016 0820   K 3.8 05/01/2018 1034   K 4.7 03/03/2016 0820   CL 101 05/01/2018 1034   CO2 25 05/01/2018 1034   CO2 24 03/03/2016 0820   GLUCOSE 129 (H) 05/01/2018 1034   GLUCOSE 127 03/03/2016 0820   BUN 11 05/01/2018 1034   BUN 13.5 03/03/2016 0820   CREATININE 0.91 05/01/2018 1034   CREATININE 1.0 03/03/2016 0820   CALCIUM 8.4 (L) 05/01/2018 1034   CALCIUM 9.5 03/03/2016 0820   PROT 7.2 05/01/2018 1034   PROT 7.8 03/03/2016 0820    ALBUMIN 3.0 (L) 05/01/2018 1034   ALBUMIN 3.2 (L) 03/03/2016 0820   AST 11 (L) 05/01/2018 1034   AST 11 03/03/2016 0820   ALT 18 05/01/2018 1034   ALT 14 03/03/2016 0820   ALKPHOS 54 05/01/2018 1034   ALKPHOS 49 03/03/2016 0820   BILITOT 0.6 05/01/2018 1034   BILITOT 0.58 03/03/2016 0820   GFRNONAA >60 05/01/2018 1034   GFRAA >60 05/01/2018 1034    INo results found for: SPEP, UPEP  Lab Results  Component Value Date   WBC 11.9 (H) 05/10/2019   NEUTROABS 8.2 (H) 05/10/2019   HGB 12.3 05/10/2019   HCT 37.5 05/10/2019   MCV 95.9 05/10/2019   PLT 223 05/10/2019      Chemistry      Component Value Date/Time   NA 135 05/01/2018 1034   NA 130 (L) 03/03/2016 0820   K 3.8 05/01/2018 1034   K 4.7 03/03/2016 0820   CL 101 05/01/2018 1034   CO2 25 05/01/2018 1034   CO2 24 03/03/2016 0820   BUN 11 05/01/2018 1034   BUN 13.5 03/03/2016 0820   CREATININE 0.91 05/01/2018 1034   CREATININE 1.0 03/03/2016 0820   GLU 131 08/27/2015      Component Value Date/Time   CALCIUM 8.4 (L) 05/01/2018 1034   CALCIUM 9.5 03/03/2016 0820   ALKPHOS 54 05/01/2018 1034   ALKPHOS 49 03/03/2016 0820   AST 11 (L) 05/01/2018 1034   AST 11 03/03/2016 0820   ALT 18 05/01/2018 1034   ALT 14 03/03/2016 0820   BILITOT 0.6 05/01/2018 1034   BILITOT 0.58 03/03/2016 0820       No results found for: LABCA2  No components found for: LABCA125  No results for input(s): INR in the last 168 hours.  Urinalysis No results found for: COLORURINE, APPEARANCEUR, LABSPEC, PHURINE, GLUCOSEU, HGBUR, BILIRUBINUR, KETONESUR, PROTEINUR, UROBILINOGEN, NITRITE, LEUKOCYTESUR   ELIGIBLE FOR AVAILABLE RESEARCH PROTOCOL: no  STUDIES: No results found.   ASSESSMENT: 73 y.o.  Tanya Juarez woman status post left breast overlapping sites biopsy 02/18/2016 for an invasive ductal carcinoma, grade 1, estrogen and progesterone receptor strongly positive, HER-2 not amplified. (This has been majority of the tumor sample was  in situ)  (1) status post left lumpectomy and sentinel lymph node sampling 04/22/2016 for a pT1a pN0 invasive ductal carcinoma, grade 1, with negative margins.   (2) adjuvant radiation 08/02/16 - 08/30/16 Site/dose:    1) Left breast: 42.5  Gy in 17 fractions 2) Left breast boost: 7.5 Gy in 3 fractions  (3) the patient does not meet criteria for genetics testing  (4) anastrozole prescribed 10/28/2016 but never started by patient  (a) DEXA scan at [facility] 02/06/2016 showed a T score of -1.2.  PLAN Tanya Juarez is now just over 3 years out from definitive surgery for breast cancer with no evidence of disease recurrence.  This is very favorable.  She is being very careful regarding the pandemic.  She also appreciates her new physician and feels her sugar and blood pressure are currently doing much better than before.  I urged her to use her silver sneakers as soon as the gyms open up  She will see my nurse practitioner in a year.  She will see me again 2 years from now and at that point she will be ready to "graduate".  She knows to call for any other issues that may develop before the next visit.  Chauncey Cruel, MD   05/10/2019 2:32 PM Medical Oncology and Hematology [facility] [address] [place], [place] [address] Tel. [phone]    Fax. [phone]   I, Wilburn Mylar, am acting as scribe for Dr. Virgie Dad. Magrinat.  I, Lurline Del MD, have reviewed the above documentation for accuracy and completeness, and I agree with the above.

## 2019-05-10 ENCOUNTER — Inpatient Hospital Stay (HOSPITAL_BASED_OUTPATIENT_CLINIC_OR_DEPARTMENT_OTHER): Payer: Medicare Other | Admitting: Oncology

## 2019-05-10 ENCOUNTER — Inpatient Hospital Stay: Payer: Medicare Other | Attending: Oncology

## 2019-05-10 ENCOUNTER — Other Ambulatory Visit: Payer: Self-pay

## 2019-05-10 VITALS — BP 164/99 | HR 78 | Temp 98.2°F | Resp 18

## 2019-05-10 DIAGNOSIS — C50112 Malignant neoplasm of central portion of left female breast: Secondary | ICD-10-CM

## 2019-05-10 DIAGNOSIS — Z170 Estrogen receptor positive status [ER+]: Secondary | ICD-10-CM | POA: Diagnosis not present

## 2019-05-10 LAB — CBC WITH DIFFERENTIAL/PLATELET
Abs Immature Granulocytes: 0.27 10*3/uL — ABNORMAL HIGH (ref 0.00–0.07)
Basophils Absolute: 0.1 10*3/uL (ref 0.0–0.1)
Basophils Relative: 1 %
Eosinophils Absolute: 0.2 10*3/uL (ref 0.0–0.5)
Eosinophils Relative: 2 %
HCT: 37.5 % (ref 36.0–46.0)
Hemoglobin: 12.3 g/dL (ref 12.0–15.0)
Immature Granulocytes: 2 %
Lymphocytes Relative: 17 %
Lymphs Abs: 2 10*3/uL (ref 0.7–4.0)
MCH: 31.5 pg (ref 26.0–34.0)
MCHC: 32.8 g/dL (ref 30.0–36.0)
MCV: 95.9 fL (ref 80.0–100.0)
Monocytes Absolute: 1.1 10*3/uL — ABNORMAL HIGH (ref 0.1–1.0)
Monocytes Relative: 9 %
Neutro Abs: 8.2 10*3/uL — ABNORMAL HIGH (ref 1.7–7.7)
Neutrophils Relative %: 69 %
Platelets: 223 10*3/uL (ref 150–400)
RBC: 3.91 MIL/uL (ref 3.87–5.11)
RDW: 14 % (ref 11.5–15.5)
WBC: 11.9 10*3/uL — ABNORMAL HIGH (ref 4.0–10.5)
nRBC: 0 % (ref 0.0–0.2)

## 2019-05-10 LAB — COMPREHENSIVE METABOLIC PANEL
ALT: 23 U/L (ref 0–44)
AST: 14 U/L — ABNORMAL LOW (ref 15–41)
Albumin: 3.2 g/dL — ABNORMAL LOW (ref 3.5–5.0)
Alkaline Phosphatase: 54 U/L (ref 38–126)
Anion gap: 9 (ref 5–15)
BUN: 17 mg/dL (ref 8–23)
CO2: 21 mmol/L — ABNORMAL LOW (ref 22–32)
Calcium: 9.2 mg/dL (ref 8.9–10.3)
Chloride: 104 mmol/L (ref 98–111)
Creatinine, Ser: 1.02 mg/dL — ABNORMAL HIGH (ref 0.44–1.00)
GFR calc Af Amer: >60 mL/min (ref >60)
GFR calc non Af Amer: 55 mL/min — ABNORMAL LOW (ref >60)
Glucose, Bld: 113 mg/dL — ABNORMAL HIGH (ref 70–99)
Potassium: 4.7 mmol/L (ref 3.5–5.1)
Sodium: 134 mmol/L — ABNORMAL LOW (ref 135–145)
Total Bilirubin: 0.6 mg/dL (ref 0.3–1.2)
Total Protein: 7.7 g/dL (ref 6.5–8.1)

## 2019-05-11 ENCOUNTER — Telehealth: Payer: Self-pay | Admitting: Oncology

## 2019-05-11 NOTE — Telephone Encounter (Signed)
I talk with patient regarding schedule  

## 2020-02-21 MED ORDER — HYDROCHLOROTHIAZIDE 12.5 MG PO TABS
12.50 | ORAL_TABLET | ORAL | Status: DC
Start: 2020-02-20 — End: 2020-02-21

## 2020-02-21 MED ORDER — CYANOCOBALAMIN 1000 MCG PO TABS
1000.00 | ORAL_TABLET | ORAL | Status: DC
Start: 2020-02-20 — End: 2020-02-21

## 2020-02-21 MED ORDER — SODIUM CHLORIDE 0.9 % IV SOLN
10.00 | INTRAVENOUS | Status: DC
Start: ? — End: 2020-02-21

## 2020-02-21 MED ORDER — GENERIC EXTERNAL MEDICATION
Status: DC
Start: ? — End: 2020-02-21

## 2020-02-21 MED ORDER — CLONIDINE HCL 0.1 MG PO TABS
0.10 | ORAL_TABLET | ORAL | Status: DC
Start: ? — End: 2020-02-21

## 2020-02-21 MED ORDER — LOSARTAN POTASSIUM 50 MG PO TABS
100.00 | ORAL_TABLET | ORAL | Status: DC
Start: 2020-02-20 — End: 2020-02-21

## 2020-02-21 MED ORDER — CARVEDILOL 6.25 MG PO TABS
12.50 | ORAL_TABLET | ORAL | Status: DC
Start: 2020-02-19 — End: 2020-02-21

## 2020-05-08 ENCOUNTER — Telehealth: Payer: Self-pay | Admitting: Adult Health

## 2020-05-08 NOTE — Telephone Encounter (Signed)
Called pt to reschedule 8/16 appt per provider template change. Pt stated that she has already called to cancel 8/16 appts and that she will call back to reschedule after she speaks with her surgeon.

## 2020-05-12 ENCOUNTER — Ambulatory Visit: Payer: Medicare Other | Admitting: Adult Health

## 2020-05-12 ENCOUNTER — Other Ambulatory Visit: Payer: Medicare Other

## 2021-07-22 ENCOUNTER — Other Ambulatory Visit: Payer: Self-pay | Admitting: Oncology

## 2021-07-22 NOTE — Progress Notes (Signed)
Tanya Juarez did not come for her September 2021 visit.  She has not rescheduled here.  She is currently being followed at [facility]

## 2021-11-27 ENCOUNTER — Emergency Department (HOSPITAL_COMMUNITY)
Admission: EM | Admit: 2021-11-27 | Discharge: 2021-11-30 | Disposition: A | Discharge status: Skilled Nursing Facility | Payer: Medicare Other | Attending: Emergency Medicine | Admitting: Emergency Medicine

## 2021-11-27 ENCOUNTER — Other Ambulatory Visit: Payer: Self-pay

## 2021-11-27 ENCOUNTER — Emergency Department (HOSPITAL_COMMUNITY): Payer: Medicare Other

## 2021-11-27 DIAGNOSIS — Z85038 Personal history of other malignant neoplasm of large intestine: Secondary | ICD-10-CM | POA: Insufficient documentation

## 2021-11-27 DIAGNOSIS — E119 Type 2 diabetes mellitus without complications: Secondary | ICD-10-CM | POA: Diagnosis not present

## 2021-11-27 DIAGNOSIS — W19XXXA Unspecified fall, initial encounter: Secondary | ICD-10-CM | POA: Insufficient documentation

## 2021-11-27 DIAGNOSIS — Z7982 Long term (current) use of aspirin: Secondary | ICD-10-CM | POA: Diagnosis not present

## 2021-11-27 DIAGNOSIS — Y92129 Unspecified place in nursing home as the place of occurrence of the external cause: Secondary | ICD-10-CM | POA: Diagnosis not present

## 2021-11-27 DIAGNOSIS — R600 Localized edema: Secondary | ICD-10-CM | POA: Diagnosis not present

## 2021-11-27 DIAGNOSIS — Z20822 Contact with and (suspected) exposure to covid-19: Secondary | ICD-10-CM | POA: Diagnosis not present

## 2021-11-27 DIAGNOSIS — S81812A Laceration without foreign body, left lower leg, initial encounter: Secondary | ICD-10-CM | POA: Diagnosis not present

## 2021-11-27 DIAGNOSIS — Z79899 Other long term (current) drug therapy: Secondary | ICD-10-CM | POA: Insufficient documentation

## 2021-11-27 DIAGNOSIS — I10 Essential (primary) hypertension: Secondary | ICD-10-CM | POA: Insufficient documentation

## 2021-11-27 DIAGNOSIS — Z853 Personal history of malignant neoplasm of breast: Secondary | ICD-10-CM | POA: Insufficient documentation

## 2021-11-27 DIAGNOSIS — S8992XA Unspecified injury of left lower leg, initial encounter: Secondary | ICD-10-CM | POA: Diagnosis present

## 2021-11-27 LAB — COMPREHENSIVE METABOLIC PANEL
ALT: 25 U/L (ref 0–44)
AST: 20 U/L (ref 15–41)
Albumin: 3.1 g/dL — ABNORMAL LOW (ref 3.5–5.0)
Alkaline Phosphatase: 48 U/L (ref 38–126)
Anion gap: 11 (ref 5–15)
BUN: 36 mg/dL — ABNORMAL HIGH (ref 8–23)
CO2: 21 mmol/L — ABNORMAL LOW (ref 22–32)
Calcium: 8.5 mg/dL — ABNORMAL LOW (ref 8.9–10.3)
Chloride: 105 mmol/L (ref 98–111)
Creatinine, Ser: 1.01 mg/dL — ABNORMAL HIGH (ref 0.44–1.00)
GFR, Estimated: 59 mL/min — ABNORMAL LOW (ref >60)
Glucose, Bld: 115 mg/dL — ABNORMAL HIGH (ref 70–99)
Potassium: 4 mmol/L (ref 3.5–5.1)
Sodium: 137 mmol/L (ref 135–145)
Total Bilirubin: 0.6 mg/dL (ref 0.3–1.2)
Total Protein: 7 g/dL (ref 6.5–8.1)

## 2021-11-27 LAB — RESP PANEL BY RT-PCR (FLU A&B, COVID) ARPGX2
Influenza A by PCR: NEGATIVE
Influenza B by PCR: NEGATIVE
SARS Coronavirus 2 by RT PCR: NEGATIVE

## 2021-11-27 LAB — CBC WITH DIFFERENTIAL/PLATELET
Abs Immature Granulocytes: 0.1 10*3/uL — ABNORMAL HIGH (ref 0.00–0.07)
Band Neutrophils: 3 %
Basophils Absolute: 0.2 10*3/uL — ABNORMAL HIGH (ref 0.0–0.1)
Basophils Relative: 2 %
Eosinophils Absolute: 0.1 10*3/uL (ref 0.0–0.5)
Eosinophils Relative: 1 %
HCT: 37 % (ref 36.0–46.0)
Hemoglobin: 11.5 g/dL — ABNORMAL LOW (ref 12.0–15.0)
Lymphocytes Relative: 20 %
Lymphs Abs: 2.1 10*3/uL (ref 0.7–4.0)
MCH: 32.3 pg (ref 26.0–34.0)
MCHC: 31.1 g/dL (ref 30.0–36.0)
MCV: 103.9 fL — ABNORMAL HIGH (ref 80.0–100.0)
Monocytes Absolute: 0.8 10*3/uL (ref 0.1–1.0)
Monocytes Relative: 8 %
Myelocytes: 1 %
Neutro Abs: 7.1 10*3/uL (ref 1.7–7.7)
Neutrophils Relative %: 65 %
Platelets: 209 10*3/uL (ref 150–400)
RBC: 3.56 MIL/uL — ABNORMAL LOW (ref 3.87–5.11)
RDW: 14.4 % (ref 11.5–15.5)
WBC: 10.4 10*3/uL (ref 4.0–10.5)
nRBC: 0 % (ref 0.0–0.2)

## 2021-11-27 LAB — BRAIN NATRIURETIC PEPTIDE: B Natriuretic Peptide: 59.9 pg/mL (ref 0.0–100.0)

## 2021-11-27 MED ORDER — BACITRACIN ZINC 500 UNIT/GM EX OINT
1.0000 | TOPICAL_OINTMENT | Freq: Every day | CUTANEOUS | 0 refills | Status: DC
Start: 2021-11-27 — End: 2022-04-21

## 2021-11-27 NOTE — ED Triage Notes (Signed)
Pt BIBA from home. Pt suffered sudden weakness and slid to ground. Pt has  1"x8" apprx skin tear on L lower leg. Pt reports no pain. No LOC.  ? ?No blood thinners. ?Weeping edema on lower extremities.  ?AOX4 ? ?BP: 170/86 ?HR: 76 ?SPO2: 98 RA ?CBG: 148 ?

## 2021-11-27 NOTE — Discharge Instructions (Addendum)
We have placed a home health consult to help with wound care, and assessment for fall risk and help with mobility, and transfers.  Otherwise your lab work today was reassuring.  Try to keep the wound clean and follow wound care instructions.  You should keep it wrapped in a Xeroform or other nonadhering gauze, and monitor for signs of infection including worsening redness, swelling, purulent drainage, pain.  Please return if you have concern for infection.  I am prescribing you some antibiotic ointment that you can apply to the affected area as part of wound care. ?

## 2021-11-27 NOTE — ED Notes (Signed)
Family at the bedside.

## 2021-11-27 NOTE — ED Provider Notes (Addendum)
I provided a substantive portion of the care of this patient.  I personally performed the entirety of the history, exam, and medical decision making for this encounter. ? ?EKG Interpretation ? ?Date/Time:  Friday November 27 2021 20:01:13 EST ?Ventricular Rate:  60 ?PR Interval:    ?QRS Duration: 97 ?QT Interval:  400 ?QTC Calculation: 400 ?R Axis:   58 ?Text Interpretation: Normal sinus rhythm Minimal ST depression, diffuse leads No significant change since last tracing Confirmed by Fredia Sorrow [phone]) on 11/27/2021 9:39:55 PM  ? ? ? ?Patient with a fall at home with a large skin tear to the lateral aspect of her left leg.  X-rays without any acute findings.  Patient has longstanding leg swelling.  This is being followed and worked up by her primary care doctor.  No leukocytosis here hemoglobin is good at 97.5 complete metabolic panel is normal GFR was 59.  COVID influenza is negative BNP is normal at 59.9.  So no evidence of left-sided congestive heart failure.  Chest x-ray shows marked cardiomegaly with mild bilateral by Alena Bills pulmonary vascular prominence could be chronic in nature but no florid pulmonary edema.  And also had abdominal x-rays with a nonobstructive gas pattern. ? ?Patient has a lot of home health devices to include walker lift chair porta potty.  Patient is adamant on not wanting to go to rehab.  Would recommend may be a consult to social services for home health nurse to help with wound care on the leg.  Otherwise she has lots of family members that are willing to help her in the house. ?  ?Fredia Sorrow, MD ?11/27/21 2153 ? ?  ?Fredia Sorrow, MD ?11/27/21 2153 ? ?Addendum: ?Patient son is not comfortable taking her home even though patient wants to go home.  Is an hour back to the game plan of moving her towards rehab or nursing facility.  Consult put in by the physician assistant for case management social worker and physical therapy to evaluate her in the morning.  Patient's  son understands she does not meet criteria for admission to the hospital.  However we did recommend outpatient cardiology evaluation for the bilateral leg swelling to rule out any right sided heart failure.  No evidence of any significant left-sided heart failure on work-up here tonight. ? ?Wound care would keep that dressing in place for 4 days. ? ?Patient will be evaluated by case management social worker and physical therapy in the morning. ? ? ? ?  ?Fredia Sorrow, MD ?11/27/21 2309 ? ?

## 2021-11-27 NOTE — ED Provider Notes (Signed)
[facility] DEPT Provider Note   CSN: [id number] Arrival date & time: 11/27/21  1812     History  Chief Complaint  Patient presents with   Fall    mechanical   Laceration    Tanya Juarez is a 76 y.o. female with past medical history significant for recent colon cancer with colonic resection, lung nodule with lung resection, previous breast cancer, high blood pressure, bilateral leg weakness, diabetes, hyperlipidemia, obesity presents with fall, skin tear.  Patient reports that she did not feel lightheaded, did not hit her head, did not lose consciousness.  She denies any blood thinners.  She only endorses pain in the left leg, and large skin tear.  Patient endorses some occasional numbness and tingling of the distal extremities, however no new changes after fall today.  She denies known cause of fluid overload, heart failure, ascites, or other.  HPI     Home Medications Prior to Admission medications   Medication Sig Start Date End Date Taking? Authorizing Provider  bacitracin ointment Apply 1 application topically daily. 11/27/21  Yes Elza Varricchio H, PA-C  aspirin 81 MG tablet Take 81 mg by mouth daily.    [provider]  carvedilol (COREG) 3.125 MG tablet Take 1 tablet (3.125 mg total) by mouth 2 (two) times daily with a meal. 05/10/19   Magrinat, Virgie Dad, MD  Cholecalciferol (VITAMIN D3) 1000 UNITS CAPS Take 1,000 Units by mouth daily.     [provider]  cloNIDine (CATAPRES) 0.2 MG tablet Take 0.2 mg by mouth 2 (two) times daily.    [provider]  hydrochlorothiazide (HYDRODIURIL) 12.5 MG tablet Take 12.5 mg by mouth daily. 03/26/18   [provider]  pravastatin (PRAVACHOL) 20 MG tablet Take 1 tablet by mouth daily. 03/23/13   [provider]  spironolactone (ALDACTONE) 25 MG tablet Take 1 tablet (25 mg total) by mouth daily. 05/10/19   Magrinat, Virgie Dad, MD  vitamin B-12 (CYANOCOBALAMIN) 1000  MCG tablet Take 1,000 mcg by mouth daily.    [provider]      Allergies    Amlodipine    Review of Systems   Review of Systems  Cardiovascular:  Positive for leg swelling.  Neurological:  Positive for weakness.  All other systems reviewed and are negative.  Physical Exam Updated Vital Signs BP (!) 151/109 (BP Location: Left Arm)    Pulse (!) 57    Temp 98.1 F (36.7 C) (Oral)    Resp 16    Ht 5\' 5"  (1.651 m)    Wt 105.2 kg    SpO2 99%    BMI 38.61 kg/m  Physical Exam Vitals and nursing note reviewed.  Constitutional:      General: She is not in acute distress.    Appearance: Normal appearance. She is obese. She is ill-appearing.  HENT:     Head: Normocephalic and atraumatic.  Eyes:     General:        Right eye: No discharge.        Left eye: No discharge.  Cardiovascular:     Rate and Rhythm: Normal rate and regular rhythm.     Heart sounds: No murmur heard.   No friction rub. No gallop.  Pulmonary:     Effort: Pulmonary effort is normal. No respiratory distress.     Breath sounds: Normal breath sounds. No wheezing or rales.  Abdominal:     General: Bowel sounds are normal.  Palpations: Abdomen is soft.  Skin:    General: Skin is warm and dry.     Capillary Refill: Capillary refill takes less than 2 seconds.     Comments: Significant bruising, swelling throughout extremities.  Small skin tears, wounds throughout, extremely large skin tear approximately 4 inches x 8 inches the lower left extremity with some minimal oozing, no deep laceration noted.  No foreign body noted.  No signs of active infection at this time.  Neurological:     Mental Status: She is alert and oriented to person, place, and time.     Comments: Cranial nerves II through XII grossly intact. Alert and oriented x3.  Moves all 4 limbs spontaneously, normal coordination.  No pronator drift.  Intact strength 4 out of 5 bilateral upper and lower extremities.  General global strength deficit,  no focal abnormalities noted.  Romberg, gait deferred secondary to fall, weakness.    Psychiatric:        Mood and Affect: Mood normal.        Behavior: Behavior normal.    ED Results / Procedures / Treatments   Labs (all labs ordered are listed, but only abnormal results are displayed) Labs Reviewed  CBC WITH DIFFERENTIAL/PLATELET - Abnormal; Notable for the following components:      Result Value   RBC 3.56 (*)    Hemoglobin 11.5 (*)    MCV 103.9 (*)    Basophils Absolute 0.2 (*)    Abs Immature Granulocytes 0.10 (*)    All other components within normal limits  COMPREHENSIVE METABOLIC PANEL - Abnormal; Notable for the following components:   CO2 21 (*)    Glucose, Bld 115 (*)    BUN 36 (*)    Creatinine, Ser 1.01 (*)    Calcium 8.5 (*)    Albumin 3.1 (*)    GFR, Estimated 59 (*)    All other components within normal limits  RESP PANEL BY RT-PCR (FLU A&B, COVID) [id number]  BRAIN NATRIURETIC PEPTIDE    EKG EKG Interpretation  Date/Time:  Friday November 27 2021 20:01:13 EST Ventricular Rate:  60 PR Interval:    QRS Duration: 97 QT Interval:  400 QTC Calculation: 400 R Axis:   58 Text Interpretation: Normal sinus rhythm Minimal ST depression, diffuse leads No significant change since last tracing Confirmed by Fredia Sorrow [phone]) on 11/27/2021 9:39:55 PM  Radiology DG Chest 2 View  Result Date: 11/27/2021 CLINICAL DATA:  Status post fall. EXAM: CHEST - 2 VIEW COMPARISON:  None. FINDINGS: The cardiac silhouette is markedly enlarged. Mild bilateral perihilar pulmonary vascular prominence is noted. Both lungs are otherwise clear. No acute osseous abnormality is identified. IMPRESSION: Marked cardiomegaly with mild bilateral perihilar pulmonary vascular prominence, which may be chronic in nature. Electronically Signed   By: Virgina Norfolk M.D.   On: 11/27/2021 19:24   DG Tibia/Fibula Left  Result Date: 11/27/2021 CLINICAL DATA:  Status post fall. EXAM: LEFT TIBIA AND  FIBULA - 2 VIEW COMPARISON:  None. FINDINGS: There is no evidence of an acute fracture or other focal bone lesions. A radiopaque fixation plate and screws are seen along the left lateral malleolus. There is marked severity vascular calcification. Marked severity soft tissue swelling is seen along the left ankle and visualized portion of the left foot. IMPRESSION: 1. No acute osseous abnormality. 2. Soft tissue swelling which may be, in part, related to the patient's body habitus. 3. Prior open reduction and internal fixation of the left lateral malleolus. Electronically Signed  By: Virgina Norfolk M.D.   On: 11/27/2021 19:28   DG Ankle Complete Left  Result Date: 11/27/2021 CLINICAL DATA:  Status post fall. EXAM: LEFT ANKLE COMPLETE - 3+ VIEW COMPARISON:  None. FINDINGS: There is no evidence of an acute fracture, dislocation, or joint effusion. A radiopaque fixation plate and screws are seen along the left lateral malleolus. A chronic fracture deformity of the left medial malleolus is suspected. There is a large plantar calcaneal spur. Mild to moderate severity degenerative changes are noted along the dorsal aspect of the mid left foot. Marked severity vascular calcification is seen. There is marked severity diffuse soft tissue swelling. IMPRESSION: 1. No acute osseous abnormality. 2. Marked severity diffuse soft tissue swelling which may be, in part, related to the patient's body habitus. 3. Chronic, postoperative and degenerative changes, as described above. 4. Large plantar calcaneal spur. Electronically Signed   By: Virgina Norfolk M.D.   On: 11/27/2021 19:30   DG Abdomen 1 View  Result Date: 11/27/2021 CLINICAL DATA:  Status post fall. EXAM: ABDOMEN - 1 VIEW COMPARISON:  None. FINDINGS: The bowel gas pattern is normal. There is marked severity calcification of the infrarenal abdominal aorta. A nondisplaced seventh right rib fracture of indeterminate age is seen. No radio-opaque calculi or other  significant radiographic abnormality are seen. IMPRESSION: 1. Nonobstructive bowel gas pattern. 2. Nondisplaced seventh right rib fracture of indeterminate age. Correlation with point tenderness is recommended. Electronically Signed   By: Virgina Norfolk M.D.   On: 11/27/2021 19:25   DG Knee Complete 4 Views Left  Result Date: 11/27/2021 CLINICAL DATA:  Status post fall. EXAM: LEFT KNEE - COMPLETE 4+ VIEW COMPARISON:  None. FINDINGS: No evidence of fracture, dislocation, or joint effusion. Moderate to marked severity narrowing of the medial tibiofemoral compartment space is seen. There is marked severity vascular calcification. IMPRESSION: Degenerative changes without evidence of an acute osseous abnormality. Electronically Signed   By: Virgina Norfolk M.D.   On: 11/27/2021 19:27    Procedures Procedures    Medications Ordered in ED Medications - No data to display  ED Course/ Medical Decision Making/ A&P                           Medical Decision Making Amount and/or Complexity of Data Reviewed Labs: ordered. Radiology: ordered.  Risk OTC drugs.   I discussed this case with my attending physician who cosigned this note including patient's presenting symptoms, physical exam, and planned diagnostics and interventions. Attending physician stated agreement with plan or made changes to plan which were implemented.   Attending physician assessed patient at bedside.  This patient presents to the ED for concern of Fall, large skin tear, ongoing bilateral lower extremity edema, this involves an extensive number of treatment options, and is a complaint that carries with it a high risk of complications and morbidity. The emergent differential diagnosis prior to evaluation includes, but is not limited to, acute heart failure, stroke, weakness secondary to edema, kidney failure, liver failure, protein calorie malnutrition, versus other.  This did not an exhaustive differential.  Additional  concern for home health needs as patient lives only with her grandson, and is at risk for further falls..   Past Medical History / Co-morbidities: Diabetes, peripheral neuropathy, B12 deficiency, hypertension, previous cancer of left breast with resection, colon cancer with resection, previous lung surgery  Additional history: Additional history obtained from patient's son, daughter-in-law. External records from outside source obtained  and reviewed including outpatient oncology, GI, family medicine notes, cardiology notes.  Physical Exam: Physical exam performed. The pertinent findings include: Significant at least 3+ bilateral lower extremity edema, large skin tear as noted in physical exam above.  Patient with overall decreased strength, large body habitus.  Lab Tests: I ordered, and personally interpreted labs.  The pertinent results include: Mild anemia, stable compared to previous.  Slight macrocytic quality which is consistent with history of B12 deficiency.  CMP is overall stable compared to previous with slight elevation of BUN at 36.  No evidence of acute liver injury, acute kidney injury at this time.  She has slightly decreased total albumin but not enough that should explain her fluid retention.  BNP at 59.9 with no evidence of left-sided heart failure, still have some concern for possible right-sided heart failure versus venous stasis is a cause for patient's ongoing leg swelling.   Imaging Studies: I ordered imaging studies including chest x-ray, plain films of ankle, tib-fib, knee, and plain film of the abdomen. I independently visualized and interpreted imaging which showed marked cardiomegaly, pulmonary congestion without evidence of pneumonia, or new infiltrate.  No pleural effusion noted.  Likely chronic seventh to rib fracture, as patient has no point tenderness in this location.  No other fracture dislocation noted throughout the left lower extremity. I agree with the radiologist  interpretation.   Cardiac Monitoring:  The patient was maintained on a cardiac monitor.  My attending physician Dr. Rogene Houston viewed and interpreted the cardiac monitored which showed an underlying rhythm of: Diffuse ST depression, but no significant ischemic abnormalities, overall normal sinus rhythm.   Consult placed to help coordinate wound care, home health needs, potential placement in rehabilitation facility as patient feels uncomfortable going home in her current condition.  Disposition: After consideration of the diagnostic results and the patients response to treatment, I feel that patient appears stable from medical perspective for discharge, she has concern for her ongoing leg swelling with no new cause.  Discussed with her that these are chronic changes and can be evaluated on outpatient basis.  Do believe that she needs some home health care for wound care, evaluation of ongoing leg swelling, and mobility concerns to prevent further falls.  She is pending a consult to social work at time of my handoff.  I do of concern for ongoing swelling, overall cardiomegaly, pulmonary congestion, but there seems to be no acute change in her health state today other than her recent fall.  I do believe that she needs to coordinate additional care to evaluate for ongoing cause of her lower extremity edema, but it is unclear whether this needs to happen in an inpatient basis.  She does not meet criteria for admission at this time.  10:52 PM Care of Tretha Sciara transferred to Dr. Rogene Houston at the end of my shift as the patient will require reassessment once labs/imaging have resulted. Patient presentation, ED course, and plan of care discussed with review of all pertinent labs and imaging. Please see his/her note for further details regarding further ED course and disposition. Plan at time of handoff is patient disposition pending social work consult. This may be altered or completely changed at the  discretion of the oncoming team pending results of further workup. .   Final Clinical Impression(s) / ED Diagnoses Final diagnoses:  Fall, initial encounter  Skin tear of left lower leg without complication, initial encounter  Lower extremity edema    Rx / DC Orders ED Discharge  Orders          Ordered    bacitracin ointment  Daily        11/27/21 2158              Dorien Chihuahua 11/27/21 2253    Fredia Sorrow, MD 11/27/21 [phone]

## 2021-11-28 ENCOUNTER — Encounter (HOSPITAL_COMMUNITY): Payer: Self-pay

## 2021-11-28 LAB — CBG MONITORING, ED: Glucose-Capillary: 100 mg/dL — ABNORMAL HIGH (ref 70–99)

## 2021-11-28 MED ORDER — HYDROCHLOROTHIAZIDE 12.5 MG PO TABS
12.5000 mg | ORAL_TABLET | Freq: Every day | ORAL | Status: DC
  Administered 2021-11-28 – 2021-11-30 (×3): 12.5 mg via ORAL
  Filled 2021-11-28 (×3): qty 1

## 2021-11-28 MED ORDER — SPIRONOLACTONE 25 MG PO TABS
25.0000 mg | ORAL_TABLET | Freq: Every day | ORAL | Status: DC
  Administered 2021-11-28 – 2021-11-30 (×3): 25 mg via ORAL
  Filled 2021-11-28 (×3): qty 1

## 2021-11-28 MED ORDER — CEPHALEXIN 500 MG PO CAPS
1000.0000 mg | ORAL_CAPSULE | Freq: Once | ORAL | Status: AC
  Administered 2021-11-28: 1000 mg via ORAL
  Filled 2021-11-28: qty 2

## 2021-11-28 MED ORDER — IRBESARTAN 300 MG PO TABS
300.0000 mg | ORAL_TABLET | Freq: Every day | ORAL | Status: DC
  Administered 2021-11-28 – 2021-11-30 (×3): 300 mg via ORAL
  Filled 2021-11-28 (×3): qty 1

## 2021-11-28 MED ORDER — CEPHALEXIN 500 MG PO CAPS: 500.0000 mg | ORAL_CAPSULE | Freq: Four times a day (QID) | ORAL | 0 refills | Status: DC

## 2021-11-28 MED ORDER — IRBESARTAN-HYDROCHLOROTHIAZIDE 300-12.5 MG PO TABS: 1.0000 | ORAL_TABLET | Freq: Every day | ORAL | Status: DC

## 2021-11-28 MED ORDER — CARVEDILOL 12.5 MG PO TABS
25.0000 mg | ORAL_TABLET | Freq: Two times a day (BID) | ORAL | Status: DC
  Administered 2021-11-28 – 2021-11-30 (×4): 25 mg via ORAL
  Filled 2021-11-28 (×4): qty 2

## 2021-11-28 MED ORDER — PRAVASTATIN SODIUM 20 MG PO TABS
20.0000 mg | ORAL_TABLET | Freq: Every day | ORAL | Status: DC
Start: 2021-11-28 — End: 2021-12-01
  Administered 2021-11-28 – 2021-11-29 (×2): 20 mg via ORAL
  Filled 2021-11-28 (×2): qty 1

## 2021-11-28 MED ORDER — ACETAMINOPHEN 500 MG PO TABS
500.0000 mg | ORAL_TABLET | Freq: Three times a day (TID) | ORAL | Status: DC | PRN
  Administered 2021-11-28: 500 mg via ORAL
  Filled 2021-11-28 (×2): qty 1

## 2021-11-28 MED ORDER — ACETAMINOPHEN 500 MG PO TABS
1000.0000 mg | ORAL_TABLET | Freq: Once | ORAL | Status: AC
  Administered 2021-11-28: 1000 mg via ORAL
  Filled 2021-11-28: qty 2

## 2021-11-28 NOTE — Progress Notes (Signed)
PT consult placed. Pending Home health vs SNF ?

## 2021-11-28 NOTE — Evaluation (Signed)
Physical Therapy Evaluation ?Patient Details ?Name: Tanya Juarez ?MRN: [id number] ?DOB: 07-27-1946 ?Today's Date: 11/28/2021 ? ?History of Present Illness ? Tanya Juarez is a 76 y.o. female with past medical history significant for recent colon cancer with colonic resection, lung nodule with lung resection, previous breast cancer, high blood pressure, bilateral leg weakness, diabetes, hyperlipidemia, obesity present 11/27/21 s with fall, skin tear.  Patient reports that she did not feel lightheaded, did not hit her head, did not lose consciousness  ?Clinical Impression ? The patient is very motivated to mobilize and in trying  to get up.Stated " I don't stay in bed". ?Patient requires mod assist for moving the legs to bed edge and over to prevent any pressure on the  left  lower leg. Mod assist to  place legs onto bed. Dressing in place, appears to be sliding down. ? Patient reports increased throbbing when legs are dependent. Patient stood at [place] and took side steps x 3 up to [facility]. ? At baseline, patient limited ambulator in home with rolllator,independent in ADLs, cooks. Tanya Juarez gets groceries and goes to school so patient home alone. ? Patient  will benefit from SNF for rehab and diligent wound care to ensure recovery to return to modified independence in her home/ ?Pt admitted with above diagnosis.  Pt currently with functional limitations due to the deficits listed below (see PT Problem List). Pt will benefit from skilled PT to increase their independence and safety with mobility to allow discharge to the venue listed below.   ? ?   ? ?Recommendations for follow up therapy are one component of a multi-disciplinary discharge planning process, led by the attending physician.  Recommendations may be updated based on patient status, additional functional criteria and insurance authorization. ? ?Follow Up Recommendations Skilled nursing-short term rehab (<3 hours/day) ? ?  ?Assistance Recommended at Discharge     ?Patient can return home with the following ? A lot of help with walking and/or transfers;A Juarez help with bathing/dressing/bathroom;Help with stairs or ramp for entrance;Assistance with cooking/housework;Assist for transportation ? ?  ?Equipment Recommendations None recommended by PT  ?Recommendations for Other Services ?    ?  ?Functional Status Assessment Patient has had a recent decline in their functional status and demonstrates the ability to make significant improvements in function in a reasonable and predictable amount of time.  ? ?  ?Precautions / Restrictions Precautions ?Precautions: Fall ?Precaution Comments: LLE drssing with large skin tear  ? ?  ? ?Mobility ? Bed Mobility ?Overal bed mobility: Needs Assistance ?Bed Mobility: Supine to Sit, Sit to Supine ?  ?  ?Supine to sit: Mod assist, HOB elevated ?Sit to supine: Mod assist ?  ?General bed mobility comments: assist with LLE to prevent shear on the bed, ptient rocks to advance hips to bed edge, Mod assist to lift both legs back onto the bed. ?  ? ?Transfers ?Overall transfer level: Needs assistance ?Equipment used: Rolling walker (2 wheels) ?Transfers: Sit to/from Stand ?Sit to Stand: Mod assist ?  ?  ?  ?  ?  ?General transfer comment: extra time to ensure LLE does not shear on bed as patient moves forward to stand up. Stands at Rw with min assist. Able  to take small side steps x 3, pt. reports increased pain on the LLE with dependency ?  ? ?Ambulation/Gait ?  ?  ?  ?  ?  ?  ?  ?  ? ?Stairs ?  ?  ?  ?  ?  ? ?  Wheelchair Mobility ?  ? ?Modified Rankin (Stroke Patients Only) ?  ? ?  ? ?Balance Overall balance assessment: Needs assistance ?Sitting-balance support: Feet supported, Bilateral upper extremity supported ?Sitting balance-Leahy Scale: Fair ?  ?  ?Standing balance support: During functional activity, Bilateral upper extremity supported, Reliant on assistive device for balance ?Standing balance-Leahy Scale: Poor ?Standing balance comment:  relies on RW ?  ?  ?  ?  ?  ?  ?  ?  ?  ?  ?  ?   ? ? ? ?Pertinent Vitals/Pain Pain Assessment ?Pain Assessment: Faces ?Faces Pain Scale: Hurts even more ?Pain Location: left leg when dependnent ?Pain Descriptors / Indicators: Discomfort, Pounding, Throbbing ?Pain Intervention(s): Monitored during session, Premedicated before session, Limited activity within patient's tolerance, Repositioned  ? ? ?Home Living Family/patient expects to be discharged to:: Private residence ?Living Arrangements: Alone;Other relatives ?Available Help at Discharge: Family (gransdson lives w/ pt , goes to school) ?Type of Home: House ?Home Access: Ramped entrance ?  ?  ?  ?Home Layout: One level ?Home Equipment: Rollator (4 wheels);BSC/3in1 ?Additional Comments: lift chair  ?  ?Prior Function Prior Level of Function : Independent/Modified Independent ?  ?  ?  ?  ?  ?  ?Mobility Comments: limited amb w/ rollator, transfers to [facility] , holds onto furniture for transfers at night ?ADLs Comments: sponge bathes, cooks but has to guage the activity, sit and rest, uses rollator to transport items through house ?  ? ? ?Hand Dominance  ? Dominant Hand: Right ? ?  ?Extremity/Trunk Assessment  ? Upper Extremity Assessment ?Upper Extremity Assessment: Overall WFL for tasks assessed ?  ? ?Lower Extremity Assessment ?Lower Extremity Assessment: RLE deficits/detail;LLE deficits/detail ?RLE Deficits / Details: able to lift leg from bed ?LLE Deficits / Details: needs support to move leg on bed due to wounds and dressing ?  ? ?Cervical / Trunk Assessment ?Cervical / Trunk Assessment: Normal  ?Communication  ? Communication: No difficulties  ?Cognition Arousal/Alertness: Awake/alert ?Behavior During Therapy: [facility] for tasks assessed/performed ?Overall Cognitive Status: Within Functional Limits for tasks assessed ?  ?  ?  ?  ?  ?  ?  ?  ?  ?  ?  ?  ?  ?  ?  ?  ?  ?  ?  ? ?  ?General Comments   ? ?  ?Exercises    ? ?Assessment/Plan  ?  ?PT Assessment Patient needs  continued PT services  ?PT Problem List Decreased strength;Decreased mobility;Decreased knowledge of precautions;Decreased activity tolerance;Decreased skin integrity;Decreased knowledge of use of DME;Pain ? ?   ?  ?PT Treatment Interventions DME instruction;Therapeutic activities;Gait training;Therapeutic exercise;Patient/family education   ? ?PT Goals (Current goals can be found in the Care Plan section)  ?Acute Rehab PT Goals ?Patient Stated Goal: i want  this leg to heal ?PT Goal Formulation: With patient ?Time For Goal Achievement: 12/12/21 ?Potential to Achieve Goals: Good ? ?  ?Frequency Min 2X/week ?  ? ? ?Co-evaluation   ?  ?  ?  ?  ? ? ?  ?AM-PAC PT "6 Clicks" Mobility  ?Outcome Measure Help needed turning from your back to your side while in a flat bed without using bedrails?: A Lot ?Help needed moving from lying on your back to sitting on the side of a flat bed without using bedrails?: A Lot ?Help needed moving to and from a bed to a chair (including a wheelchair)?: A Lot ?Help needed standing up from a chair using  your arms (e.g., wheelchair or bedside chair)?: A Lot ?Help needed to walk in hospital room?: Total ?Help needed climbing 3-5 steps with a railing? : Total ?6 Click Score: 10 ? ?  ?End of Session Equipment Utilized During Treatment: Gait belt ?Activity Tolerance: Patient limited by pain ?Patient left: in bed;with call bell/phone within reach ?Nurse Communication: Mobility status ?PT Visit Diagnosis: Unsteadiness on feet (R26.81);Difficulty in walking, not elsewhere classified (R26.2);Pain;History of falling (Z91.81) ?Pain - Right/Left: Left ?Pain - part of body: Leg ?  ? ?Time: 2353-6144 ?PT Time Calculation (min) (ACUTE ONLY): 30 min ? ? ?Charges:   PT Evaluation ?$PT Eval Low Complexity: 1 Low ?PT Treatments ?$Therapeutic Activity: 8-22 mins ?  ?   ? ? ?Tresa Endo PT ?Acute Rehabilitation Services ?Pager [phone] ?Office [phone] ? ? ?Claretha Cooper ?11/28/2021, 3:33 PM ?

## 2021-11-28 NOTE — ED Notes (Signed)
PT at bedside.

## 2021-11-28 NOTE — NC FL2 (Signed)
?[place] MEDICAID FL2 LEVEL OF CARE SCREENING TOOL  ?  ? ?IDENTIFICATION  ?Patient Name: ?Tanya Juarez Birthdate: 1946-07-21 Sex: female Admission Date (Current Location): ?11/27/2021  ?[place] and [place] Number: ? [place] ?  Facility and Address:  ?[facility],  [address] [place], [place] ?     Provider Number: ?[id number]  ?Attending Physician Name and Address:  ?Default, Provider, MD ? Relative Name and Phone Number:  ?Marlana Mckowen (Son) [phone] ?   ?Current Level of Care: ?Hospital Recommended Level of Care: ?[place] Prior Approval Number: ?  ? ?Date Approved/Denied: ?  PASRR Number: ?[id number] A ? ?Discharge Plan: ?SNF ?  ? ?Current Diagnoses: ?Patient Active Problem List  ? Diagnosis Date Noted  ? Cancer of central portion of left female breast ([place]) 02/25/2016  ? Open left ankle fracture 08/19/2015  ? Fracture 08/19/2015  ? Gait difficulty 04/13/2013  ? Diabetic neuropathy ([place]) 04/13/2013  ? B12 deficiency 04/13/2013  ? Lumbar radiculopathy 04/13/2013  ? ? ?Orientation RESPIRATION BLADDER Height & Weight   ?  ?Self, Time, Situation, Place ? Normal External catheter, Continent (Patient currently has an external catheter but is usually continent. Cannot get up and move at this time) Weight: 232 lb (105.2 kg) ?Height:  '5\' 5"'$  (165.1 cm)  ?BEHAVIORAL SYMPTOMS/MOOD NEUROLOGICAL BOWEL NUTRITION STATUS  ?    Continent Diet (Normal)  ?AMBULATORY STATUS COMMUNICATION OF NEEDS Skin   ?Extensive Assist Verbally Other (Comment) (Patient has large skin tear from fall) ?  ?  ?  ?    ?     ?     ? ? ?Personal Care Assistance Level of Assistance  ?Bathing, Feeding, Dressing Bathing Assistance: Limited assistance ?Feeding assistance: Independent ?Dressing Assistance: Limited assistance ?   ? ?Functional Limitations Info  ?Sight, Hearing, Speech Sight Info: Adequate ?Hearing Info: Adequate ?Speech Info: Adequate  ? ? ?SPECIAL CARE FACTORS FREQUENCY  ?    ?  ?  ?  ?  ?  ?  ?    ? ? ?Contractures Contractures Info: Not present  ? ? ?Additional Factors Info  ?Allergies, Code Status Code Status Info: Full ?Allergies Info: Amlodipine ?  ?  ?  ?   ? ?Current Medications (11/28/2021):  This is the current hospital active medication list ?Current Facility-Administered Medications  ?Medication Dose Route Frequency Provider Last Rate Last Admin  ? acetaminophen (TYLENOL) tablet 500 mg  500 mg Oral TID PRN Davonna Belling, MD   500 mg at 11/28/21 1430  ? carvedilol (COREG) tablet 25 mg  25 mg Oral BID WC Davonna Belling, MD      ? irbesartan Levy Sjogren) tablet 300 mg  300 mg Oral Daily Davonna Belling, MD   300 mg at 11/28/21 1221  ? And  ? hydrochlorothiazide (HYDRODIURIL) tablet 12.5 mg  12.5 mg Oral Daily Davonna Belling, MD   12.5 mg at 11/28/21 1221  ? pravastatin (PRAVACHOL) tablet 20 mg  20 mg Oral Daily Davonna Belling, MD      ? spironolactone (ALDACTONE) tablet 25 mg  25 mg Oral Daily Davonna Belling, MD   25 mg at 11/28/21 1221  ? ?Current Outpatient Medications  ?Medication Sig Dispense Refill  ? bacitracin ointment Apply 1 application topically daily. 120 g 0  ? carvedilol (COREG) 25 MG tablet Take 25 mg by mouth 2 (two) times daily with a meal.    ? cephALEXin (KEFLEX) 500 MG capsule Take 1 capsule (500 mg total) by mouth 4 (four) times daily.  28 capsule 0  ? irbesartan-hydrochlorothiazide (AVALIDE) 300-12.5 MG tablet Take 1 tablet by mouth daily.    ? pravastatin (PRAVACHOL) 20 MG tablet Take 20 mg by mouth daily.    ? spironolactone (ALDACTONE) 25 MG tablet Take 1 tablet (25 mg total) by mouth daily.    ? ? ? ?Discharge Medications: ?Please see discharge summary for a list of discharge medications. ? ?Relevant Imaging Results: ? ?Relevant Lab Results: ? ? ?Additional Information ?SSN# [id number]. Patient has two covid vaccines ? ?Raina Mina, LCSWA ? ? ? ? ?

## 2021-11-28 NOTE — TOC Initial Note (Signed)
Transition of Care (TOC) - Initial/Assessment Note  ? ? ?Patient Details  ?Name: Tanya Juarez ?MRN: [id number] ?Date of Birth: Aug 10, 1946 ? ?Transition of Care (TOC) CM/SW Contact:    ?Raina Mina, LCSWA ?Phone Number: ?11/28/2021, 4:52 PM ? ?Clinical Narrative:  Patient and family agree with SNF recommendations. Family is not familiar with the SNF's in [place]. SNF bed search started.           ? ? ?Expected Discharge Plan: [place] ?Barriers to Discharge: Continued Medical Work up ? ? ?Patient Goals and CMS Choice ?Patient states their goals for this hospitalization and ongoing recovery are:: Return home or Independent living facility ?  ?  ? ?Expected Discharge Plan and Services ?Expected Discharge Plan: [place] ?  ?  ?  ?Living arrangements for the past 2 months: [place] ?                ?  ?  ?  ?  ?  ?  ?  ?  ?  ?  ? ?Prior Living Arrangements/Services ?Living arrangements for the past 2 months: [place] ?Lives with:: Self ?Patient language and need for interpreter reviewed:: Yes ?Do you feel safe going back to the place where you live?: Yes      ?Need for Family Participation in Patient Care: Yes (Comment) ?Care giver support system in place?: Yes (comment) ?  ?Criminal Activity/Legal Involvement Pertinent to Current Situation/Hospitalization: Yes - Comment as needed ? ?Activities of Daily Living ?  ?  ? ?Permission Sought/Granted ?Permission sought to share information with : Family Supports ?Permission granted to share information with : Yes, Verbal Permission Granted ? Share Information with NAME: Tanya Juarez ?   ? Permission granted to share info w Relationship: Son ? Permission granted to share info w Contact Information: [phone] ? ?Emotional Assessment ?  ?  ?  ?Orientation: : Oriented to Self, Oriented to  Time, Oriented to Place, Oriented to Situation ?Alcohol / Substance Use: Not Applicable ?Psych Involvement: No (comment) ? ?Admission  diagnosis:  Fall  ?Patient Active Problem List  ? Diagnosis Date Noted  ? Cancer of central portion of left female breast ([place]) 02/25/2016  ? Open left ankle fracture 08/19/2015  ? Fracture 08/19/2015  ? Gait difficulty 04/13/2013  ? Diabetic neuropathy ([place]) 04/13/2013  ? B12 deficiency 04/13/2013  ? Lumbar radiculopathy 04/13/2013  ? ?PCP:  Margarito Courser, MD ?Pharmacy:   ?[facility] ([place]), [facility] [place] [address]) [place] [address] ?Phone: [phone] Fax: [phone] ? ? ? ? ?Social Determinants of Health (SDOH) Interventions ?  ? ?Readmission Risk Interventions ?No flowsheet data found. ? ? ?

## 2021-11-29 NOTE — Progress Notes (Signed)
TOC CSW pt currently has no bed offers.  CSW will attempt to contact facilities. ? ?Passenger transport manager, MSW, LCSW-A ?Pronouns:  She/Her/Hers ?[facility] ?Clinical Social Worker ?Direct Number:  [phone] ?[email or web address]  ?

## 2021-11-29 NOTE — ED Notes (Signed)
Dressing changed done. Applied xeroform gauze and abd pads. Will continue to monitor ?

## 2021-11-29 NOTE — ED Provider Notes (Signed)
Emergency Medicine Observation Re-evaluation Note ? ?Tanya Juarez is a 76 y.o. female, seen on rounds today.  Pt initially presented to the ED for complaints of fall, general weakness, and SNF placement. Pt resting comfortably, nad.  ? ?Physical Exam  ?BP 138/79 (BP Location: Left Arm)   Pulse 77   Temp 98.2 ?F (36.8 ?C) (Oral)   Resp 16   Ht 1.651 m ('5\' 5"'$ )   Wt 105.2 kg   SpO2 96%   BMI 38.61 kg/m?  ?Physical Exam ?General: resting. ?Cardiac: regular rate ?Lungs: breathing comfortably ? ? ?ED Course / MDM  ? ? ?I have reviewed the labs performed to date as well as medications administered while in observation.  Recent changes in the last 24 hours include ED obs, TOC placement efforts.  ? ?Plan  ?Current plan is for SNF placement.  ? ? ? ? ?  ?Lajean Saver, MD ?11/29/21 [phone] ? ?

## 2021-11-29 NOTE — ED Notes (Signed)
Patient resting, respirations even and unlabored, side rails up x2, call bell within reach.  In NAD at this time.  ?

## 2021-11-30 NOTE — ED Provider Notes (Signed)
Emergency Medicine Observation Re-evaluation Note ? ?Tanya Juarez is a 76 y.o. female, seen on rounds today at 0700.  Pt initially presented to the ED for complaints of Fall (mechanical) and Laceration ?Currently, the patient is resting comfortably. ? ?Physical Exam  ?BP (!) 154/60 (BP Location: Left Arm)   Pulse 66   Temp (!) 97.5 ?F (36.4 ?C) (Oral)   Resp 18   Ht '5\' 5"'$  (1.651 m)   Wt 105.2 kg   SpO2 98%   BMI 38.61 kg/m?  ?Physical Exam ?General: NAD ? ? ?ED Course / MDM  ?EKG:EKG Interpretation ? ?Date/Time:  Friday November 27 2021 20:01:13 EST ?Ventricular Rate:  60 ?PR Interval:    ?QRS Duration: 97 ?QT Interval:  400 ?QTC Calculation: 400 ?R Axis:   58 ?Text Interpretation: Normal sinus rhythm Minimal ST depression, diffuse leads No significant change since last tracing Confirmed by Fredia Sorrow [phone]) on 11/27/2021 9:39:55 PM ? ?I have reviewed the labs performed to date as well as medications administered while in observation.  Recent changes in the last 24 hours include no acute events reported. ? ?Plan  ?Current plan is for SNF placement. ? Tanya Juarez is not under involuntary commitment. ? ? ?  ?Valarie Merino, MD ?11/30/21 [phone] ? ?

## 2021-11-30 NOTE — Progress Notes (Signed)
Physical Therapy Treatment ?Patient Details ?Name: Tanya Juarez ?MRN: [id number] ?DOB: 1946/02/19 ?Today's Date: 11/30/2021 ? ? ?History of Present Illness Tanya Juarez is a 76 y.o. female with past medical history significant for recent colon cancer with colonic resection, lung nodule with lung resection, previous breast cancer, high blood pressure, bilateral leg weakness, diabetes, hyperlipidemia, obesity present 11/27/21 s with fall, skin tear.  Patient reports that she did not feel lightheaded, did not hit her head, did not lose consciousness ? ?  ?PT Comments  ? ? General Comments: AxO x 3 very pleasant lady.  Now wanting to D/C to home vs SNF. ?Assisted OOB to amb around room.  General bed mobility comments: only required assist to guide L LE off bed and lift back onto bed, otherwise self able. General transfer comment: pt self able to rise from elevated bed as she is use to her lift chair.  Pt able to tolerate FWB L LE .General Gait Details: pt tolerated amb around the room with walker at Supervision level navigating around furniture and completing turns. ?Pt appears at prior level.  She mainly stays in her home.  She has a lift chair, a regular bed, a BSC and several walkers.  She likes to use her Rollator cause she can put stuff on the seat as she moves around room to room.   ?Pt declines SNF and is requesting to go home.  Will update LPT. ?  ?Recommendations for follow up therapy are one component of a multi-disciplinary discharge planning process, led by the attending physician.  Recommendations may be updated based on patient status, additional functional criteria and insurance authorization. ? ?Follow Up Recommendations ? Home health PT ?  ?  ?Assistance Recommended at Discharge PRN  ?Patient can return home with the following A lot of help with walking and/or transfers;A little help with bathing/dressing/bathroom;Help with stairs or ramp for entrance;Assistance with cooking/housework;Assist for  transportation ?  ?Equipment Recommendations ?  (has all equipment already)  ?  ?Recommendations for Other Services   ? ? ?  ?Precautions / Restrictions Precautions ?Precautions: Fall ?Precaution Comments: LLE drssing with large skin tear ?Restrictions ?Weight Bearing Restrictions: No  ?  ? ?Mobility ? Bed Mobility ?Overal bed mobility: Needs Assistance ?Bed Mobility: Supine to Sit, Sit to Supine ?  ?  ?Supine to sit: Min assist ?Sit to supine: Min assist ?  ?General bed mobility comments: only required assist to guide L LE off bed and lift back onto bed, otherwise self able. ?  ? ?Transfers ?Overall transfer level: Needs assistance ?Equipment used: Rolling walker (2 wheels) ?Transfers: Sit to/from Stand ?Sit to Stand: Supervision, Min guard ?  ?  ?  ?  ?  ?General transfer comment: pt self able to rise from elevated bed as she is use to her lift chair.  Pt able to tolerate FWB L LE . ?  ? ?Ambulation/Gait ?Ambulation/Gait assistance: Supervision, Min guard ?Gait Distance (Feet): 18 Feet ?Assistive device: Rolling walker (2 wheels) ?Gait Pattern/deviations: Step-to pattern, Decreased step length - right, Decreased step length - left, Wide base of support ?Gait velocity: decreased ?  ?  ?General Gait Details: pt tolerated amb around the room with walker at Supervision level navigating around furniture and completing turns. ? ? ?Stairs ?  ?  ?  ?  ?  ? ? ?Wheelchair Mobility ?  ? ?Modified Rankin (Stroke Patients Only) ?  ? ? ?  ?Balance   ?  ?  ?  ?  ?  ?  ?  ?  ?  ?  ?  ?  ?  ?  ?  ?  ?  ?  ?  ? ?  ?  Cognition Arousal/Alertness: Awake/alert ?Behavior During Therapy: [facility] for tasks assessed/performed ?Overall Cognitive Status: Within Functional Limits for tasks assessed ?  ?  ?  ?  ?  ?  ?  ?  ?  ?  ?  ?  ?  ?  ?  ?  ?General Comments: AxO x 3 very pleasant lady.  Now wanting to D/C to home vs SNF. ?  ?  ? ?  ?Exercises   ? ?  ?General Comments   ?  ?  ? ?Pertinent Vitals/Pain Pain Assessment ?Pain Assessment:  Faces ?Faces Pain Scale: Hurts a little bit ?Pain Location: left leg when dependnent ?Pain Descriptors / Indicators: Discomfort, Pounding, Throbbing ?Pain Intervention(s): Monitored during session, Repositioned  ? ? ?Home Living   ?  ?  ?  ?  ?  ?  ?  ?  ?  ?   ?  ?Prior Function    ?  ?  ?   ? ?PT Goals (current goals can now be found in the care plan section) Progress towards PT goals: Progressing toward goals ? ?  ?Frequency ? ? ?   ? ? ? ?  ?PT Plan Current plan remains appropriate  ? ? ?Co-evaluation   ?  ?  ?  ?  ? ?  ?AM-PAC PT "6 Clicks" Mobility   ?Outcome Measure ? Help needed turning from your back to your side while in a flat bed without using bedrails?: A Little ?Help needed moving from lying on your back to sitting on the side of a flat bed without using bedrails?: A Little ?Help needed moving to and from a bed to a chair (including a wheelchair)?: A Little ?Help needed standing up from a chair using your arms (e.g., wheelchair or bedside chair)?: None ?Help needed to walk in hospital room?: None ?Help needed climbing 3-5 steps with a railing? : A Little ?6 Click Score: 20 ? ?  ?End of Session Equipment Utilized During Treatment: Gait belt ?Activity Tolerance: Patient tolerated treatment well ?Patient left: in bed;with call bell/phone within reach ?Nurse Communication: Mobility status ?PT Visit Diagnosis: Unsteadiness on feet (R26.81);Difficulty in walking, not elsewhere classified (R26.2);Pain;History of falling (Z91.81) ?Pain - Right/Left: Left ?Pain - part of body: Leg ?  ? ? ?Time: 0347-4259 ?PT Time Calculation (min) (ACUTE ONLY): 19 min ? ?Charges:  $Gait Training: 8-22 mins          ?          ? ?Rica Koyanagi  PTA ?Acute  Rehabilitation Services ?Pager      [phone] ?Office      [phone] ? ? ?

## 2021-11-30 NOTE — Progress Notes (Addendum)
RNCM received multiple declines for [facility] services due to staffing and insurance. RNCM spoke with patient's son to advise no [place] agency accepting the patient. This RNCM will schedule an appointment with [facility]. RNCM advised son that after speaking with his mother she was in agreement and requesting scheduling an appointment anyday between 10a-11am. Son is in agreement. Patient's son advised the family declined the SNF offer due to the rating. Patient's son provided this RNCM his wife's contact information Tereasa Yilmaz [phone].  ? ? ?RNCM called [facility] however reached a voicemail that they are closed for the weekend. This RNCM will attempt to call to schedule the appointment. RNCM will provide [facility] woundcare information on AVS.  ? ?RNCM called patient's daughter in law Sharel Behne [phone] to advise of  woundcare appointment, received no answer. ? ?

## 2021-11-30 NOTE — Progress Notes (Signed)
Pt's Auth was approved, spoke with kelly from [facility] , she is requesting AVS , will send room assuagement after. ? ? ?11:30 am  ?CSW spoke with pt she stated she wants to d/C home and no longer wants to go to [facility]. CSW informed pt she will attempted to arrange [facility] services . CSW informed pt this will not delay pt's D/C. TOC to follow. ? ?Arlie Solomons.Venicia Vandall, MSW, [place] ?[place]  Transitions of Care ?Clinical Social Worker I ?Direct Dial: [phone]  Fax: [phone] ?[email or web address]  ?    ?

## 2021-11-30 NOTE — TOC Progression Note (Addendum)
Transition of Care (TOC) - Progression Note  ? ? ?Patient Details  ?Name: Shanieka Blea ?MRN: [id number] ?Date of Birth: 10/14/1945 ? ?Transition of Care (TOC) CM/SW Contact  ?Roseanne Kaufman, RN ?Phone Number: ?11/30/2021, 4:47 PM ? ?Clinical Narrative:    ?RNCM spoke with patient and her daughter in law regarding d/c planning. RNCM advised of [place] declines and outpatient woundcare appointment. RNCM encouraged daughter in law to call patient's PCP for follow up care. Patient's daughter in law stated patient sees providers in the [facility] system and will attempt to get a wound care appointment in the near future. Daughter in law questioning if patient was ready for discharge, RNCM advised to speak with bedside RN to get all d/c information.  ?RNCM notified bedside RN to provide dressing until family is able to get to the pharmacy to purchase dressing supplies.  ?No additional TOC needs at this time.  ? ? ?Expected Discharge Plan: [place] ?Barriers to Discharge: Continued Medical Work up ? ?Expected Discharge Plan and Services ?Expected Discharge Plan: [place]----- Patient will discharge home with outpatient wound care services.  ?  ? Living arrangements for the past 2 months: [place] ?                ? ?Social Determinants of Health (SDOH) Interventions ?  ? ?Readmission Risk Interventions ?No flowsheet data found. ? ?

## 2021-11-30 NOTE — TOC Progression Note (Addendum)
Transition of Care (TOC) - Progression Note  ? ? ?Patient Details  ?Name: Tanya Juarez ?MRN: [id number] ?Date of Birth: [birth year] ? ?Transition of Care (TOC) CM/SW Contact  ?Roseanne Kaufman, RN ?Phone Number: ?11/30/2021, 1:39 PM ? ?Clinical Narrative:    ?RNCM spoke with patient at bedside to discuss discharge planning. Patient was offered SNF placement, however has declined wants to go home with [facility]. RNCM advised awaiting HHC to accept patient. RNCM discussed possible plan if HHC does not accept patient. Patient has family support, daughter in law works from home and takes her to all of her MD appointments. If no HHC is able to accept patient RNCM will schedule outpatient appointment to [facility]. HHC acceptance will not delay discharge. ?RNCM notified bedside RN re: teaching patient how to change dressing. Patient is eager to learn how to change and feels she can change at home.   ? ?TOC will continue following.   ? ? ?Expected Discharge Plan: [place]- has changed to [facility] ?Barriers to Discharge: Continued Medical Work up ? ?Expected Discharge Plan and Services ?Expected Discharge Plan: [place] ? Patient has declined SNF placement and requesting [facility] services.  ?  ?  ?Living arrangements for the past 2 months: [place] ?                ?  ?Social Determinants of Health (SDOH) Interventions ?  ? ?Readmission Risk Interventions ?No flowsheet data found. ? ?

## 2021-11-30 NOTE — Progress Notes (Signed)
CSW presented bed offer to pt , pt accepted [facility] offer. CSW to submit prior auth. TOC to follow.  ?Tanya Juarez.Tanya Juarez, MSW, [place] ?[place]  Transitions of Care ?Clinical Social Worker I ?Direct Dial: [phone]  Fax: [phone] ?[email or web address]  ?

## 2021-12-14 ENCOUNTER — Encounter (HOSPITAL_BASED_OUTPATIENT_CLINIC_OR_DEPARTMENT_OTHER): Payer: Medicare Other | Admitting: General Surgery

## 2022-04-16 ENCOUNTER — Encounter (HOSPITAL_BASED_OUTPATIENT_CLINIC_OR_DEPARTMENT_OTHER): Payer: Self-pay | Admitting: Emergency Medicine

## 2022-04-16 ENCOUNTER — Other Ambulatory Visit: Payer: Self-pay

## 2022-04-16 ENCOUNTER — Inpatient Hospital Stay (HOSPITAL_BASED_OUTPATIENT_CLINIC_OR_DEPARTMENT_OTHER)
Admission: EM | Admit: 2022-04-16 | Discharge: 2022-04-21 | DRG: 603 | Disposition: A | Discharge status: Skilled Nursing Facility | Payer: Medicare Other | Attending: Internal Medicine | Admitting: Internal Medicine

## 2022-04-16 DIAGNOSIS — Z79899 Other long term (current) drug therapy: Secondary | ICD-10-CM

## 2022-04-16 DIAGNOSIS — Z6841 Body Mass Index (BMI) 40.0 and over, adult: Secondary | ICD-10-CM

## 2022-04-16 DIAGNOSIS — E119 Type 2 diabetes mellitus without complications: Secondary | ICD-10-CM

## 2022-04-16 DIAGNOSIS — Z8249 Family history of ischemic heart disease and other diseases of the circulatory system: Secondary | ICD-10-CM

## 2022-04-16 DIAGNOSIS — R531 Weakness: Secondary | ICD-10-CM | POA: Diagnosis present

## 2022-04-16 DIAGNOSIS — L0390 Cellulitis, unspecified: Secondary | ICD-10-CM | POA: Diagnosis present

## 2022-04-16 DIAGNOSIS — E785 Hyperlipidemia, unspecified: Secondary | ICD-10-CM | POA: Diagnosis present

## 2022-04-16 DIAGNOSIS — W1839XA Other fall on same level, initial encounter: Secondary | ICD-10-CM | POA: Diagnosis present

## 2022-04-16 DIAGNOSIS — R609 Edema, unspecified: Secondary | ICD-10-CM

## 2022-04-16 DIAGNOSIS — Z888 Allergy status to other drugs, medicaments and biological substances status: Secondary | ICD-10-CM

## 2022-04-16 DIAGNOSIS — Y9301 Activity, walking, marching and hiking: Secondary | ICD-10-CM | POA: Diagnosis present

## 2022-04-16 DIAGNOSIS — M7989 Other specified soft tissue disorders: Secondary | ICD-10-CM | POA: Diagnosis not present

## 2022-04-16 DIAGNOSIS — C50112 Malignant neoplasm of central portion of left female breast: Secondary | ICD-10-CM | POA: Diagnosis present

## 2022-04-16 DIAGNOSIS — Y92008 Other place in unspecified non-institutional (private) residence as the place of occurrence of the external cause: Secondary | ICD-10-CM

## 2022-04-16 DIAGNOSIS — E1142 Type 2 diabetes mellitus with diabetic polyneuropathy: Secondary | ICD-10-CM | POA: Diagnosis present

## 2022-04-16 DIAGNOSIS — L03116 Cellulitis of left lower limb: Principal | ICD-10-CM

## 2022-04-16 DIAGNOSIS — Z9049 Acquired absence of other specified parts of digestive tract: Secondary | ICD-10-CM

## 2022-04-16 DIAGNOSIS — Z85038 Personal history of other malignant neoplasm of large intestine: Secondary | ICD-10-CM

## 2022-04-16 DIAGNOSIS — E876 Hypokalemia: Secondary | ICD-10-CM | POA: Diagnosis present

## 2022-04-16 DIAGNOSIS — Z853 Personal history of malignant neoplasm of breast: Secondary | ICD-10-CM

## 2022-04-16 DIAGNOSIS — I10 Essential (primary) hypertension: Secondary | ICD-10-CM | POA: Diagnosis present

## 2022-04-16 DIAGNOSIS — T148XXA Other injury of unspecified body region, initial encounter: Secondary | ICD-10-CM

## 2022-04-16 MED ORDER — VANCOMYCIN HCL IN DEXTROSE 1-5 GM/200ML-% IV SOLN
1000.0000 mg | Freq: Once | INTRAVENOUS | Status: AC
  Administered 2022-04-17: 1000 mg via INTRAVENOUS
  Filled 2022-04-16: qty 200

## 2022-04-16 MED ORDER — SODIUM CHLORIDE 0.9 % IV SOLN
2.0000 g | Freq: Once | INTRAVENOUS | Status: AC
  Administered 2022-04-16: 2 g via INTRAVENOUS
  Filled 2022-04-16: qty 12.5

## 2022-04-16 NOTE — ED Provider Notes (Incomplete)
[facility]ERG[facility] DEPT Provider Note   CSN: [id number] Arrival date & time: 04/16/22  1903     History {Add pertinent medical, surgical, social history, OB history to HPI:1} Chief Complaint  Patient presents with  . Leg Injury    Tanya Juarez is a 76 y.o. female.  The history is provided by the patient and medical records. No language interpreter was used.  Leg Pain Location:  Leg Time since incident:  1 week Injury: yes   Mechanism of injury: fall   Pain details:    Quality:  Aching   Radiates to:  L leg      Home Medications Prior to Admission medications   Medication Sig Start Date End Date Taking? Authorizing Provider  bacitracin ointment Apply 1 application topically daily. 11/27/21   Prosperi, Christian H, PA-C  carvedilol (COREG) 25 MG tablet Take 25 mg by mouth 2 (two) times daily with a meal. 05/10/19   Magrinat, Virgie Dad, MD  cephALEXin (KEFLEX) 500 MG capsule Take 1 capsule (500 mg total) by mouth 4 (four) times daily. 11/28/21   Molpus, John, MD  irbesartan-hydrochlorothiazide (AVALIDE) 300-12.5 MG tablet Take 1 tablet by mouth daily. 09/02/21   [provider]  pravastatin (PRAVACHOL) 20 MG tablet Take 20 mg by mouth daily. 03/23/13   [provider]  spironolactone (ALDACTONE) 25 MG tablet Take 1 tablet (25 mg total) by mouth daily. 05/10/19   Magrinat, Virgie Dad, MD      Allergies    Amlodipine    Review of Systems   Review of Systems  Physical Exam Updated Vital Signs BP (!) 197/81   Pulse 62   Temp 98.3 F (36.8 C)   Resp 18   SpO2 100%  Physical Exam  ED Results / Procedures / Treatments   Labs (all labs ordered are listed, but only abnormal results are displayed) Labs Reviewed  CULTURE, BLOOD (ROUTINE X 2)  CULTURE, BLOOD (ROUTINE X 2)  CBC WITH DIFFERENTIAL/PLATELET  COMPREHENSIVE METABOLIC PANEL  LACTIC ACID, PLASMA  LACTIC ACID, PLASMA  PROTIME-INR    EKG None  Radiology No results  found.  Procedures Procedures  {Document cardiac monitor, telemetry assessment procedure when appropriate:1}  Medications Ordered in ED Medications  vancomycin (VANCOCIN) IVPB 1000 mg/200 mL premix (has no administration in time range)  ceFEPIme (MAXIPIME) 2 g in sodium chloride 0.9 % 100 mL IVPB (2 g Intravenous New Bag/Given 04/16/22 2345)    ED Course/ Medical Decision Making/ A&P                           Medical Decision Making Amount and/or Complexity of Data Reviewed Labs: ordered. Radiology: ordered.  Risk Prescription drug management.    Tanya Juarez is a 76 y.o. female with a past medical history significant for hypertension, hyperlipidemia, diabetes, and chronic leg wounds who presents with leg injury and pain.  According to patient and family, she has been managing a lateral left leg wound for several months and has chronic swelling of the legs.  She reports that about a week and a half ago she had a fall and injured her left medial lower leg and had bruising and swelling that has been progressing and worsening over the last few days.  There is now developing erythema redness and warmth going up her left medial leg.  She showed images to her wound team who saw her and feel she needs evaluation and likely admission for possible  infection and wound care.  She otherwise has sensation that is unchanged from her baseline but is having moderate to severe pain.  She denies any other injuries and no headache, neck pain, chest pain, or abdominal or back pain.  No injury to the right leg reported.  On exam, patient has intact sensation and strength in legs.  She does have bruising and swelling that is fluctuant in her medial right lower leg near the calf.  It is spongy but I do not appreciate crepitance.  There is significant tenderness and then more proximally there is some erythema and redness going up the leg.  Clinically the appearance is somewhat concerning for a Sherry Ruffing  type lesion from a internal degloving injury.  She does not take blood thinners but I do feel she needs imaging to rule out a deep hematoma or other significant injury.  She is afebrile but I am concerned about infection a sending upper leg with the redness and possible cellulitis.  We do not have ultrasound here at this time but have less suspicion for DVT based on the more focal symptoms in the leg.  We will get the CT scan and labs and we will give her antibiotics as I am concerned about rapidly worsening infection.  Care transferred to oncoming team to await results of CT imaging and labs and she will need admission for cellulitis worsening of the leg after being sent in by the wound team.  Anticipate admission after work-up is completed.  {Document critical care time when appropriate:1} {Document review of labs and clinical decision tools ie heart score, Chads2Vasc2 etc:1}  {Document your independent review of radiology images, and any outside records:1} {Document your discussion with family members, caretakers, and with consultants:1} {Document social determinants of health affecting pt's care:1} {Document your decision making why or why not admission, treatments were needed:1} Final Clinical Impression(s) / ED Diagnoses Final diagnoses:  Cellulitis of left lower extremity  Bruising  Swelling    Clinical Impression: 1. Cellulitis of left lower extremity   2. Bruising   3. Swelling     Disposition: Care transferred to oncoming team to await work-up completion prior to likely admission for cellulitis of the leg with possible other traumatic injuries awaiting CT results.  This note was prepared with assistance of Systems analyst. Occasional wrong-word or sound-a-like substitutions may have occurred due to the inherent limitations of voice recognition software.

## 2022-04-16 NOTE — ED Triage Notes (Signed)
Leg wound, receives wound care originally from fall in march. Fell 2 weeks ago, now leg has more swelling and discolored, was advise by wound care to go to ED if increased swelling.

## 2022-04-16 NOTE — ED Provider Notes (Signed)
  [facility] DEPT Provider Note   CSN: [id number] Arrival date & time: 04/16/22  1903     History {Add pertinent medical, surgical, social history, OB history to HPI:1} Chief Complaint  Patient presents with  . Leg Injury    Tanya Juarez is a 76 y.o. female.  HPI     Home Medications Prior to Admission medications   Medication Sig Start Date End Date Taking? Authorizing Provider  bacitracin ointment Apply 1 application topically daily. 11/27/21   Prosperi, Christian H, PA-C  carvedilol (COREG) 25 MG tablet Take 25 mg by mouth 2 (two) times daily with a meal. 05/10/19   Magrinat, Virgie Dad, MD  cephALEXin (KEFLEX) 500 MG capsule Take 1 capsule (500 mg total) by mouth 4 (four) times daily. 11/28/21   Molpus, John, MD  irbesartan-hydrochlorothiazide (AVALIDE) 300-12.5 MG tablet Take 1 tablet by mouth daily. 09/02/21   [provider]  pravastatin (PRAVACHOL) 20 MG tablet Take 20 mg by mouth daily. 03/23/13   [provider]  spironolactone (ALDACTONE) 25 MG tablet Take 1 tablet (25 mg total) by mouth daily. 05/10/19   Magrinat, Virgie Dad, MD      Allergies    Amlodipine    Review of Systems   Review of Systems  Physical Exam Updated Vital Signs BP (!) 197/81   Pulse 62   Temp 98.3 F (36.8 C)   Resp 18   SpO2 100%  Physical Exam  ED Results / Procedures / Treatments   Labs (all labs ordered are listed, but only abnormal results are displayed) Labs Reviewed - No data to display  EKG None  Radiology No results found.  Procedures Procedures  {Document cardiac monitor, telemetry assessment procedure when appropriate:1}  Medications Ordered in ED Medications - No data to display  ED Course/ Medical Decision Making/ A&P                           Medical Decision Making  ***  {Document critical care time when appropriate:1} {Document review of labs and clinical decision tools ie heart score, Chads2Vasc2 etc:1}   {Document your independent review of radiology images, and any outside records:1} {Document your discussion with family members, caretakers, and with consultants:1} {Document social determinants of health affecting pt's care:1} {Document your decision making why or why not admission, treatments were needed:1} Final Clinical Impression(s) / ED Diagnoses Final diagnoses:  None    Rx / DC Orders ED Discharge Orders     None

## 2022-04-17 ENCOUNTER — Encounter (HOSPITAL_COMMUNITY): Payer: Self-pay | Admitting: Internal Medicine

## 2022-04-17 ENCOUNTER — Emergency Department (HOSPITAL_BASED_OUTPATIENT_CLINIC_OR_DEPARTMENT_OTHER): Payer: Medicare Other

## 2022-04-17 DIAGNOSIS — Z8249 Family history of ischemic heart disease and other diseases of the circulatory system: Secondary | ICD-10-CM | POA: Diagnosis not present

## 2022-04-17 DIAGNOSIS — W1839XA Other fall on same level, initial encounter: Secondary | ICD-10-CM | POA: Diagnosis present

## 2022-04-17 DIAGNOSIS — Z853 Personal history of malignant neoplasm of breast: Secondary | ICD-10-CM | POA: Diagnosis not present

## 2022-04-17 DIAGNOSIS — Z79899 Other long term (current) drug therapy: Secondary | ICD-10-CM | POA: Diagnosis not present

## 2022-04-17 DIAGNOSIS — E785 Hyperlipidemia, unspecified: Secondary | ICD-10-CM | POA: Diagnosis present

## 2022-04-17 DIAGNOSIS — E119 Type 2 diabetes mellitus without complications: Secondary | ICD-10-CM

## 2022-04-17 DIAGNOSIS — Z888 Allergy status to other drugs, medicaments and biological substances status: Secondary | ICD-10-CM | POA: Diagnosis not present

## 2022-04-17 DIAGNOSIS — I10 Essential (primary) hypertension: Secondary | ICD-10-CM

## 2022-04-17 DIAGNOSIS — L03116 Cellulitis of left lower limb: Secondary | ICD-10-CM

## 2022-04-17 DIAGNOSIS — M7989 Other specified soft tissue disorders: Secondary | ICD-10-CM | POA: Diagnosis present

## 2022-04-17 DIAGNOSIS — Y9301 Activity, walking, marching and hiking: Secondary | ICD-10-CM | POA: Diagnosis present

## 2022-04-17 DIAGNOSIS — Z6841 Body Mass Index (BMI) 40.0 and over, adult: Secondary | ICD-10-CM | POA: Diagnosis not present

## 2022-04-17 DIAGNOSIS — E66813 Obesity, class 3: Secondary | ICD-10-CM

## 2022-04-17 DIAGNOSIS — E1142 Type 2 diabetes mellitus with diabetic polyneuropathy: Secondary | ICD-10-CM | POA: Diagnosis present

## 2022-04-17 DIAGNOSIS — Y92008 Other place in unspecified non-institutional (private) residence as the place of occurrence of the external cause: Secondary | ICD-10-CM | POA: Diagnosis not present

## 2022-04-17 DIAGNOSIS — Z85038 Personal history of other malignant neoplasm of large intestine: Secondary | ICD-10-CM | POA: Diagnosis not present

## 2022-04-17 DIAGNOSIS — L0390 Cellulitis, unspecified: Secondary | ICD-10-CM | POA: Diagnosis present

## 2022-04-17 DIAGNOSIS — E876 Hypokalemia: Secondary | ICD-10-CM

## 2022-04-17 DIAGNOSIS — Z9049 Acquired absence of other specified parts of digestive tract: Secondary | ICD-10-CM | POA: Diagnosis not present

## 2022-04-17 DIAGNOSIS — R531 Weakness: Secondary | ICD-10-CM | POA: Diagnosis present

## 2022-04-17 LAB — HEMOGLOBIN A1C
Hgb A1c MFr Bld: 6.5 % — ABNORMAL HIGH (ref 4.8–5.6)
Mean Plasma Glucose: 139.85 mg/dL

## 2022-04-17 LAB — COMPREHENSIVE METABOLIC PANEL
ALT: 13 U/L (ref 0–44)
AST: 8 U/L — ABNORMAL LOW (ref 15–41)
Albumin: 3.4 g/dL — ABNORMAL LOW (ref 3.5–5.0)
Alkaline Phosphatase: 50 U/L (ref 38–126)
Anion gap: 10 (ref 5–15)
BUN: 24 mg/dL — ABNORMAL HIGH (ref 8–23)
CO2: 26 mmol/L (ref 22–32)
Calcium: 9.1 mg/dL (ref 8.9–10.3)
Chloride: 107 mmol/L (ref 98–111)
Creatinine, Ser: 0.8 mg/dL (ref 0.44–1.00)
GFR, Estimated: >60 mL/min (ref >60)
Glucose, Bld: 120 mg/dL — ABNORMAL HIGH (ref 70–99)
Potassium: 3 mmol/L — ABNORMAL LOW (ref 3.5–5.1)
Sodium: 143 mmol/L (ref 135–145)
Total Bilirubin: 0.9 mg/dL (ref 0.3–1.2)
Total Protein: 6.3 g/dL — ABNORMAL LOW (ref 6.5–8.1)

## 2022-04-17 LAB — CBC
HCT: 37.9 % (ref 36.0–46.0)
Hemoglobin: 11.6 g/dL — ABNORMAL LOW (ref 12.0–15.0)
MCH: 30.7 pg (ref 26.0–34.0)
MCHC: 30.6 g/dL (ref 30.0–36.0)
MCV: 100.3 fL — ABNORMAL HIGH (ref 80.0–100.0)
Platelets: 193 10*3/uL (ref 150–400)
RBC: 3.78 MIL/uL — ABNORMAL LOW (ref 3.87–5.11)
RDW: 14.6 % (ref 11.5–15.5)
WBC: 12 10*3/uL — ABNORMAL HIGH (ref 4.0–10.5)
nRBC: 0 % (ref 0.0–0.2)

## 2022-04-17 LAB — CBC WITH DIFFERENTIAL/PLATELET
Abs Immature Granulocytes: 0.68 10*3/uL — ABNORMAL HIGH (ref 0.00–0.07)
Basophils Absolute: 0.1 10*3/uL (ref 0.0–0.1)
Basophils Relative: 1 %
Eosinophils Absolute: 0 10*3/uL (ref 0.0–0.5)
Eosinophils Relative: 0 %
HCT: 38.1 % (ref 36.0–46.0)
Hemoglobin: 12.2 g/dL (ref 12.0–15.0)
Immature Granulocytes: 5 %
Lymphocytes Relative: 6 %
Lymphs Abs: 0.7 10*3/uL (ref 0.7–4.0)
MCH: 31.3 pg (ref 26.0–34.0)
MCHC: 32 g/dL (ref 30.0–36.0)
MCV: 97.7 fL (ref 80.0–100.0)
Monocytes Absolute: 0.5 10*3/uL (ref 0.1–1.0)
Monocytes Relative: 4 %
Neutro Abs: 10.5 10*3/uL — ABNORMAL HIGH (ref 1.7–7.7)
Neutrophils Relative %: 84 %
Platelets: 210 10*3/uL (ref 150–400)
RBC: 3.9 MIL/uL (ref 3.87–5.11)
RDW: 14.8 % (ref 11.5–15.5)
WBC: 12.5 10*3/uL — ABNORMAL HIGH (ref 4.0–10.5)
nRBC: 0 % (ref 0.0–0.2)

## 2022-04-17 LAB — PROTIME-INR
INR: 1.1 (ref 0.8–1.2)
Prothrombin Time: 13.8 seconds (ref 11.4–15.2)

## 2022-04-17 LAB — GLUCOSE, CAPILLARY
Glucose-Capillary: 110 mg/dL — ABNORMAL HIGH (ref 70–99)
Glucose-Capillary: 90 mg/dL (ref 70–99)
Glucose-Capillary: 135 mg/dL — ABNORMAL HIGH (ref 70–99)

## 2022-04-17 LAB — CREATININE, SERUM
Creatinine, Ser: 0.77 mg/dL (ref 0.44–1.00)
GFR, Estimated: >60 mL/min (ref >60)

## 2022-04-17 LAB — LACTIC ACID, PLASMA: Lactic Acid, Venous: 1.1 mmol/L (ref 0.5–1.9)

## 2022-04-17 LAB — MAGNESIUM: Magnesium: 2 mg/dL (ref 1.7–2.4)

## 2022-04-17 MED ORDER — ONDANSETRON HCL 4 MG/2ML IJ SOLN: 4.0000 mg | Freq: Four times a day (QID) | INTRAMUSCULAR | Status: DC | PRN

## 2022-04-17 MED ORDER — HYDROCHLOROTHIAZIDE 12.5 MG PO TABS
12.5000 mg | ORAL_TABLET | Freq: Every day | ORAL | Status: DC
  Administered 2022-04-17 – 2022-04-21 (×5): 12.5 mg via ORAL
  Filled 2022-04-17 (×5): qty 1

## 2022-04-17 MED ORDER — HYDRALAZINE HCL 20 MG/ML IJ SOLN
10.0000 mg | Freq: Three times a day (TID) | INTRAMUSCULAR | Status: DC | PRN
  Administered 2022-04-19 – 2022-04-20 (×3): 10 mg via INTRAVENOUS
  Filled 2022-04-17 (×3): qty 1

## 2022-04-17 MED ORDER — CEFAZOLIN SODIUM-DEXTROSE 2-4 GM/100ML-% IV SOLN
2.0000 g | Freq: Three times a day (TID) | INTRAVENOUS | Status: DC
  Administered 2022-04-17 – 2022-04-21 (×13): 2 g via INTRAVENOUS
  Filled 2022-04-17 (×13): qty 100

## 2022-04-17 MED ORDER — IRBESARTAN-HYDROCHLOROTHIAZIDE 300-12.5 MG PO TABS: 1.0000 | ORAL_TABLET | Freq: Every day | ORAL | Status: DC

## 2022-04-17 MED ORDER — POTASSIUM CHLORIDE CRYS ER 20 MEQ PO TBCR
40.0000 meq | EXTENDED_RELEASE_TABLET | Freq: Two times a day (BID) | ORAL | Status: AC
  Administered 2022-04-17 – 2022-04-18 (×3): 40 meq via ORAL
  Filled 2022-04-17 (×3): qty 2

## 2022-04-17 MED ORDER — ENOXAPARIN SODIUM 40 MG/0.4ML IJ SOSY
40.0000 mg | PREFILLED_SYRINGE | INTRAMUSCULAR | Status: DC
  Administered 2022-04-17 – 2022-04-20 (×4): 40 mg via SUBCUTANEOUS
  Filled 2022-04-17 (×3): qty 0.4

## 2022-04-17 MED ORDER — IRBESARTAN 300 MG PO TABS
300.0000 mg | ORAL_TABLET | Freq: Every day | ORAL | Status: DC
  Administered 2022-04-17 – 2022-04-21 (×5): 300 mg via ORAL
  Filled 2022-04-17 (×5): qty 1

## 2022-04-17 MED ORDER — IOHEXOL 300 MG/ML  SOLN
100.0000 mL | Freq: Once | INTRAMUSCULAR | Status: AC | PRN
  Administered 2022-04-17: 100 mL via INTRAVENOUS

## 2022-04-17 MED ORDER — ACETAMINOPHEN 650 MG RE SUPP: 650.0000 mg | Freq: Four times a day (QID) | RECTAL | Status: DC | PRN

## 2022-04-17 MED ORDER — SPIRONOLACTONE 25 MG PO TABS
25.0000 mg | ORAL_TABLET | Freq: Every day | ORAL | Status: DC
  Administered 2022-04-17 – 2022-04-21 (×5): 25 mg via ORAL
  Filled 2022-04-17 (×5): qty 1

## 2022-04-17 MED ORDER — ACETAMINOPHEN 325 MG PO TABS: 650.0000 mg | ORAL_TABLET | Freq: Four times a day (QID) | ORAL | Status: DC | PRN

## 2022-04-17 MED ORDER — PRAVASTATIN SODIUM 20 MG PO TABS
20.0000 mg | ORAL_TABLET | Freq: Every day | ORAL | Status: DC
  Administered 2022-04-17 – 2022-04-20 (×4): 20 mg via ORAL
  Filled 2022-04-17 (×4): qty 1

## 2022-04-17 MED ORDER — ONDANSETRON HCL 4 MG PO TABS: 4.0000 mg | ORAL_TABLET | Freq: Four times a day (QID) | ORAL | Status: DC | PRN

## 2022-04-17 MED ORDER — INSULIN ASPART 100 UNIT/ML IJ SOLN
0.0000 [IU] | Freq: Three times a day (TID) | INTRAMUSCULAR | Status: DC
  Administered 2022-04-18 – 2022-04-21 (×8): 1 [IU] via SUBCUTANEOUS

## 2022-04-17 MED ORDER — FERROUS SULFATE 325 (65 FE) MG PO TABS
325.0000 mg | ORAL_TABLET | Freq: Every day | ORAL | Status: DC
  Administered 2022-04-17 – 2022-04-20 (×4): 325 mg via ORAL
  Filled 2022-04-17 (×4): qty 1

## 2022-04-17 MED ORDER — HYDROCODONE-ACETAMINOPHEN 5-325 MG PO TABS: 1.0000 | ORAL_TABLET | ORAL | Status: DC | PRN

## 2022-04-17 NOTE — ED Notes (Signed)
ED TO INPATIENT HANDOFF REPORT  ED Nurse Name and Phone #:   S Name/Age/Gender Tanya Juarez 76 y.o. female Room/Bed: DB013/DB013  Code Status   Code Status: Prior  Home/SNF/Other Home Patient oriented to: self, place, time, and situation Is this baseline? Yes      Chief Complaint Cellulitis [E08.14]  Triage Note Leg wound, receives wound care originally from fall in march. Fell 2 weeks ago, now leg has more swelling and discolored, was advise by wound care to go to ED if increased swelling.   Allergies Allergies  Allergen Reactions   Amlodipine Swelling    LE edema    Level of Care/Admitting Diagnosis ED Disposition     ED Disposition  Admit   Condition  --   Comment  Hospital Area: [facility] [[id number]]  Level of Care: Telemetry [5]  Admit to tele based on following criteria: Monitor QTC interval  Interfacility transfer: Yes  May place patient in observation at [facility] or [facility] if equivalent level of care is available:: No  Covid Evaluation: Asymptomatic - no recent exposure (last 10 days) testing not required  Diagnosis: Cellulitis [314970]  Admitting Physician: Rise Patience [phone]  Attending Physician: Rise Patience [phone]          B Medical/Surgery History Past Medical History:  Diagnosis Date   Cancer of left breast ([place])    Diabetic peripheral neuropathy ([place])    Full dentures    Hyperlipidemia    Hypertension    Neuropathy    Type II diabetes mellitus ([place])    Weakness of both legs    Past Surgical History:  Procedure Laterality Date   APPENDECTOMY  1971   BREAST BIOPSY Left 01/2016   BREAST LUMPECTOMY WITH RADIOACTIVE SEED AND SENTINEL LYMPH NODE BIOPSY Left 04/22/2016   Procedure: LEFT BREAST LUMPECTOMY WITH RADIOACTIVE SEED AND SENTINEL LYMPH NODE BIOPSY;  Surgeon: Alphonsa Overall, MD;  Location: [facility];  Service: General;  Laterality: Left;   BREAST REDUCTION SURGERY Bilateral 04/22/2016    Procedure: BILATERAL IMMEDIATE MAMMARY REDUCTION  (BREAST);  Surgeon: Loel Lofty Dillingham, DO;  Location: [facility];  Service: Plastics;  Laterality: Bilateral;   CESAREAN SECTION     1971   CESAREAN SECTION WITH BILATERAL TUBAL LIGATION  1973   FRACTURE SURGERY     I & D EXTREMITY Left 08/19/2015   Procedure: IRRIGATION AND DEBRIDEMENT LEFT ANKLE FRACTURE;  Surgeon: Leandrew Koyanagi, MD;  Location: [facility];  Service: Orthopedics;  Laterality: Left;   MASTECTOMY COMPLETE / SIMPLE W/ SENTINEL NODE BIOPSY Left 04/22/2016   WITH RADIOACTIVE SEED    ORIF ANKLE FRACTURE Left 08/20/2015   Procedure: OPEN REDUCTION INTERNAL FIXATION (ORIF) LEFT ANKLE FRACTURE, SYNDESMOSIS INJURY;  Surgeon: Leandrew Koyanagi, MD;  Location: [facility];  Service: Orthopedics;  Laterality: Left;   REDUCTION MAMMAPLASTY Bilateral 04/22/2016     A IV Location/Drains/Wounds Patient Lines/Drains/Airways Status     Active Line/Drains/Airways     Name Placement date Placement time Site Days   Peripheral IV 04/16/22 22 G Left Hand 04/16/22  2319  Hand  1   Peripheral IV 04/16/22 20 G Right Antecubital 04/16/22  2334  Antecubital  1   Peripheral IV 04/16/22 22 G 1" Posterior;Right Hand 04/16/22  2340  Hand  1   External Urinary Catheter 04/17/22  0209  --  less than 1   Wound / Incision (Open or Dehisced) 11/30/21 Skin tear Pretibial Left per pt she fell and  cut her leg open 11/30/21  1200  Pretibial  138            Intake/Output Last 24 hours No intake or output data in the 24 hours ending 04/17/22 0502  Labs/Imaging Results for orders placed or performed during the hospital encounter of 04/16/22 (from the past 48 hour(s))  CBC with Differential     Status: Abnormal   Collection Time: 04/16/22 11:36 PM  Result Value Ref Range   WBC 12.5 (H) 4.0 - 10.5 K/uL   RBC 3.90 3.87 - 5.11 MIL/uL   Hemoglobin 12.2 12.0 - 15.0 g/dL   HCT 38.1 36.0 - 46.0 %   MCV 97.7 80.0 - 100.0 fL   MCH 31.3 26.0 - 34.0 pg   MCHC 32.0 30.0 - 36.0  g/dL   RDW 14.8 11.5 - 15.5 %   Platelets 210 150 - 400 K/uL    Comment: REPEATED TO VERIFY   nRBC 0.0 0.0 - 0.2 %   Neutrophils Relative % 84 %   Neutro Abs 10.5 (H) 1.7 - 7.7 K/uL   Lymphocytes Relative 6 %   Lymphs Abs 0.7 0.7 - 4.0 K/uL   Monocytes Relative 4 %   Monocytes Absolute 0.5 0.1 - 1.0 K/uL   Eosinophils Relative 0 %   Eosinophils Absolute 0.0 0.0 - 0.5 K/uL   Basophils Relative 1 %   Basophils Absolute 0.1 0.0 - 0.1 K/uL   Immature Granulocytes 5 %   Abs Immature Granulocytes 0.68 (H) 0.00 - 0.07 K/uL    Comment: Performed at [facility], [place], [place] [address]  Comprehensive metabolic panel     Status: Abnormal   Collection Time: 04/16/22 11:36 PM  Result Value Ref Range   Sodium 143 135 - 145 mmol/L   Potassium 3.0 (L) 3.5 - 5.1 mmol/L   Chloride 107 98 - 111 mmol/L   CO2 26 22 - 32 mmol/L   Glucose, Bld 120 (H) 70 - 99 mg/dL    Comment: Glucose reference range applies only to samples taken after fasting for at least 8 hours.   BUN 24 (H) 8 - 23 mg/dL   Creatinine, Ser 0.80 0.44 - 1.00 mg/dL   Calcium 9.1 8.9 - 10.3 mg/dL   Total Protein 6.3 (L) 6.5 - 8.1 g/dL   Albumin 3.4 (L) 3.5 - 5.0 g/dL   AST 8 (L) 15 - 41 U/L   ALT 13 0 - 44 U/L   Alkaline Phosphatase 50 38 - 126 U/L   Total Bilirubin 0.9 0.3 - 1.2 mg/dL   GFR, Estimated >60 >60 mL/min    Comment: (NOTE) Calculated using the CKD-EPI Creatinine Equation (2021)    Anion gap 10 5 - 15    Comment: Performed at [facility], [place], [place] [address]  Lactic acid, plasma     Status: None   Collection Time: 04/16/22 11:36 PM  Result Value Ref Range   Lactic Acid, Venous 1.1 0.5 - 1.9 mmol/L    Comment: Performed at [facility], [address], [place], [place] [address]  Protime-INR     Status: None   Collection Time: 04/16/22 11:36 PM  Result Value Ref Range   Prothrombin Time 13.8 11.4 - 15.2 seconds    INR 1.1 0.8 - 1.2    Comment: (NOTE) INR goal varies based on device and disease states. Performed at [facility], [address] [place], [place] [address]  CT EXTREMITY LOWER LEFT W CONTRAST  Result Date: 04/17/2022 CLINICAL DATA:  Soft tissue infection suspected, lower leg. Concern for [employer] injury. EXAM: CT OF THE LOWER LEFT EXTREMITY WITH CONTRAST TECHNIQUE: Multidetector CT imaging of the lower left extremity was performed according to the standard protocol following intravenous contrast administration. RADIATION DOSE REDUCTION: This exam was performed according to the departmental dose-optimization program which includes automated exposure control, adjustment of the mA and/or kV according to patient size and/or use of iterative reconstruction technique. CONTRAST:  161m OMNIPAQUE IOHEXOL 300 MG/ML  SOLN COMPARISON:  None Available. FINDINGS: Bones/Joint/Cartilage No acute fracture or dislocation. Fixation hardware is noted at the distal fibula. Mild-to-moderate degenerative changes are noted in the medial and patellofemoral compartments at the knee. Calcaneal spurring is noted. Multiple bony densities are seen inferior to the medial malleolus, possible old avulsion fractures. A small joint effusion is noted at the knee. Ligaments Suboptimally assessed by CT. Muscles and Tendons Diffuse muscular atrophy is noted. No intramuscular hematoma. The quadriceps and patellar tendons are intact. Soft tissues There is diffuse subcutaneous fat stranding and edema about the right lower extremity. Scattered irregular hyperdense foci are noted in the subcutaneous soft tissues along the posteromedial aspect of the calf suggesting small multifocal hematomas. No abnormal enhancement is seen. No encapsulated fluid collection is seen. Vascular calcifications are present in the soft tissues. IMPRESSION: 1. Diffuse subcutaneous edema and fat stranding in the right lower extremity. No  encapsulated fluid collection or abnormal enhancement is seen. 2. Scattered hyperdense regions in the subcutaneous tissues overlying the posteromedial calf, possible small hemorrhages. 3. No acute osseous abnormality. Electronically Signed   By: LBrett FairyM.D.   On: 04/17/2022 01:44    Pending Labs Unresulted Labs (From admission, onward)     Start     Ordered   04/16/22 2232  Blood culture (routine x 2)  BLOOD CULTURE X 2,   R (with STAT occurrences)      04/16/22 2233            Vitals/Pain Today's Vitals   04/17/22 0200 04/17/22 0300 04/17/22 0400 04/17/22 0500  BP: (!) 180/63 (!) 179/63 (!) 184/72 (!) 180/80  Pulse: (!) 54 (!) 48 (!) 58 (!) 41  Resp: '12 14 17 13  '$ Temp:      TempSrc:      SpO2: 98% 99% 100% 99%  PainSc:        Isolation Precautions No active isolations  Medications Medications  vancomycin (VANCOCIN) IVPB 1000 mg/200 mL premix (0 mg Intravenous Stopped 04/17/22 0121)  ceFEPIme (MAXIPIME) 2 g in sodium chloride 0.9 % 100 mL IVPB (0 g Intravenous Stopped 04/17/22 0014)  iohexol (OMNIPAQUE) 300 MG/ML solution 100 mL (100 mLs Intravenous Contrast Given 04/17/22 0107)    Mobility walks with device     Focused Assessments    R Recommendations: See Admitting Provider Note  Report given to:   Additional Notes:

## 2022-04-17 NOTE — Plan of Care (Signed)
  Problem: Education: Goal: Knowledge of General Education information will improve Description Including pain rating scale, medication(s)/side effects and non-pharmacologic comfort measures Outcome: Progressing   Problem: Health Behavior/Discharge Planning: Goal: Ability to manage health-related needs will improve Outcome: Progressing   

## 2022-04-17 NOTE — Evaluation (Signed)
Physical Therapy Evaluation Patient Details Name: Tanya Juarez MRN: [id number] DOB: September 16, 1946 Today's Date: 04/17/2022  History of Present Illness  76 y.o. female with medical history significant of HTN, HLD, DM2, morbid obesity, breast CA/colon CA.  Pt admitted for left leg swelling and cellulitis after she stated she fell at home 2 weeks ago and injured her left leg.  Clinical Impression  Pt admitted with above diagnosis.  Pt currently with functional limitations due to the deficits listed below (see PT Problem List). Pt will benefit from skilled PT to increase their independence and safety with mobility to allow discharge to the venue listed below.  Pt reports declining mobility after the last few months and now dependent on lift chair to assist with standing.  Pt also with frequent falls and lives alone.  Pt very pleasant and agreeable to work with physical therapy in hopes of improving strength and independence as she reports she depends on family to assist her at home right now due to challenges of finding in home care. Pt aware due to slowly declining mobility and strength, she may be slower to progress.  Recommend SNF upon d/c.          Recommendations for follow up therapy are one component of a multi-disciplinary discharge planning process, led by the attending physician.  Recommendations may be updated based on patient status, additional functional criteria and insurance authorization.  Follow Up Recommendations Skilled nursing-short term rehab (<3 hours/day) Can patient physically be transported by private vehicle: No    Assistance Recommended at Discharge Frequent or constant Supervision/Assistance  Patient can return home with the following  Two people to help with walking and/or transfers;A lot of help with bathing/dressing/bathroom;Assist for transportation;Assistance with cooking/housework    Equipment Recommendations None recommended by PT  Recommendations for Other  Services       Functional Status Assessment Patient has had a recent decline in their functional status and demonstrates the ability to make significant improvements in function in a reasonable and predictable amount of time.     Precautions / Restrictions Precautions Precautions: Fall      Mobility  Bed Mobility Overal bed mobility: Needs Assistance Bed Mobility: Supine to Sit, Sit to Supine     Supine to sit: Max assist, +2 for physical assistance, HOB elevated Sit to supine: Total assist, +2 for physical assistance   General bed mobility comments: pt attempting to self assist LEs; requires assist for LEs at baseline    Transfers Overall transfer level: Needs assistance Equipment used: Rolling walker (2 wheels) Transfers: Sit to/from Stand Sit to Stand: Total assist           General transfer comment: attempted standing with RW multiple times from different surface levels and pt unable; provided stedy and elevated bed and pt able to stand with mod assist and hold approx 1 min and then 2 min with seated rest break; pt unable to lateral scoot in bed    Ambulation/Gait                  Stairs            Wheelchair Mobility    Modified Rankin (Stroke Patients Only)       Balance Overall balance assessment: Needs assistance, History of Falls Sitting-balance support: No upper extremity supported, Feet supported Sitting balance-Leahy Scale: Fair  Pertinent Vitals/Pain Pain Assessment Pain Assessment: Faces Faces Pain Scale: Hurts even more Pain Location: LEs with touch/assist Pain Descriptors / Indicators: Sore, Tender Pain Intervention(s): Repositioned, Monitored during session    Home Living Family/patient expects to be discharged to:: Private residence Living Arrangements: Alone;Other relatives Available Help at Discharge: Family Type of Home: House Home Access: Ramped entrance        Home Layout: One level Home Equipment: Rollator (4 wheels);BSC/3in1 Additional Comments: lift chair    Prior Function Prior Level of Function : Independent/Modified Independent             Mobility Comments: limited amb w/ rollator, reports recently getting a hospital bed, uses lift chair for easier transfer, falls frequently and needs family assist to get off floor ADLs Comments: pt reports she performs ADLs however son states poor hygiene habits at this time due to immobility     Hand Dominance        Extremity/Trunk Assessment        Lower Extremity Assessment Lower Extremity Assessment: Generalized weakness    Cervical / Trunk Assessment Cervical / Trunk Assessment: Other exceptions Cervical / Trunk Exceptions: increased body habitus with muscular atrophy of extremities  Communication   Communication: No difficulties  Cognition Arousal/Alertness: Awake/alert Behavior During Therapy: WFL for tasks assessed/performed Overall Cognitive Status: Within Functional Limits for tasks assessed                                          General Comments      Exercises     Assessment/Plan    PT Assessment Patient needs continued PT services  PT Problem List Decreased strength;Obesity;Decreased mobility;Decreased activity tolerance;Decreased balance;Decreased knowledge of use of DME       PT Treatment Interventions DME instruction;Gait training;Balance training;Therapeutic exercise;Wheelchair mobility training;Functional mobility training;Therapeutic activities;Patient/family education    PT Goals (Current goals can be found in the Care Plan section)  Acute Rehab PT Goals PT Goal Formulation: With patient/family Time For Goal Achievement: 05/01/22 Potential to Achieve Goals: Good    Frequency Min 2X/week     Co-evaluation               AM-PAC PT "6 Clicks" Mobility  Outcome Measure Help needed turning from your back to your side while  in a flat bed without using bedrails?: A Lot Help needed moving from lying on your back to sitting on the side of a flat bed without using bedrails?: Total Help needed moving to and from a bed to a chair (including a wheelchair)?: Total Help needed standing up from a chair using your arms (e.g., wheelchair or bedside chair)?: Total Help needed to walk in hospital room?: Total Help needed climbing 3-5 steps with a railing? : Total 6 Click Score: 7    End of Session Equipment Utilized During Treatment: Gait belt Activity Tolerance: Patient tolerated treatment well Patient left: in bed;with call bell/phone within reach;with bed alarm set;with family/visitor present Nurse Communication: Mobility status PT Visit Diagnosis: Muscle weakness (generalized) (M62.81);Adult, failure to thrive (R62.7);History of falling (Z91.81)    Time: 3762-8315 PT Time Calculation (min) (ACUTE ONLY): 39 min   Charges:   PT Evaluation $PT Eval Moderate Complexity: 1 Mod PT Treatments $Therapeutic Activity: 8-22 mins      Jannette Spanner PT, DPT Physical Therapist Acute Rehabilitation Services Preferred contact method: Secure Chat Weekend Pager Only: [phone] Office: [phone]  Kati L Payson 04/17/2022, 3:26 PM

## 2022-04-17 NOTE — H&P (Addendum)
History and Physical    Patient: Tanya Juarez WEX:[id number] DOB: [birth year] DOA: 04/16/2022 DOS: the patient was seen and examined on 04/17/2022 PCP: Margarito Courser, MD  Patient coming from: Home  Chief Complaint:  Chief Complaint  Patient presents with   Leg Injury   HPI: Tanya Juarez is a 76 y.o. female with medical history significant of HTN, HLD, DM2, morbid obesity, breast CA/colon CA. Presenting with left leg swelling. She fell at home 2 weeks ago and injured her left leg. She says she was walking between rooms in her house and her legs just gave out. She was already following with a wound care specialist for another leg wound. They examined her and gave her instructions for care. She says she was doing fine until the last week or so. She says it seems more red and swollen. Her family sent pictures to the wound car doctor 2 days ago and they recommended that she go to the ED for evaluation. She denies any other aggravating or alleviating factors.    Review of Systems: As mentioned in the history of present illness. All other systems reviewed and are negative. Past Medical History:  Diagnosis Date   Cancer of left breast ([place])    Diabetic peripheral neuropathy (H[facility])    Full dentures    Hyperlipidemia    Hypertension    Neuropathy    Type II diabetes mellitus ([place])    Weakness of both legs    Past Surgical History:  Procedure Laterality Date   APPENDECTOMY  1971   BREAST BIOPSY Left 01/2016   BREAST LUMPECTOMY WITH RADIOACTIVE SEED AND SENTINEL LYMPH NODE BIOPSY Left 04/22/2016   Procedure: LEFT BREAST LUMPECTOMY WITH RADIOACTIVE SEED AND SENTINEL LYMPH NODE BIOPSY;  Surgeon: Alphonsa Overall, MD;  Location: [facility];  Service: General;  Laterality: Left;   BREAST REDUCTION SURGERY Bilateral 04/22/2016   Procedure: BILATERAL IMMEDIATE MAMMARY REDUCTION  (BREAST);  Surgeon: Loel Lofty Dillingham, DO;  Location: [facility];  Service: Plastics;  Laterality: Bilateral;   CESAREAN SECTION      1971   CESAREAN SECTION WITH BILATERAL TUBAL LIGATION  1973   FRACTURE SURGERY     I & D EXTREMITY Left 08/19/2015   Procedure: IRRIGATION AND DEBRIDEMENT LEFT ANKLE FRACTURE;  Surgeon: Leandrew Koyanagi, MD;  Location: [facility];  Service: Orthopedics;  Laterality: Left;   MASTECTOMY COMPLETE / SIMPLE W/ SENTINEL NODE BIOPSY Left 04/22/2016   WITH RADIOACTIVE SEED    ORIF ANKLE FRACTURE Left 08/20/2015   Procedure: OPEN REDUCTION INTERNAL FIXATION (ORIF) LEFT ANKLE FRACTURE, SYNDESMOSIS INJURY;  Surgeon: Leandrew Koyanagi, MD;  Location: [facility];  Service: Orthopedics;  Laterality: Left;   REDUCTION MAMMAPLASTY Bilateral 04/22/2016   Social History:  reports that she has never smoked. She has never used smokeless tobacco. She reports current alcohol use of about 2.0 standard drinks of alcohol per week. She reports that she does not use drugs.  Allergies  Allergen Reactions   Amlodipine Swelling    LE edema    Family History  Problem Relation Age of Onset   Liver cancer Mother    Lung cancer Father    Hypertension Other     Prior to Admission medications   Medication Sig Start Date End Date Taking? Authorizing Provider  bacitracin ointment Apply 1 application topically daily. 11/27/21   Prosperi, Christian H, PA-C  carvedilol (COREG) 25 MG tablet Take 25 mg by mouth 2 (two) times daily with a meal. 05/10/19   Magrinat, Sarajane Jews  C, MD  cephALEXin (KEFLEX) 500 MG capsule Take 1 capsule (500 mg total) by mouth 4 (four) times daily. 11/28/21   Molpus, John, MD  irbesartan-hydrochlorothiazide (AVALIDE) 300-12.5 MG tablet Take 1 tablet by mouth daily. 09/02/21   [provider]  pravastatin (PRAVACHOL) 20 MG tablet Take 20 mg by mouth daily. 03/23/13   [provider]  spironolactone (ALDACTONE) 25 MG tablet Take 1 tablet (25 mg total) by mouth daily. 05/10/19   Magrinat, Virgie Dad, MD    Physical Exam: Vitals:   04/17/22 0400 04/17/22 0500 04/17/22 0502 04/17/22 0649  BP: (!) 184/72 (!)  180/80  (!) 169/79  Pulse: (!) 58 (!) 41 (!) 54 (!) 57  Resp: '17 13 17 20  ' Temp:    97.9 F (36.6 C)  TempSrc:      SpO2: 100% 99% 100% 100%  Weight:    109.4 kg  Height:    '5\' 5"'  (1.651 m)   General: 76 y.o. female resting in bed in NAD Eyes: PERRL, normal sclera ENMT: Nares patent w/o discharge, orophaynx clear, dentition normal, ears w/o discharge/lesions/ulcers Neck: Supple, trachea midline Cardiovascular: RRR, +S1, S2, no m/g/r, equal pulses throughout Respiratory: CTABL, no w/r/r, normal WOB GI: BS+, obese, soft, NT, no masses noted, no organomegaly noted MSK: No c/c; chronic venous changes of BLE; LLE also w/ increased erythema and surface varicose veins non-tender to palpitation - her legs are soft with intact sensation, intact pulses distally Neuro: A&O x 3, no focal deficits Psyc: Appropriate interaction and affect, calm/cooperative  Data Reviewed:  Lab Results  Component Value Date   NA 143 04/16/2022   K 3.0 (L) 04/16/2022   CO2 26 04/16/2022   GLUCOSE 120 (H) 04/16/2022   BUN 24 (H) 04/16/2022   CREATININE 0.80 04/16/2022   CALCIUM 9.1 04/16/2022   EGFR 70 (L) 03/03/2016   GFRNONAA >60 04/16/2022   Lab Results  Component Value Date   WBC 12.5 (H) 04/16/2022   HGB 12.2 04/16/2022   HCT 38.1 04/16/2022   MCV 97.7 04/16/2022   PLT 210 04/16/2022   CT LLE 1. Diffuse subcutaneous edema and fat stranding in the right lower extremity. No encapsulated fluid collection or abnormal enhancement is seen. 2. Scattered hyperdense regions in the subcutaneous tissues overlying the posteromedial calf, possible small hemorrhages. 3. No acute osseous abnormality  Assessment and Plan: LLE Cellulitis     - admit to inpt, tele     - started on vanc and cefepime; not sure that we really need PSA coverage here; will continue abx on ancef     - follow Bld Cx     - spoke with general surgery about wound care/wrapping this leg; rec'd kerlix w/ top layer ace bandage for  compressive support for now w/ elevation of leg; appreciate assistance  DM2     - not on meds at home, last A1c in Care Everywhere on 12/10/21 shows A1c of 6.7     - rpt A1c     - SSI, glucose checks, DM diet  HTN     - continue home regimen  Hx of colon CA Hx of Breast CA     - continue outpt follow up  Morbid obesity     - counsel on diet/lifestyle changes     - continue outpt follow up  HLD     - continue statin  Hypokalemia     - replace K+, check Mg2+  Advance Care Planning:   Code Status: FULL  Consults: None  Family Communication: w/ sister by phone  Severity of Illness: The appropriate patient status for this patient is INPATIENT. Inpatient status is judged to be reasonable and necessary in order to provide the required intensity of service to ensure the patient's safety. The patient's presenting symptoms, physical exam findings, and initial radiographic and laboratory data in the context of their chronic comorbidities is felt to place them at high risk for further clinical deterioration. Furthermore, it is not anticipated that the patient will be medically stable for discharge from the hospital within 2 midnights of admission.   * I certify that at the point of admission it is my clinical judgment that the patient will require inpatient hospital care spanning beyond 2 midnights from the point of admission due to high intensity of service, high risk for further deterioration and high frequency of surveillance required.*  Author: Jonnie Finner, DO 04/17/2022 7:20 AM  For on call review [email or web address].

## 2022-04-18 ENCOUNTER — Inpatient Hospital Stay (HOSPITAL_COMMUNITY): Payer: Medicare Other

## 2022-04-18 DIAGNOSIS — M7989 Other specified soft tissue disorders: Secondary | ICD-10-CM

## 2022-04-18 DIAGNOSIS — L03116 Cellulitis of left lower limb: Secondary | ICD-10-CM | POA: Diagnosis not present

## 2022-04-18 LAB — COMPREHENSIVE METABOLIC PANEL
ALT: 13 U/L (ref 0–44)
AST: 10 U/L — ABNORMAL LOW (ref 15–41)
Albumin: 2.1 g/dL — ABNORMAL LOW (ref 3.5–5.0)
Alkaline Phosphatase: 42 U/L (ref 38–126)
Anion gap: 5 (ref 5–15)
BUN: 22 mg/dL (ref 8–23)
CO2: 26 mmol/L (ref 22–32)
Calcium: 7.9 mg/dL — ABNORMAL LOW (ref 8.9–10.3)
Chloride: 111 mmol/L (ref 98–111)
Creatinine, Ser: 0.73 mg/dL (ref 0.44–1.00)
GFR, Estimated: >60 mL/min (ref >60)
Glucose, Bld: 137 mg/dL — ABNORMAL HIGH (ref 70–99)
Potassium: 3.7 mmol/L (ref 3.5–5.1)
Sodium: 142 mmol/L (ref 135–145)
Total Bilirubin: 0.6 mg/dL (ref 0.3–1.2)
Total Protein: 4.9 g/dL — ABNORMAL LOW (ref 6.5–8.1)

## 2022-04-18 LAB — CBC
HCT: 31.7 % — ABNORMAL LOW (ref 36.0–46.0)
Hemoglobin: 9.9 g/dL — ABNORMAL LOW (ref 12.0–15.0)
MCH: 30.8 pg (ref 26.0–34.0)
MCHC: 31.2 g/dL (ref 30.0–36.0)
MCV: 98.8 fL (ref 80.0–100.0)
Platelets: 177 10*3/uL (ref 150–400)
RBC: 3.21 MIL/uL — ABNORMAL LOW (ref 3.87–5.11)
RDW: 14.6 % (ref 11.5–15.5)
WBC: 11 10*3/uL — ABNORMAL HIGH (ref 4.0–10.5)
nRBC: 0 % (ref 0.0–0.2)

## 2022-04-18 LAB — GLUCOSE, CAPILLARY
Glucose-Capillary: 122 mg/dL — ABNORMAL HIGH (ref 70–99)
Glucose-Capillary: 131 mg/dL — ABNORMAL HIGH (ref 70–99)
Glucose-Capillary: 147 mg/dL — ABNORMAL HIGH (ref 70–99)
Glucose-Capillary: 176 mg/dL — ABNORMAL HIGH (ref 70–99)

## 2022-04-18 NOTE — Plan of Care (Signed)
  Problem: Education: Goal: Knowledge of General Education information will improve Description Including pain rating scale, medication(s)/side effects and non-pharmacologic comfort measures Outcome: Progressing   Problem: Health Behavior/Discharge Planning: Goal: Ability to manage health-related needs will improve Outcome: Progressing   

## 2022-04-18 NOTE — TOC Initial Note (Signed)
Transition of Care [facility]) - Initial/Assessment Note    Patient Details  Name: Tanya Juarez MRN: [id number] Date of Birth: [birth year]  [facility]) CM/SW Contact:    Ross Ludwig, LCSW Phone Number: 04/18/2022, 1:22 PM  Clinical Narrative:                  Patient is a 76 year old female who is alert and oriented x4.  CSW spoke to patient's son who inquired about rehab options for patient.  CSW informed him of the process to find rehab for patient.  Per patient's son she has been to [facility] in the past, and would like to know what other facilities can accept patient.  CSW informed him about the process for looking for SNF placement and insurance authorization.  Patient's son would prefer [facility], and does not want her to go to [facility].  CSW was given permission to begin bed search in [facility].  Expected Discharge Plan: Skilled Nursing Facility Barriers to Discharge: Continued Medical Work up, Ship broker   Patient Goals and CMS Choice Patient states their goals for this hospitalization and ongoing recovery are:: To go to SNF for rehab, then return back home. CMS Medicare.gov Compare Post Acute Care list provided to:: Patient Represenative (must comment) Choice offered to / list presented to : Adult Children  Expected Discharge Plan and Services Expected Discharge Plan: [place]                                              Prior Living Arrangements/Services   Lives with:: Adult Children Patient language and need for interpreter reviewed:: Yes Do you feel safe going back to the place where you live?: No   Patient and family feel she needs short term rehab before returning back home.  Need for Family Participation in Patient Care: Yes (Comment) Care giver support system in place?: No (comment)   Criminal Activity/Legal Involvement Pertinent to Current Situation/Hospitalization: No - Comment as  needed  Activities of Daily Living Home Assistive Devices/Equipment: Walker (specify type) ADL Screening (condition at time of admission) Patient's cognitive ability adequate to safely complete daily activities?: Yes Is the patient deaf or have difficulty hearing?: No Does the patient have difficulty seeing, even when wearing glasses/contacts?: No Does the patient have difficulty concentrating, remembering, or making decisions?: No Patient able to express need for assistance with ADLs?: Yes Does the patient have difficulty dressing or bathing?: Yes Independently performs ADLs?: No Communication: Needs assistance Is this a change from baseline?: Change from baseline, expected to last >3 days Dressing (OT): Needs assistance Is this a change from baseline?: Change from baseline, expected to last >3 days Grooming: Needs assistance Is this a change from baseline?: Pre-admission baseline Feeding: Independent Bathing: Needs assistance Is this a change from baseline?: Change from baseline, expected to last >3 days Toileting: Needs assistance Is this a change from baseline?: Change from baseline, expected to last <3 days In/Out Bed: Needs assistance Is this a change from baseline?: Change from baseline, expected to last >3 days Walks in Home: Needs assistance Is this a change from baseline?: Change from baseline, expected to last <3 days Does the patient have difficulty walking or climbing stairs?: Yes Weakness of Legs: Both Weakness of Arms/Hands: None  Permission Sought/Granted Permission sought to share information with : Case  Manager, Family Supports Permission granted to share information with : Yes, Verbal Permission Granted  Share Information with NAME: Zabdi, Mis   [phone]  Natalia Leatherwood Sister [phone]    Erasmo Leventhal Sister [phone]  Permission granted to share info w AGENCY: SNF admissions        Emotional Assessment Appearance:: Appears stated  age Attitude/Demeanor/Rapport: Engaged Affect (typically observed): Accepting, Calm, Stable Orientation: : Oriented to Self, Oriented to Place, Oriented to  Time, Oriented to Situation Alcohol / Substance Use: Not Applicable Psych Involvement: No (comment)  Admission diagnosis:  Swelling [R60.9] Cellulitis [L03.90] Bruising [T14.8XXA] Cellulitis of left lower extremity [L03.116] Patient Active Problem List   Diagnosis Date Noted   Cellulitis 04/17/2022   DM2 (diabetes mellitus, type 2) ([place]) 04/17/2022   HTN (hypertension) 04/17/2022   Obesity, Class III, BMI 40-49.9 (morbid obesity) ([place]) 04/17/2022   HLD (hyperlipidemia) 04/17/2022   Hypokalemia 04/17/2022   Cancer of central portion of left female breast ([place]) 02/25/2016   Open left ankle fracture 08/19/2015   Fracture 08/19/2015   Gait difficulty 04/13/2013   Diabetic neuropathy ([place]) 04/13/2013   B12 deficiency 04/13/2013   Lumbar radiculopathy 04/13/2013   PCP:  Margarito Courser, MD Pharmacy:   [facility] ([facility] [place] [address] [place] ([place]) [place] [address] Phone: [phone] Fax: [phone]     Social Determinants of Health (SDOH) Interventions    Readmission Risk Interventions     No data to display

## 2022-04-18 NOTE — TOC Transition Note (Deleted)
Transition of Care [facility]) - CM/SW Discharge Note   Patient Details  Name: Tanya Juarez MRN: [id number] Date of Birth: 1946-02-09  [facility]) CM/SW Contact:  Ross Ludwig, LCSW Phone Number: 04/18/2022, 1:16 PM   Clinical Narrative:    Patient is a 76 year old female who is alert and oriented x4.  CSW spoke to patient's son who inquired about rehab options for patient.  CSW informed him of the process to find rehab for patient.  Per patient's son she has been to [facility] in the past, and would like to know what other facilities can accept patient.  CSW informed him about the process for looking for SNF placement and insurance authorization.  Patient's son would prefer [facility], and does not want her to go to [facility].  CSW was given permission to begin bed search in [facility].  Final next level of care: [place] Barriers to Discharge: Continued Medical Work up, Ship broker   Patient Goals and CMS Choice Patient states their goals for this hospitalization and ongoing recovery are:: To go to SNF for rehab, then return back home. CMS Medicare.gov Compare Post Acute Care list provided to:: Patient Represenative (must comment) Choice offered to / list presented to : Adult Children  Discharge Placement                       Discharge Plan and Services                                     Social Determinants of Health (SDOH) Interventions     Readmission Risk Interventions     No data to display

## 2022-04-18 NOTE — Progress Notes (Signed)
Bilateral LE venous duplex study completed. Please see CV Proc for preliminary results.  Dock Baccam BS, RVT 04/18/2022 11:29 AM

## 2022-04-18 NOTE — Evaluation (Signed)
Occupational Therapy Evaluation Patient Details Name: Tanya Juarez MRN: [id number] DOB: 02-19-1946 Today's Date: 04/18/2022   History of Present Illness 76 y.o. female with medical history significant of HTN, HLD, DM2, morbid obesity, breast CA/colon CA.  Pt admitted for left leg swelling and cellulitis after she stated she fell at home 2 weeks ago and injured her left leg.   Clinical Impression   Patient is a 76 year old female who was admitted for above. Patient was living at home with some family support prior level. Currently, patient is mod A X2 for transfers with RW and increased time and cues for sequencing of task. Patient was TD for LB bathing/dressing and min A for UB bathing/dressing tasks EOB. Patient was noted to have decreased functional activity tolerance, decreased endurance, decreased standing balance, decreased safety awareness, and decreased knowledge of AD/AE impacting participation in ADLs. Patient would continue to benefit from skilled OT services at this time while admitted and after d/c to address noted deficits in order to improve overall safety and independence in ADLs.        Recommendations for follow up therapy are one component of a multi-disciplinary discharge planning process, led by the attending physician.  Recommendations may be updated based on patient status, additional functional criteria and insurance authorization.   Follow Up Recommendations  Skilled nursing-short term rehab (<3 hours/day)    Assistance Recommended at Discharge Frequent or constant Supervision/Assistance  Patient can return home with the following A lot of help with bathing/dressing/bathroom;Assistance with cooking/housework;Direct supervision/assist for financial management;Assist for transportation;Help with stairs or ramp for entrance;Two people to help with walking and/or transfers;Direct supervision/assist for medications management    Functional Status Assessment  Patient has  had a recent decline in their functional status and demonstrates the ability to make significant improvements in function in a reasonable and predictable amount of time.  Equipment Recommendations  Other (comment) (defer to next venue)    Recommendations for Other Services       Precautions / Restrictions Precautions Precautions: Fall Restrictions Weight Bearing Restrictions: No      Mobility Bed Mobility Overal bed mobility: Needs Assistance Bed Mobility: Supine to Sit, Sit to Supine     Supine to sit: Mod assist, HOB elevated          Transfers                          Balance Overall balance assessment: Needs assistance, History of Falls Sitting-balance support: No upper extremity supported, Feet supported Sitting balance-Leahy Scale: Fair     Standing balance support: Bilateral upper extremity supported, Reliant on assistive device for balance, During functional activity Standing balance-Leahy Scale: Poor                             ADL either performed or assessed with clinical judgement   ADL Overall ADL's : Needs assistance/impaired Eating/Feeding: Set up;Sitting   Grooming: Set up;Sitting;Oral care;Wash/dry face;Min guard Grooming Details (indicate cue type and reason): EOB Upper Body Bathing: Set up;Minimal assistance;Sitting Upper Body Bathing Details (indicate cue type and reason): with increased time Lower Body Bathing: Maximal assistance;Sit to/from stand;Sitting/lateral leans   Upper Body Dressing : Minimal assistance;Sitting Upper Body Dressing Details (indicate cue type and reason): EOB Lower Body Dressing: Maximal assistance;Sit to/from stand;Sitting/lateral leans Lower Body Dressing Details (indicate cue type and reason): donning socks and adjustmetn of ACE wrap Toilet Transfer: Moderate assistance;Rolling  walker (2 wheels);Stand-pivot Armed forces technical officer Details (indicate cue type and reason): patient was able to stand with  mod A +2 to transfer to recliner in room with bed raised up and chair built up for transfer later. patient reported at home she uses a lift chair. Toileting- Clothing Manipulation and Hygiene: Total assistance;Sit to/from stand       Functional mobility during ADLs: +2 for physical assistance;+2 for safety/equipment;Moderate assistance;Rolling walker (2 wheels)       Vision Patient Visual Report: No change from baseline       Perception     Praxis      Pertinent Vitals/Pain Pain Assessment Pain Assessment: Faces Faces Pain Scale: Hurts a little bit Pain Location: LEs with touch/assist Pain Descriptors / Indicators: Sore, Tender Pain Intervention(s): Limited activity within patient's tolerance, Monitored during session     Hand Dominance Right   Extremity/Trunk Assessment Upper Extremity Assessment Upper Extremity Assessment: Overall WFL for tasks assessed   Lower Extremity Assessment Lower Extremity Assessment: Defer to PT evaluation   Cervical / Trunk Assessment Cervical / Trunk Assessment: Other exceptions Cervical / Trunk Exceptions: increased body habitus with muscular atrophy of extremities   Communication Communication Communication: No difficulties   Cognition Arousal/Alertness: Awake/alert Behavior During Therapy: WFL for tasks assessed/performed Overall Cognitive Status: Within Functional Limits for tasks assessed                                 General Comments: motivated to get out of bed     General Comments       Exercises     Shoulder Instructions      Home Living Family/patient expects to be discharged to:: Private residence Living Arrangements: Alone;Other relatives Available Help at Discharge: Family Type of Home: House Home Access: Ramped entrance     Home Layout: One level               [place]: Rollator (4 wheels);BSC/3in1   Additional Comments: lift chair      Prior Functioning/Environment Prior  Level of Function : Independent/Modified Independent             Mobility Comments: patient reported being able to use rollator in house for limited mobility with recently having rented a hosptial bed. ADLs Comments: patient reported being independent in ADLs PLOF        OT Problem List: Decreased activity tolerance;Impaired balance (sitting and/or standing);Decreased safety awareness;Decreased knowledge of precautions;Decreased knowledge of use of DME or AE;Obesity      OT Treatment/Interventions: Self-care/ADL training;Therapeutic exercise;Neuromuscular education;Energy conservation;DME and/or AE instruction;Therapeutic activities;Balance training;Patient/family education    OT Goals(Current goals can be found in the care plan section) Acute Rehab OT Goals Patient Stated Goal: to get better and get back home OT Goal Formulation: With patient Time For Goal Achievement: 05/02/22 Potential to Achieve Goals: Fair  OT Frequency: Min 2X/week    Co-evaluation              AM-PAC OT "6 Clicks" Daily Activity     Outcome Measure Help from another person eating meals?: None Help from another person taking care of personal grooming?: A Little Help from another person toileting, which includes using toliet, bedpan, or urinal?: A Lot Help from another person bathing (including washing, rinsing, drying)?: A Lot Help from another person to put on and taking off regular upper body clothing?: A Little Help from another person to put on and  taking off regular lower body clothing?: A Lot 6 Click Score: 16   End of Session Equipment Utilized During Treatment: Gait belt;Rolling walker (2 wheels) Nurse Communication: Mobility status;Other (comment) (patient declining to elevate BLE even with education)  Activity Tolerance: Patient tolerated treatment well Patient left: in chair;with call bell/phone within reach;with chair alarm set  OT Visit Diagnosis: Unsteadiness on feet (R26.81);Other  abnormalities of gait and mobility (R26.89);Muscle weakness (generalized) (M62.81);History of falling (Z91.81)                Time: 3967-2897 OT Time Calculation (min): 26 min Charges:  OT General Charges $OT Visit: 1 Visit OT Evaluation $OT Eval Moderate Complexity: 1 Mod OT Treatments $Self Care/Home Management : 8-22 mins  Jackelyn Poling OTR/L, MS Acute Rehabilitation Department Office# [phone] Pager# [phone]   Marcellina Millin 04/18/2022, 9:58 AM

## 2022-04-18 NOTE — NC FL2 (Signed)
[place] LEVEL OF CARE SCREENING TOOL     IDENTIFICATION  Patient Name: Tanya Juarez Birthdate: 1946-07-17 Sex: female Admission Date (Current Location): 04/16/2022  [facility] and [place] Number:  Herbalist and Address:         Provider Number: [id number]  Attending Physician Name and Address:  Darliss Cheney, MD  Relative Name and Phone Number:  [name], Nicholls   [phone]  [facility] Sister [place] Sister [phone]    Current Level of Care: Hospital Recommended Level of Care: [place] Prior Approval Number:    Date Approved/Denied:   PASRR Number: [id number]  Discharge Plan: SNF    Current Diagnoses: Patient Active Problem List   Diagnosis Date Noted   Cellulitis 04/17/2022   DM2 (diabetes mellitus, type 2) ([place]) 04/17/2022   HTN (hypertension) 04/17/2022   Obesity, Class III, BMI 40-49.9 (morbid obesity) ([place]) 04/17/2022   HLD (hyperlipidemia) 04/17/2022   Hypokalemia 04/17/2022   Cancer of central portion of left female breast ([place]) 02/25/2016   Open left ankle fracture 08/19/2015   Fracture 08/19/2015   Gait difficulty 04/13/2013   Diabetic neuropathy ([place]) 04/13/2013   B12 deficiency 04/13/2013   Lumbar radiculopathy 04/13/2013    Orientation RESPIRATION BLADDER Height & Weight     Self, Time, Situation, Place  Normal Incontinent Weight: 241 lb 2.9 oz (109.4 kg) Height:  '5\' 5"'$  (165.1 cm)  BEHAVIORAL SYMPTOMS/MOOD NEUROLOGICAL BOWEL NUTRITION STATUS      Continent Diet (Carb Modified Diet)  AMBULATORY STATUS COMMUNICATION OF NEEDS Skin   Limited Assist Verbally Other (Comment) (Leg wound)                       Personal Care Assistance Level of Assistance  Bathing, Feeding, Dressing Bathing Assistance: Limited assistance Feeding assistance: Independent Dressing Assistance: Limited assistance     Functional Limitations Info  Sight, Speech, Hearing Sight Info:  Adequate Hearing Info: Adequate Speech Info: Adequate    SPECIAL CARE FACTORS FREQUENCY  PT (By licensed PT), OT (By licensed OT)     PT Frequency: Minimum 5x a week OT Frequency: Minimum 5x a week            Contractures Contractures Info: Not present    Additional Factors Info  Allergies, Code Status, Insulin Sliding Scale Code Status Info: Full Code Allergies Info: Amlodipine   Insulin Sliding Scale Info: insulin aspart (novoLOG) injection 0-9 Units 3x a day with meals       Current Medications (04/18/2022):  This is the current hospital active medication list Current Facility-Administered Medications  Medication Dose Route Frequency Provider Last Rate Last Admin   acetaminophen (TYLENOL) tablet 650 mg  650 mg Oral Q6H PRN Marylyn Ishihara, Tyrone A, DO       Or   acetaminophen (TYLENOL) suppository 650 mg  650 mg Rectal Q6H PRN Marylyn Ishihara, Tyrone A, DO       ceFAZolin (ANCEF) IVPB 2g/100 mL premix  2 g Intravenous Q8H Kyle, Tyrone A, DO 200 mL/hr at 04/18/22 1004 2 g at 04/18/22 1004   enoxaparin (LOVENOX) injection 40 mg  40 mg Subcutaneous Q24H Kyle, Tyrone A, DO   40 mg at 04/17/22 1300   ferrous sulfate tablet 325 mg  325 mg Oral Q1500 Kyle, Tyrone A, DO   325 mg at 04/17/22 1432   hydrALAZINE (APRESOLINE) injection 10 mg  10 mg Intravenous Q8H PRN Kyle, Tyrone A, DO       irbesartan (  AVAPRO) tablet 300 mg  300 mg Oral Daily Marylyn Ishihara, Tyrone A, DO   300 mg at 04/18/22 1000   And   hydrochlorothiazide (HYDRODIURIL) tablet 12.5 mg  12.5 mg Oral Daily Kyle, Tyrone A, DO   12.5 mg at 04/18/22 1000   HYDROcodone-acetaminophen (NORCO/VICODIN) 5-325 MG per tablet 1-2 tablet  1-2 tablet Oral Q4H PRN Marylyn Ishihara, Tyrone A, DO       insulin aspart (novoLOG) injection 0-9 Units  0-9 Units Subcutaneous TID WC Kyle, Tyrone A, DO   1 Units at 04/18/22 1001   ondansetron (ZOFRAN) tablet 4 mg  4 mg Oral Q6H PRN Marylyn Ishihara, Tyrone A, DO       Or   ondansetron (ZOFRAN) injection 4 mg  4 mg Intravenous Q6H PRN Marylyn Ishihara,  Tyrone A, DO       pravastatin (PRAVACHOL) tablet 20 mg  20 mg Oral q1800 Kyle, Tyrone A, DO   20 mg at 04/17/22 1710   spironolactone (ALDACTONE) tablet 25 mg  25 mg Oral Daily Kyle, Tyrone A, DO   25 mg at 04/18/22 1000     Discharge Medications: Please see discharge summary for a list of discharge medications.  Relevant Imaging Results:  Relevant Lab Results:   Additional Information SSN [id number]  Ross Ludwig, LCSW

## 2022-04-18 NOTE — Progress Notes (Signed)
PROGRESS NOTE    Tanya Juarez  WLN:[id number] DOB: 1946-08-27 DOA: 04/16/2022 PCP: Margarito Courser, MD   Brief Narrative:  HPI: Tanya Juarez is a 76 y.o. female with medical history significant of HTN, HLD, DM2, morbid obesity, breast CA/colon CA. Presenting with left leg swelling. She fell at home 2 weeks ago and injured her left leg. She says she was walking between rooms in her house and her legs just gave out. She was already following with a wound care specialist for another leg wound. They examined her and gave her instructions for care. She says she was doing fine until the last week or so. She says it seems more red and swollen. Her family sent pictures to the wound car doctor 2 days ago and they recommended that she go to the ED for evaluation. She denies any other aggravating or alleviating factors.    Assessment & Plan:   Principal Problem:   Cellulitis Active Problems:   Cancer of central portion of left female breast ([place])   DM2 (diabetes mellitus, type 2) ([facility])   HTN (hypertension)   Obesity, Class III, BMI 40-49.9 (morbid obesity) ([facility])   HLD (hyperlipidemia)   Hypokalemia  Left lower extremity cellulitis: On my evaluation today, patient has very minimal area of erythema on the medial side of the left extremity just above the knee.  It is slightly tender.  Both of her calves are tender.  Patient tells me that her erythema is much better than yesterday.  She is happy about that.  Continue cefazolin.  I will also obtain Doppler lower extremity to rule out DVT.  Type 2 diabetes mellitus: Does not appear to be on any medications at home for this, recent hemoglobin A1c on 12/10/2021 was 6.7.  Continue SSI.  Essential hypertension: Blood pressure controlled.  Continue home regimen.  Morbid obesity: Dietary modification as well as weight loss counseled.  Hyperlipidemia: Continue statin.  Hypokalemia: Resolved.  History of colon and breast CA: Outpatient follow-up with  concerned physicians.  Generalized weakness: Seen by PT and they recommend SNF.  Patient tells me that her son wants her to go to SNF and she is also interested.  We will consult TOC.  DVT prophylaxis: enoxaparin (LOVENOX) injection 40 mg Start: 04/17/22 1200   Code Status: Full Code  Family Communication:  None present at bedside.  Plan of care discussed with patient in length and he/she verbalized understanding and agreed with it.  Status is: Inpatient Remains inpatient appropriate because: Needs placement to SNF.   Estimated body mass index is 40.13 kg/m as calculated from the following:   Height as of this encounter: '5\' 5"'$  (1.651 m).   Weight as of this encounter: 109.4 kg.    Nutritional Assessment: Body mass index is 40.13 kg/m.Marland Kitchen Seen by dietician.  I agree with the assessment and plan as outlined below: Nutrition Status:        . Skin Assessment: I have examined the patient's skin and I agree with the wound assessment as performed by the wound care RN as outlined below:    Consultants:  None  Procedures:  None  Antimicrobials:  Anti-infectives (From admission, onward)    Start     Dose/Rate Route Frequency Ordered Stop   04/17/22 1045  ceFAZolin (ANCEF) IVPB 2g/100 mL premix        2 g 200 mL/hr over 30 Minutes Intravenous Every 8 hours 04/17/22 0949     04/16/22 2245  vancomycin (VANCOCIN) IVPB 1000 mg/200 mL  premix        1,000 mg 200 mL/hr over 60 Minutes Intravenous  Once 04/16/22 2233 04/17/22 0121   04/16/22 2245  ceFEPIme (MAXIPIME) 2 g in sodium chloride 0.9 % 100 mL IVPB        2 g 200 mL/hr over 30 Minutes Intravenous  Once 04/16/22 2233 04/17/22 0014         Subjective: Seen and examined.  She states that she is feeling better, she is happy that her erythema has improved.  Objective: Vitals:   04/17/22 1105 04/17/22 1302 04/17/22 1952 04/18/22 0523  BP: (!) 197/77 (!) 172/76 (!) 157/70 139/83  Pulse: 66 68 71 64  Resp: 20 (!) '24 15 18   '$ Temp: 98.2 F (36.8 C) 97.7 F (36.5 C) 99 F (37.2 C) 98.2 F (36.8 C)  TempSrc:   Oral Oral  SpO2: 98% 96% 95% 100%  Weight:      Height:        Intake/Output Summary (Last 24 hours) at 04/18/2022 0953 Last data filed at 04/18/2022 0900 Gross per 24 hour  Intake 581.17 ml  Output 300 ml  Net 281.17 ml   Filed Weights   04/17/22 0649  Weight: 109.4 kg    Examination:  General exam: Appears calm and comfortable  Respiratory system: Clear to auscultation. Respiratory effort normal. Cardiovascular system: S1 & S2 heard, RRR. No JVD, murmurs, rubs, gallops or clicks. No pedal edema. Gastrointestinal system: Abdomen is nondistended, soft and nontender. No organomegaly or masses felt. Normal bowel sounds heard. Central nervous system: Alert and oriented. No focal neurological deficits. Extremities: Symmetric 5 x 5 power.  Bilateral calf tenderness. Skin: Very mild erythema and swelling on the medial side of the left lower extremity just above the knee. Psychiatry: Judgement and insight appear normal. Mood & affect appropriate.    Data Reviewed: I have personally reviewed following labs and imaging studies  CBC: Recent Labs  Lab 04/16/22 2336 04/17/22 0952 04/18/22 0427  WBC 12.5* 12.0* 11.0*  NEUTROABS 10.5*  --   --   HGB 12.2 11.6* 9.9*  HCT 38.1 37.9 31.7*  MCV 97.7 100.3* 98.8  PLT 210 193 347   Basic Metabolic Panel: Recent Labs  Lab 04/16/22 2336 04/17/22 0852 04/17/22 0952 04/18/22 0427  NA 143  --   --  142  K 3.0*  --   --  3.7  CL 107  --   --  111  CO2 26  --   --  26  GLUCOSE 120*  --   --  137*  BUN 24*  --   --  22  CREATININE 0.80  --  0.77 0.73  CALCIUM 9.1  --   --  7.9*  MG  --  2.0  --   --    GFR: Estimated Creatinine Clearance: 76 mL/min (by C-G formula based on SCr of 0.73 mg/dL). Liver Function Tests: Recent Labs  Lab 04/16/22 2336 04/18/22 0427  AST 8* 10*  ALT 13 13  ALKPHOS 50 42  BILITOT 0.9 0.6  PROT 6.3* 4.9*   ALBUMIN 3.4* 2.1*   No results for input(s): "LIPASE", "AMYLASE" in the last 168 hours. No results for input(s): "AMMONIA" in the last 168 hours. Coagulation Profile: Recent Labs  Lab 04/16/22 2336  INR 1.1   Cardiac Enzymes: No results for input(s): "CKTOTAL", "CKMB", "CKMBINDEX", "TROPONINI" in the last 168 hours. BNP (last 3 results) No results for input(s): "PROBNP" in the last 8760 hours. HbA1C:  Recent Labs    04/17/22 0852  HGBA1C 6.5*   CBG: Recent Labs  Lab 04/17/22 1258 04/17/22 1700 04/17/22 2126 04/18/22 0807  GLUCAP 110* 90 135* 122*   Lipid Profile: No results for input(s): "CHOL", "HDL", "LDLCALC", "TRIG", "CHOLHDL", "LDLDIRECT" in the last 72 hours. Thyroid Function Tests: No results for input(s): "TSH", "T4TOTAL", "FREET4", "T3FREE", "THYROIDAB" in the last 72 hours. Anemia Panel: No results for input(s): "VITAMINB12", "FOLATE", "FERRITIN", "TIBC", "IRON", "RETICCTPCT" in the last 72 hours. Sepsis Labs: Recent Labs  Lab 04/16/22 2336  LATICACIDVEN 1.1    Recent Results (from the past 240 hour(s))  Blood culture (routine x 2)     Status: None (Preliminary result)   Collection Time: 04/16/22 11:36 PM   Specimen: BLOOD  Result Value Ref Range Status   Specimen Description   Final    BLOOD RIGHT ANTECUBITAL Performed at [facility], [address], [place], [place] [address]    Special Requests   Final    BOTTLES DRAWN AEROBIC AND ANAEROBIC Blood Culture adequate volume Performed at [facility], [address] [place], [place] [address]    Culture   Final    NO GROWTH < 24 HOURS Performed at [facility], [place] [address], [place], [place] [address]    Report Status PENDING  Incomplete  Blood culture (routine x 2)     Status: None (Preliminary result)   Collection Time: 04/17/22  7:18 AM   Specimen: BLOOD LEFT HAND  Result Value Ref Range Status   Specimen Description BLOOD LEFT HAND  Final    Special Requests IN PEDIATRIC BOTTLE Blood Culture adequate volume  Final   Culture   Final    NO GROWTH < 12 HOURS Performed at [facility], [place] [address] [place], [place] [address]    Report Status PENDING  Incomplete     Radiology Studies: CT EXTREMITY LOWER LEFT W CONTRAST  Result Date: 04/17/2022 CLINICAL DATA:  Soft tissue infection suspected, lower leg. Concern for [employer] injury. EXAM: CT OF THE LOWER LEFT EXTREMITY WITH CONTRAST TECHNIQUE: Multidetector CT imaging of the lower left extremity was performed according to the standard protocol following intravenous contrast administration. RADIATION DOSE REDUCTION: This exam was performed according to the departmental dose-optimization program which includes automated exposure control, adjustment of the mA and/or kV according to patient size and/or use of iterative reconstruction technique. CONTRAST:  186m OMNIPAQUE IOHEXOL 300 MG/ML  SOLN COMPARISON:  None Available. FINDINGS: Bones/Joint/Cartilage No acute fracture or dislocation. Fixation hardware is noted at the distal fibula. Mild-to-moderate degenerative changes are noted in the medial and patellofemoral compartments at the knee. Calcaneal spurring is noted. Multiple bony densities are seen inferior to the medial malleolus, possible old avulsion fractures. A small joint effusion is noted at the knee. Ligaments Suboptimally assessed by CT. Muscles and Tendons Diffuse muscular atrophy is noted. No intramuscular hematoma. The quadriceps and patellar tendons are intact. Soft tissues There is diffuse subcutaneous fat stranding and edema about the right lower extremity. Scattered irregular hyperdense foci are noted in the subcutaneous soft tissues along the posteromedial aspect of the calf suggesting small multifocal hematomas. No abnormal enhancement is seen. No encapsulated fluid collection is seen. Vascular calcifications are present in the soft tissues. IMPRESSION: 1. Diffuse  subcutaneous edema and fat stranding in the right lower extremity. No encapsulated fluid collection or abnormal enhancement is seen. 2. Scattered hyperdense regions in the subcutaneous tissues overlying the posteromedial calf, possible small hemorrhages.  3. No acute osseous abnormality. Electronically Signed   By: Brett Fairy M.D.   On: 04/17/2022 01:44    Scheduled Meds:  enoxaparin (LOVENOX) injection  40 mg Subcutaneous Q24H   ferrous sulfate  325 mg Oral Q1500   irbesartan  300 mg Oral Daily   And   hydrochlorothiazide  12.5 mg Oral Daily   insulin aspart  0-9 Units Subcutaneous TID WC   potassium chloride  40 mEq Oral BID   pravastatin  20 mg Oral q1800   spironolactone  25 mg Oral Daily   Continuous Infusions:   ceFAZolin (ANCEF) IV 2 g (04/18/22 0227)     LOS: 1 day   Darliss Cheney, MD [facility]  04/18/2022, 9:53 AM   *Please note that this is a verbal dictation therefore any spelling or grammatical errors are due to the "[place] [employer]" system interpretation.  Please page via [place] and do not message via secure chat for urgent patient care matters. Secure chat can be used for non urgent patient care matters.  How to contact the [facility] Attending or Consulting provider [place] or covering provider during after hours [place], for this patient?  Check the care team in [facility] and look for a) attending/consulting [facility] provider listed and b) the [facility] team listed. Page or secure chat [phone]. Log into [email or web address] and use [place]'s universal password to access. If you do not have the password, please contact the hospital operator. Locate the [facility] provider you are looking for under [facility] and page to a number that you can be directly reached. If you still have difficulty reaching the provider, please page the [facility] (Director on Call) for the Hospitalists listed on amion for assistance.

## 2022-04-19 DIAGNOSIS — L03116 Cellulitis of left lower limb: Secondary | ICD-10-CM | POA: Diagnosis not present

## 2022-04-19 LAB — CBC WITH DIFFERENTIAL/PLATELET
Abs Immature Granulocytes: 0.92 10*3/uL — ABNORMAL HIGH (ref 0.00–0.07)
Basophils Absolute: 0.1 10*3/uL (ref 0.0–0.1)
Basophils Relative: 1 %
Eosinophils Absolute: 0.1 10*3/uL (ref 0.0–0.5)
Eosinophils Relative: 1 %
HCT: 32.9 % — ABNORMAL LOW (ref 36.0–46.0)
Hemoglobin: 9.9 g/dL — ABNORMAL LOW (ref 12.0–15.0)
Immature Granulocytes: 9 %
Lymphocytes Relative: 11 %
Lymphs Abs: 1.1 10*3/uL (ref 0.7–4.0)
MCH: 30.5 pg (ref 26.0–34.0)
MCHC: 30.1 g/dL (ref 30.0–36.0)
MCV: 101.2 fL — ABNORMAL HIGH (ref 80.0–100.0)
Monocytes Absolute: 0.7 10*3/uL (ref 0.1–1.0)
Monocytes Relative: 7 %
Neutro Abs: 7.4 10*3/uL (ref 1.7–7.7)
Neutrophils Relative %: 71 %
Platelets: 204 10*3/uL (ref 150–400)
RBC: 3.25 MIL/uL — ABNORMAL LOW (ref 3.87–5.11)
RDW: 14.6 % (ref 11.5–15.5)
WBC: 10.4 10*3/uL (ref 4.0–10.5)
nRBC: 0 % (ref 0.0–0.2)

## 2022-04-19 LAB — BASIC METABOLIC PANEL
Anion gap: 3 — ABNORMAL LOW (ref 5–15)
BUN: 23 mg/dL (ref 8–23)
CO2: 27 mmol/L (ref 22–32)
Calcium: 8.4 mg/dL — ABNORMAL LOW (ref 8.9–10.3)
Chloride: 112 mmol/L — ABNORMAL HIGH (ref 98–111)
Creatinine, Ser: 0.78 mg/dL (ref 0.44–1.00)
GFR, Estimated: >60 mL/min (ref >60)
Glucose, Bld: 158 mg/dL — ABNORMAL HIGH (ref 70–99)
Potassium: 4.5 mmol/L (ref 3.5–5.1)
Sodium: 142 mmol/L (ref 135–145)

## 2022-04-19 LAB — GLUCOSE, CAPILLARY
Glucose-Capillary: 123 mg/dL — ABNORMAL HIGH (ref 70–99)
Glucose-Capillary: 128 mg/dL — ABNORMAL HIGH (ref 70–99)
Glucose-Capillary: 120 mg/dL — ABNORMAL HIGH (ref 70–99)
Glucose-Capillary: 138 mg/dL — ABNORMAL HIGH (ref 70–99)

## 2022-04-19 NOTE — Progress Notes (Signed)
PROGRESS NOTE    Tanya Juarez  NWG:[id number] DOB: 1946/04/14 DOA: 04/16/2022 PCP: Margarito Courser, MD   Brief Narrative:  HPI: Tanya Juarez is a 76 y.o. female with medical history significant of HTN, HLD, DM2, morbid obesity, breast CA/colon CA. Presenting with left leg swelling. She fell at home 2 weeks ago and injured her left leg. She says she was walking between rooms in her house and her legs just gave out. She was already following with a wound care specialist for another leg wound. They examined her and gave her instructions for care. She says she was doing fine until the last week or so. She says it seems more red and swollen. Her family sent pictures to the wound car doctor 2 days ago and they recommended that she go to the ED for evaluation. She denies any other aggravating or alleviating factors.    Assessment & Plan:   Principal Problem:   Cellulitis Active Problems:   Cancer of central portion of left female breast ([place])   DM2 (diabetes mellitus, type 2) (HCC)   HTN (hypertension)   Obesity, Class III, BMI 40-49.9 (morbid obesity) (HCC)   HLD (hyperlipidemia)   Hypokalemia  Left lower extremity cellulitis: Cellulitis much improved.  Doppler lower extremity ruled out DVT.  Continue current antibiotics.  Type 2 diabetes mellitus: Does not appear to be on any medications at home for this, recent hemoglobin A1c on 12/10/2021 was 6.7.  Continue SSI.  Essential hypertension: Blood pressure controlled.  Continue home regimen.  Morbid obesity: Dietary modification as well as weight loss counseled.  Hyperlipidemia: Continue statin.  Hypokalemia: Resolved.  History of colon and breast CA: Outpatient follow-up with concerned physicians.  Generalized weakness: Seen by PT and they recommend SNF.  Patient tells me that her son wants her to go to SNF and she is also interested.  TOC on board working on placement.  DVT prophylaxis: enoxaparin (LOVENOX) injection 40 mg Start:  04/17/22 1200   Code Status: Full Code  Family Communication:  None present at bedside.  Plan of care discussed with patient in length and he/she verbalized understanding and agreed with it.  Status is: Inpatient Remains inpatient appropriate because: Needs placement to SNF.   Estimated body mass index is 40.13 kg/m as calculated from the following:   Height as of this encounter: '5\' 5"'$  (1.651 m).   Weight as of this encounter: 109.4 kg.    Nutritional Assessment: Body mass index is 40.13 kg/m.Marland Kitchen Seen by dietician.  I agree with the assessment and plan as outlined below: Nutrition Status:        . Skin Assessment: I have examined the patient's skin and I agree with the wound assessment as performed by the wound care RN as outlined below:    Consultants:  None  Procedures:  None  Antimicrobials:  Anti-infectives (From admission, onward)    Start     Dose/Rate Route Frequency Ordered Stop   04/17/22 1045  ceFAZolin (ANCEF) IVPB 2g/100 mL premix        2 g 200 mL/hr over 30 Minutes Intravenous Every 8 hours 04/17/22 0949     04/16/22 2245  vancomycin (VANCOCIN) IVPB 1000 mg/200 mL premix        1,000 mg 200 mL/hr over 60 Minutes Intravenous  Once 04/16/22 2233 04/17/22 0121   04/16/22 2245  ceFEPIme (MAXIPIME) 2 g in sodium chloride 0.9 % 100 mL IVPB        2 g 200 mL/hr over 30  Minutes Intravenous  Once 04/16/22 2233 04/17/22 0014         Subjective:  Patient seen and examined.  She has no complaints.  She is looking forward to going to SNF.  She was very thankful for the care that she has received at the hospital.  Objective: Vitals:   04/18/22 1237 04/18/22 2036 04/19/22 0415 04/19/22 0907  BP: (!) 164/64 (!) 157/94 (!) 156/77 (!) 173/71  Pulse: (!) 57  (!) 59 71  Resp: '18 18 20   '$ Temp: 97.8 F (36.6 C) 98.2 F (36.8 C) 98.2 F (36.8 C)   TempSrc: Oral Oral Oral   SpO2: 97% 97% 98%   Weight:      Height:        Intake/Output Summary (Last 24  hours) at 04/19/2022 1257 Last data filed at 04/19/2022 1200 Gross per 24 hour  Intake 820 ml  Output 800 ml  Net 20 ml    Filed Weights   04/17/22 0649  Weight: 109.4 kg    Examination:  General exam: Appears calm and comfortable  Respiratory system: Clear to auscultation. Respiratory effort normal. Cardiovascular system: S1 & S2 heard, RRR. No JVD, murmurs, rubs, gallops or clicks. No pedal edema. Gastrointestinal system: Abdomen is nondistended, soft and nontender. No organomegaly or masses felt. Normal bowel sounds heard. Central nervous system: Alert and oriented. No focal neurological deficits. Extremities: Symmetric 5 x 5 power. Skin: Very minimal erythema on the medial side of the left lower extremity just under the knee. Psychiatry: Judgement and insight appear normal. Mood & affect appropriate.   Data Reviewed: I have personally reviewed following labs and imaging studies  CBC: Recent Labs  Lab 04/16/22 2336 04/17/22 0952 04/18/22 0427 04/19/22 0349  WBC 12.5* 12.0* 11.0* 10.4  NEUTROABS 10.5*  --   --  7.4  HGB 12.2 11.6* 9.9* 9.9*  HCT 38.1 37.9 31.7* 32.9*  MCV 97.7 100.3* 98.8 101.2*  PLT 210 193 177 626    Basic Metabolic Panel: Recent Labs  Lab 04/16/22 2336 04/17/22 0852 04/17/22 0952 04/18/22 0427 04/19/22 0349  NA 143  --   --  142 142  K 3.0*  --   --  3.7 4.5  CL 107  --   --  111 112*  CO2 26  --   --  26 27  GLUCOSE 120*  --   --  137* 158*  BUN 24*  --   --  22 23  CREATININE 0.80  --  0.77 0.73 0.78  CALCIUM 9.1  --   --  7.9* 8.4*  MG  --  2.0  --   --   --     GFR: Estimated Creatinine Clearance: 76 mL/min (by C-G formula based on SCr of 0.78 mg/dL). Liver Function Tests: Recent Labs  Lab 04/16/22 2336 04/18/22 0427  AST 8* 10*  ALT 13 13  ALKPHOS 50 42  BILITOT 0.9 0.6  PROT 6.3* 4.9*  ALBUMIN 3.4* 2.1*    No results for input(s): "LIPASE", "AMYLASE" in the last 168 hours. No results for input(s): "AMMONIA" in the  last 168 hours. Coagulation Profile: Recent Labs  Lab 04/16/22 2336  INR 1.1    Cardiac Enzymes: No results for input(s): "CKTOTAL", "CKMB", "CKMBINDEX", "TROPONINI" in the last 168 hours. BNP (last 3 results) No results for input(s): "PROBNP" in the last 8760 hours. HbA1C: Recent Labs    04/17/22 0852  HGBA1C 6.5*    CBG: Recent Labs  Lab 04/18/22  1130 04/18/22 1620 04/18/22 2039 04/19/22 0737 04/19/22 1154  GLUCAP 131* 147* 176* 123* 128*    Lipid Profile: No results for input(s): "CHOL", "HDL", "LDLCALC", "TRIG", "CHOLHDL", "LDLDIRECT" in the last 72 hours. Thyroid Function Tests: No results for input(s): "TSH", "T4TOTAL", "FREET4", "T3FREE", "THYROIDAB" in the last 72 hours. Anemia Panel: No results for input(s): "VITAMINB12", "FOLATE", "FERRITIN", "TIBC", "IRON", "RETICCTPCT" in the last 72 hours. Sepsis Labs: Recent Labs  Lab 04/16/22 2336  LATICACIDVEN 1.1     Recent Results (from the past 240 hour(s))  Blood culture (routine x 2)     Status: None (Preliminary result)   Collection Time: 04/16/22 11:36 PM   Specimen: BLOOD  Result Value Ref Range Status   Specimen Description   Final    BLOOD RIGHT ANTECUBITAL Performed at [facility], [address] [place], [place] [address]    Special Requests   Final    BOTTLES DRAWN AEROBIC AND ANAEROBIC Blood Culture adequate volume Performed at [facility], [address], [place], [place] [address]    Culture   Final    NO GROWTH 2 DAYS Performed at [facility], [place] [address] [place], [place] [address]    Report Status PENDING  Incomplete  Blood culture (routine x 2)     Status: None (Preliminary result)   Collection Time: 04/17/22  7:18 AM   Specimen: BLOOD LEFT HAND  Result Value Ref Range Status   Specimen Description BLOOD LEFT HAND  Final   Special Requests IN PEDIATRIC BOTTLE Blood Culture adequate volume  Final   Culture   Final    NO GROWTH 2  DAYS Performed at [facility], [place] [address] [place], [place] [address]    Report Status PENDING  Incomplete     Radiology Studies: VAS [place] LOWER EXTREMITY VENOUS (DVT)  Result Date: 04/19/2022  Lower Venous DVT Study Patient Name:  MIKO MARKWOOD  Date of Exam:   04/18/2022 Medical Rec #: [id number]       Accession #:    [id number] Date of Birth: [birth year]       Patient Gender: F Patient Age:   72 years Exam [facility]:  [facility] Procedure:      VAS [place] LOWER EXTREMITY VENOUS (DVT) Referring Phys: Rhya Shan --------------------------------------------------------------------------------  Indications: Swelling.  Comparison Study: No previous exam noted. Performing Technologist: Bobetta Lime BS, RVT  Examination Guidelines: A complete evaluation includes B-mode imaging, spectral Doppler, color Doppler, and power Doppler as needed of all accessible portions of each vessel. Bilateral testing is considered an integral part of a complete examination. Limited examinations for reoccurring indications may be performed as noted. The reflux portion of the exam is performed with the patient in reverse Trendelenburg.  +---------+---------------+---------+-----------+----------+--------------+ RIGHT    CompressibilityPhasicitySpontaneityPropertiesThrombus Aging +---------+---------------+---------+-----------+----------+--------------+ CFV      Full           Yes      Yes                                 +---------+---------------+---------+-----------+----------+--------------+ SFJ      Full                                                        +---------+---------------+---------+-----------+----------+--------------+  FV Prox  Full                                                        +---------+---------------+---------+-----------+----------+--------------+ FV Mid   Full                                                         +---------+---------------+---------+-----------+----------+--------------+ FV DistalFull                                                        +---------+---------------+---------+-----------+----------+--------------+ PFV      Full                                                        +---------+---------------+---------+-----------+----------+--------------+ POP      Full           Yes      Yes                                 +---------+---------------+---------+-----------+----------+--------------+ PTV      Full                                                        +---------+---------------+---------+-----------+----------+--------------+ PERO     Full                                                        +---------+---------------+---------+-----------+----------+--------------+   +---------+---------------+---------+-----------+----------+-------------------+ LEFT     CompressibilityPhasicitySpontaneityPropertiesThrombus Aging      +---------+---------------+---------+-----------+----------+-------------------+ CFV      Full           Yes      Yes                                      +---------+---------------+---------+-----------+----------+-------------------+ SFJ      Full                                                             +---------+---------------+---------+-----------+----------+-------------------+ FV Prox  Full                                                             +---------+---------------+---------+-----------+----------+-------------------+  FV Mid   Full                                                             +---------+---------------+---------+-----------+----------+-------------------+ FV DistalFull                                                             +---------+---------------+---------+-----------+----------+-------------------+ PFV      Full                                                              +---------+---------------+---------+-----------+----------+-------------------+ POP      Full           Yes      Yes                                      +---------+---------------+---------+-----------+----------+-------------------+ PTV                                                   Not well visualized +---------+---------------+---------+-----------+----------+-------------------+ PERO                                                  Not well visualized +---------+---------------+---------+-----------+----------+-------------------+   Left Technical Findings: The left calf veins were not visualized to due bandaging and wounds at the calf area.   Summary: BILATERAL: - No evidence of deep vein thrombosis seen in the lower extremities, bilaterally. -No evidence of popliteal cyst, bilaterally.   *See table(s) above for measurements and observations. Electronically signed by Monica Martinez MD on 04/19/2022 at 10:16:27 AM.    Final     Scheduled Meds:  enoxaparin (LOVENOX) injection  40 mg Subcutaneous Q24H   ferrous sulfate  325 mg Oral Q1500   irbesartan  300 mg Oral Daily   And   hydrochlorothiazide  12.5 mg Oral Daily   insulin aspart  0-9 Units Subcutaneous TID WC   pravastatin  20 mg Oral q1800   spironolactone  25 mg Oral Daily   Continuous Infusions:   ceFAZolin (ANCEF) IV 2 g (04/19/22 0911)     LOS: 2 days   Darliss Cheney, MD [facility]  04/19/2022, 12:57 PM   *Please note that this is a verbal dictation therefore any spelling or grammatical errors are due to the "[employer]" system interpretation.  Please page via [place] and do not message via secure chat for urgent patient care matters. Secure chat can be used for non urgent patient care matters.  How to contact the [facility] Attending or Consulting provider  7A - 7P or covering provider during after hours [place], for this patient?  Check the care team in [facility] and look for a)  attending/consulting [facility] provider listed and b) the [facility] team listed. Page or secure chat 7A-7P. Log into [email or web address] and use [place]'s universal password to access. If you do not have the password, please contact the hospital operator. Locate the [facility] provider you are looking for under [facility] and page to a number that you can be directly reached. If you still have difficulty reaching the provider, please page the [facility] (Director on Call) for the Hospitalists listed on amion for assistance.

## 2022-04-20 LAB — GLUCOSE, CAPILLARY
Glucose-Capillary: 119 mg/dL — ABNORMAL HIGH (ref 70–99)
Glucose-Capillary: 146 mg/dL — ABNORMAL HIGH (ref 70–99)
Glucose-Capillary: 146 mg/dL — ABNORMAL HIGH (ref 70–99)
Glucose-Capillary: 170 mg/dL — ABNORMAL HIGH (ref 70–99)

## 2022-04-20 NOTE — Progress Notes (Signed)
Physical Therapy Treatment Patient Details Name: Tanya Juarez MRN: [id number] DOB: Aug 16, 1946 Today's Date: 04/20/2022   History of Present Illness 76 y.o. female with medical history significant of HTN, HLD, DM2, morbid obesity, breast CA/colon CA.  Pt admitted for left leg swelling and cellulitis after she stated she fell at home 2 weeks ago and injured her left leg.    PT Comments    The patient  is eager to try to stand. Patient seated in recliner, stated she stood from elevated bed and could step to recliner.  Patient  placed in front of be rail  to use to power up. Patient unable to clear  recliner seat. Patient uses a Risk manager  at.  Patient plans to go to rehab.   Recommendations for follow up therapy are one component of a multi-disciplinary discharge planning process, led by the attending physician.  Recommendations may be updated based on patient status, additional functional criteria and insurance authorization.  Follow Up Recommendations  Skilled nursing-short term rehab (<3 hours/day) Can patient physically be transported by private vehicle: No   Assistance Recommended at Discharge Frequent or constant Supervision/Assistance  Patient can return home with the following Two people to help with walking and/or transfers;A lot of help with bathing/dressing/bathroom;Assist for transportation;Assistance with cooking/housework   Equipment Recommendations  None recommended by PT    Recommendations for Other Services       Precautions / Restrictions Precautions Precautions: Fall     Mobility  Bed Mobility               General bed mobility comments: in recliner    Transfers Overall transfer level: Needs assistance   Transfers: Sit to/from Stand             General transfer comment: attempted x 4 to rise and power up from low recliner, pulling up onto bed rail. Patient barely clears buttocks    Ambulation/Gait                   Stairs              Wheelchair Mobility    Modified Rankin (Stroke Patients Only)       Balance                                            Cognition Arousal/Alertness: Awake/alert Behavior During Therapy: WFL for tasks assessed/performed                                   General Comments: motivated to  try to stand        Exercises General Exercises - Upper Extremity Shoulder Flexion: AROM, Both, 10 reps Shoulder Extension: AROM, Both, 10 reps Shoulder ABduction: AROM, Both, 10 reps General Exercises - Lower Extremity Ankle Circles/Pumps: AROM, Both, 15 reps Long Arc Quad: AROM, Both, 10 reps Hip ABduction/ADduction: AROM, Both, 10 reps Straight Leg Raises: AAROM, Both, 10 reps Hip Flexion/Marching: AROM, Both, 10 reps    General Comments        Pertinent Vitals/Pain Pain Assessment Pain Assessment: No/denies pain    Home Living                          Prior Function  PT Goals (current goals can now be found in the care plan section) Progress towards PT goals: Progressing toward goals    Frequency    Min 2X/week      PT Plan Current plan remains appropriate    Co-evaluation              AM-PAC PT "6 Clicks" Mobility   Outcome Measure  Help needed turning from your back to your side while in a flat bed without using bedrails?: A Lot Help needed moving from lying on your back to sitting on the side of a flat bed without using bedrails?: A Lot Help needed moving to and from a bed to a chair (including a wheelchair)?: Total Help needed standing up from a chair using your arms (e.g., wheelchair or bedside chair)?: Total Help needed to walk in hospital room?: Total Help needed climbing 3-5 steps with a railing? : Total 6 Click Score: 8    End of Session Equipment Utilized During Treatment: Gait belt Activity Tolerance: Patient tolerated treatment well Patient left: in chair;with call  bell/phone within reach Nurse Communication: Mobility status;Need for lift equipment (may need maximove) PT Visit Diagnosis: Muscle weakness (generalized) (M62.81);Adult, failure to thrive (R62.7);History of falling (Z91.81)     Time: 0802-2336 PT Time Calculation (min) (ACUTE ONLY): 24 min  Charges:  $Therapeutic Exercise: 8-22 mins $Therapeutic Activity: 8-22 mins                     [place] Office [phone] Weekend [phone]    Claretha Cooper 04/20/2022, 3:46 PM

## 2022-04-20 NOTE — Progress Notes (Signed)
PROGRESS NOTE    Tanya Juarez  DHR:[id number] DOB: 08-12-1946 DOA: 04/16/2022 PCP: Margarito Courser, MD   Brief Narrative:  HPI: Tanya Juarez is a 76 y.o. female with medical history significant of HTN, HLD, DM2, morbid obesity, breast CA/colon CA. Presenting with left leg swelling. She fell at home 2 weeks ago and injured her left leg. She says she was walking between rooms in her house and her legs just gave out. She was already following with a wound care specialist for another leg wound. They examined her and gave her instructions for care. She says she was doing fine until the last week or so. She says it seems more red and swollen. Her family sent pictures to the wound car doctor 2 days ago and they recommended that she go to the ED for evaluation. She denies any other aggravating or alleviating factors.    Assessment & Plan:   Principal Problem:   Cellulitis Active Problems:   Cancer of central portion of left female breast ([place])   DM2 (diabetes mellitus, type 2) (HCC)   HTN (hypertension)   Obesity, Class III, BMI 40-49.9 (morbid obesity) (HCC)   HLD (hyperlipidemia)   Hypokalemia  Left lower extremity cellulitis: Cellulitis has almost resolved.  Doppler lower extremity ruled out DVT.  Continue current antibiotics.  Type 2 diabetes mellitus: Does not appear to be on any medications at home for this, recent hemoglobin A1c on 12/10/2021 was 6.7.  Continue SSI.  Essential hypertension: Blood pressure controlled.  Continue home regimen.  Morbid obesity: Dietary modification as well as weight loss counseled.  Hyperlipidemia: Continue statin.  Hypokalemia: Resolved.  History of colon and breast CA: Outpatient follow-up with concerned physicians.  Generalized weakness: Seen by PT and they recommend SNF.  TOC working on it.  DVT prophylaxis: enoxaparin (LOVENOX) injection 40 mg Start: 04/17/22 1200   Code Status: Full Code  Family Communication:  None present at bedside.  Plan  of care discussed with patient in length and he/she verbalized understanding and agreed with it.  Status is: Inpatient Remains inpatient appropriate because: Medically stable, needs placement to SNF.   Estimated body mass index is 40.13 kg/m as calculated from the following:   Height as of this encounter: '5\' 5"'$  (1.651 m).   Weight as of this encounter: 109.4 kg.    Nutritional Assessment: Body mass index is 40.13 kg/m.Marland Kitchen Seen by dietician.  I agree with the assessment and plan as outlined below: Nutrition Status:        . Skin Assessment: I have examined the patient's skin and I agree with the wound assessment as performed by the wound care RN as outlined below:    Consultants:  None  Procedures:  None  Antimicrobials:  Anti-infectives (From admission, onward)    Start     Dose/Rate Route Frequency Ordered Stop   04/17/22 1045  ceFAZolin (ANCEF) IVPB 2g/100 mL premix        2 g 200 mL/hr over 30 Minutes Intravenous Every 8 hours 04/17/22 0949     04/16/22 2245  vancomycin (VANCOCIN) IVPB 1000 mg/200 mL premix        1,000 mg 200 mL/hr over 60 Minutes Intravenous  Once 04/16/22 2233 04/17/22 0121   04/16/22 2245  ceFEPIme (MAXIPIME) 2 g in sodium chloride 0.9 % 100 mL IVPB        2 g 200 mL/hr over 30 Minutes Intravenous  Once 04/16/22 2233 04/17/22 0014         Subjective:  Seen and examined.  No complaints.  She is very happy and says that " I have not seen my legs looking that good in years"  Objective: Vitals:   04/20/22 0519 04/20/22 0555 04/20/22 1020 04/20/22 1227  BP: (!) 176/79 (!) 160/62 (!) 174/80 (!) 177/73  Pulse: 68     Resp: 20     Temp: (!) 97.5 F (36.4 C) 97.7 F (36.5 C)    TempSrc: Oral Oral    SpO2: 95%     Weight:      Height:        Intake/Output Summary (Last 24 hours) at 04/20/2022 1259 Last data filed at 04/20/2022 1001 Gross per 24 hour  Intake 720 ml  Output 600 ml  Net 120 ml    Filed Weights   04/17/22 0649   Weight: 109.4 kg    Examination:  General exam: Appears calm and comfortable  Respiratory system: Clear to auscultation. Respiratory effort normal. Cardiovascular system: S1 & S2 heard, RRR. No JVD, murmurs, rubs, gallops or clicks. No pedal edema. Gastrointestinal system: Abdomen is nondistended, soft and nontender. No organomegaly or masses felt. Normal bowel sounds heard. Central nervous system: Alert and oriented. No focal neurological deficits. Extremities: Symmetric 5 x 5 power. Skin: No rashes, lesions or ulcers.  Psychiatry: Judgement and insight appear normal. Mood & affect appropriate.   Data Reviewed: I have personally reviewed following labs and imaging studies  CBC: Recent Labs  Lab 04/16/22 2336 04/17/22 0952 04/18/22 0427 04/19/22 0349  WBC 12.5* 12.0* 11.0* 10.4  NEUTROABS 10.5*  --   --  7.4  HGB 12.2 11.6* 9.9* 9.9*  HCT 38.1 37.9 31.7* 32.9*  MCV 97.7 100.3* 98.8 101.2*  PLT 210 193 177 585    Basic Metabolic Panel: Recent Labs  Lab 04/16/22 2336 04/17/22 0852 04/17/22 0952 04/18/22 0427 04/19/22 0349  NA 143  --   --  142 142  K 3.0*  --   --  3.7 4.5  CL 107  --   --  111 112*  CO2 26  --   --  26 27  GLUCOSE 120*  --   --  137* 158*  BUN 24*  --   --  22 23  CREATININE 0.80  --  0.77 0.73 0.78  CALCIUM 9.1  --   --  7.9* 8.4*  MG  --  2.0  --   --   --     GFR: Estimated Creatinine Clearance: 76 mL/min (by C-G formula based on SCr of 0.78 mg/dL). Liver Function Tests: Recent Labs  Lab 04/16/22 2336 04/18/22 0427  AST 8* 10*  ALT 13 13  ALKPHOS 50 42  BILITOT 0.9 0.6  PROT 6.3* 4.9*  ALBUMIN 3.4* 2.1*    No results for input(s): "LIPASE", "AMYLASE" in the last 168 hours. No results for input(s): "AMMONIA" in the last 168 hours. Coagulation Profile: Recent Labs  Lab 04/16/22 2336  INR 1.1    Cardiac Enzymes: No results for input(s): "CKTOTAL", "CKMB", "CKMBINDEX", "TROPONINI" in the last 168 hours. BNP (last 3  results) No results for input(s): "PROBNP" in the last 8760 hours. HbA1C: No results for input(s): "HGBA1C" in the last 72 hours.  CBG: Recent Labs  Lab 04/19/22 1154 04/19/22 1743 04/19/22 2114 04/20/22 0720 04/20/22 1113  GLUCAP 128* 120* 138* 119* 146*    Lipid Profile: No results for input(s): "CHOL", "HDL", "LDLCALC", "TRIG", "CHOLHDL", "LDLDIRECT" in the last 72 hours. Thyroid Function Tests: No results  for input(s): "TSH", "T4TOTAL", "FREET4", "T3FREE", "THYROIDAB" in the last 72 hours. Anemia Panel: No results for input(s): "VITAMINB12", "FOLATE", "FERRITIN", "TIBC", "IRON", "RETICCTPCT" in the last 72 hours. Sepsis Labs: Recent Labs  Lab 04/16/22 2336  LATICACIDVEN 1.1     Recent Results (from the past 240 hour(s))  Blood culture (routine x 2)     Status: None (Preliminary result)   Collection Time: 04/16/22 11:36 PM   Specimen: BLOOD  Result Value Ref Range Status   Specimen Description   Final    BLOOD RIGHT ANTECUBITAL Performed at [facility], [address], [place], [place] [address]    Special Requests   Final    BOTTLES DRAWN AEROBIC AND ANAEROBIC Blood Culture adequate volume Performed at [facility], [address], [place], [place] [address]    Culture   Final    NO GROWTH 3 DAYS Performed at [facility], [place] [address], [place], [place] [address]    Report Status PENDING  Incomplete  Blood culture (routine x 2)     Status: None (Preliminary result)   Collection Time: 04/17/22  7:18 AM   Specimen: BLOOD LEFT HAND  Result Value Ref Range Status   Specimen Description BLOOD LEFT HAND  Final   Special Requests IN PEDIATRIC BOTTLE Blood Culture adequate volume  Final   Culture   Final    NO GROWTH 3 DAYS Performed at [facility], [place] [address], [place], [place] [address]    Report Status PENDING  Incomplete     Radiology Studies: No results found.  Scheduled Meds:  enoxaparin  (LOVENOX) injection  40 mg Subcutaneous Q24H   ferrous sulfate  325 mg Oral Q1500   irbesartan  300 mg Oral Daily   And   hydrochlorothiazide  12.5 mg Oral Daily   insulin aspart  0-9 Units Subcutaneous TID WC   pravastatin  20 mg Oral q1800   spironolactone  25 mg Oral Daily   Continuous Infusions:   ceFAZolin (ANCEF) IV 2 g (04/20/22 1026)     LOS: 3 days   Darliss Cheney, MD [facility]  04/20/2022, 12:59 PM   *Please note that this is a verbal dictation therefore any spelling or grammatical errors are due to the "[place] [facility]" system interpretation.  Please page via [place] and do not message via secure chat for urgent patient care matters. Secure chat can be used for non urgent patient care matters.  How to contact the [facility] Attending or Consulting provider [place] or covering provider during after hours [place], for this patient?  Check the care team in [facility] and look for a) attending/consulting TRH provider listed and b) the [facility] team listed. Page or secure chat [phone]. Log into [email or web address] and use [place]'s universal password to access. If you do not have the password, please contact the hospital operator. Locate the [facility] provider you are looking for under [facility] and page to a number that you can be directly reached. If you still have difficulty reaching the provider, please page the [facility] (Director on Call) for the Hospitalists listed on amion for assistance.

## 2022-04-20 NOTE — TOC Progression Note (Addendum)
Transition of Care [facility]) - Progression Note    Patient Details  Name: Tanya Juarez MRN: [id number] Date of Birth: 1946/06/18  Transition of [facility] CM/SW Contact  Leeroy Cha, RN Phone Number: 04/20/2022, 11:05 AM  Clinical Narrative:    Spoke with patient and son about bed offers. Son request that these be emailed to him.  Will send a secure email to [email or web address] Son called back want to use [facility]. Will start auth.  Expected Discharge Plan: Skilled Nursing Facility Barriers to Discharge: Continued Medical Work up, Ship broker  Expected Discharge Plan and Services Expected Discharge Plan: [place]                                               Social Determinants of Health (SDOH) Interventions    Readmission Risk Interventions     No data to display

## 2022-04-21 DIAGNOSIS — E785 Hyperlipidemia, unspecified: Secondary | ICD-10-CM

## 2022-04-21 DIAGNOSIS — I10 Essential (primary) hypertension: Secondary | ICD-10-CM

## 2022-04-21 DIAGNOSIS — E119 Type 2 diabetes mellitus without complications: Secondary | ICD-10-CM

## 2022-04-21 LAB — GLUCOSE, CAPILLARY
Glucose-Capillary: 135 mg/dL — ABNORMAL HIGH (ref 70–99)
Glucose-Capillary: 127 mg/dL — ABNORMAL HIGH (ref 70–99)

## 2022-04-21 MED ORDER — CEPHALEXIN 500 MG PO CAPS: 500.0000 mg | ORAL_CAPSULE | Freq: Three times a day (TID) | ORAL | 0 refills | Status: AC

## 2022-04-21 NOTE — Progress Notes (Signed)
Report given to [facility]. All questions answered.

## 2022-04-21 NOTE — Progress Notes (Signed)
Occupational Therapy Treatment Patient Details Name: Tanya Juarez MRN: [id number] DOB: 07/16/1946 Today's Date: 04/21/2022   History of present illness 76 y.o. female with medical history significant of HTN, HLD, DM2, morbid obesity, breast CA/colon CA.  Pt admitted for left leg swelling and cellulitis after she stated she fell at home 2 weeks ago and injured her left leg.   OT comments  Treatment focused on working on sit to stand with use of stedy to advance Adls to out of bed. . Patient stood x 3- needing mod x 2 to stand but patient working on using upper body strength to assist. Is able to stand while holding bar each time. Continue POC.   Recommendations for follow up therapy are one component of a multi-disciplinary discharge planning process, led by the attending physician.  Recommendations may be updated based on patient status, additional functional criteria and insurance authorization.    Follow Up Recommendations  Skilled nursing-short term rehab (<3 hours/day)    Assistance Recommended at Discharge    Patient can return home with the following  A lot of help with bathing/dressing/bathroom;Assistance with cooking/housework;Direct supervision/assist for financial management;Assist for transportation;Help with stairs or ramp for entrance;Two people to help with walking and/or transfers;Direct supervision/assist for medications management   Equipment Recommendations  None recommended by OT (defer)    Recommendations for Other Services      Precautions / Restrictions Precautions Precautions: Fall Restrictions Weight Bearing Restrictions: No       Mobility Bed Mobility Overal bed mobility: Needs Assistance Bed Mobility: Supine to Sit     Supine to sit: Min assist, HOB elevated     General bed mobility comments: Min assist to come to edge of the bed    Transfers Overall transfer level: Needs assistance   Transfers: Sit to/from Stand Sit to Stand: Mod assist,  From elevated surface, +2 physical assistance           General transfer comment: worked on sit to stand with use of stedy. mod x 2 to stand 3 times. able to stand  > 30 seconds each time. Fatigued by last stand. Reported discomfort in shins due to legs being pressed against shin block.     Balance Overall balance assessment: Needs assistance Sitting-balance support: No upper extremity supported, Feet supported Sitting balance-Leahy Scale: Good     Standing balance support: During functional activity, Reliant on assistive device for balance Standing balance-Leahy Scale: Poor                             ADL either performed or assessed with clinical judgement   ADL                                              Extremity/Trunk Assessment Upper Extremity Assessment Upper Extremity Assessment: Overall WFL for tasks assessed   Lower Extremity Assessment Lower Extremity Assessment: Defer to PT evaluation   Cervical / Trunk Assessment Cervical / Trunk Assessment: Other exceptions Cervical / Trunk Exceptions: increased body habitus with muscular atrophy of extremities    Vision Patient Visual Report: No change from baseline     Perception     Praxis      Cognition Arousal/Alertness: Awake/alert Behavior During Therapy: WFL for tasks assessed/performed Overall Cognitive Status: Within Functional Limits for tasks assessed  Exercises      Shoulder Instructions       General Comments      Pertinent Vitals/ Pain       Pain Assessment Pain Assessment: Faces Pain Location: LEs against stedy Pain Descriptors / Indicators: Sore, Tender Pain Intervention(s): Limited activity within patient's tolerance, Monitored during session  Home Living                                          Prior Functioning/Environment              Frequency  Min 2X/week         Progress Toward Goals  OT Goals(current goals can now be found in the care plan section)  Progress towards OT goals: Progressing toward goals  Acute Rehab OT Goals Patient Stated Goal: to stand from any surface OT Goal Formulation: With patient Time For Goal Achievement: 05/02/22 Potential to Achieve Goals: [place] Discharge plan remains appropriate    Co-evaluation                 AM-PAC OT "6 Clicks" Daily Activity     Outcome Measure   Help from another person eating meals?: None Help from another person taking care of personal grooming?: A Little Help from another person toileting, which includes using toliet, bedpan, or urinal?: A Lot Help from another person bathing (including washing, rinsing, drying)?: A Lot Help from another person to put on and taking off regular upper body clothing?: A Little Help from another person to put on and taking off regular lower body clothing?: A Lot 6 Click Score: 16    End of Session Equipment Utilized During Treatment: Gait belt  OT Visit Diagnosis: Unsteadiness on feet (R26.81);Other abnormalities of gait and mobility (R26.89);Muscle weakness (generalized) (M62.81);History of falling (Z91.81)   Activity Tolerance Patient tolerated treatment well   Patient Left in bed;with call bell/phone within reach;with bed alarm set   Nurse Communication Mobility status        Time: [phone] OT Time Calculation (min): 24 min  Charges: OT General Charges $OT Visit: 1 Visit OT Treatments $Therapeutic Activity: 23-37 mins  Dameshia Seybold, OTR/L [place]  Office [phone] Pager: [place] 04/21/2022, 12:20 PM

## 2022-04-21 NOTE — Discharge Summary (Signed)
Physician Discharge Summary  Tanya Juarez JGG:[id number] DOB: 1946/08/29 DOA: 04/16/2022  PCP: Margarito Courser, MD  Admit date: 04/16/2022 Discharge date: 04/21/2022  Admitted From: Home Disposition: SNF  Recommendations for Outpatient Follow-up:  Follow up with SNF provider at earliest convenience with repeat CBC/BMP Follow up in ED if symptoms worsen or new appear   Home Health: No Equipment/Devices: None  Discharge Condition: Stable CODE STATUS: Full Diet recommendation: Heart healthy/carb modified  Brief/Interim Summary: 76 y.o. female with medical history significant of HTN, HLD, DM2, morbid obesity, breast CA/colon CA presented with worsening left leg swelling after a recent fall at home 2 weeks ago with left leg injury.  She was admitted for left lower extremity cellulitis and treated with IV Ancef with marked improvement in her cellulitis.  PT recommended SNF placement.  She will be discharged to SNF once bed is available on oral Keflex.  Discharge Diagnoses:   Left lower extremity cellulitis -Lower extremity duplex was negative for DVT.  Treated with IV Ancef and cellulitis has almost resolved.  Continue Keflex for 3 more days on discharge.  Diabetes mellitus type 2 -Most recent A1c was 6.7 on 12/10/2021.  Current modified diet.  Outpatient follow-up.  Continue home regimen  Morbid obesity -Outpatient follow-up  Essential hypertension -Continue spironolactone, irbesartan-hydrochlorothiazide.  Unsure if patient is taking Coreg.  Coreg has been on hold during the hospitalization.  We will keep Coreg on hold.  Outpatient follow-up with PCP  Leukocytosis -Resolved  Hyperlipidemia -Continue statin  History of: Breast cancer -Outpatient follow-up with oncology  Generalized weakness -We will need PT [facility] at [facility]   Discharge Instructions  Discharge Instructions     Diet - low sodium heart healthy   Complete by: As directed    Increase activity slowly    Complete by: As directed       Allergies as of 04/21/2022       Reactions   Amlodipine Swelling    edema        Medication List     STOP taking these medications    bacitracin ointment   carvedilol 25 MG tablet Commonly known as: COREG   Santyl 250 UNIT/GM ointment Generic drug: collagenase       TAKE these medications    acetaminophen 650 MG CR tablet Commonly known as: TYLENOL Take 650 mg by mouth 2 (two) times daily as needed for pain.   cephALEXin 500 MG capsule Commonly known as: KEFLEX Take 1 capsule (500 mg total) by mouth 3 (three) times daily for 3 days. What changed: when to take this   ferrous sulfate 325 (65 FE) MG tablet Take 325 mg by mouth daily in the afternoon.   irbesartan-hydrochlorothiazide 300-12.5 MG tablet Commonly known as: AVALIDE Take 1 tablet by mouth daily.   lidocaine 4 % Place 1 patch onto the skin as needed (pain).   metFORMIN 1000 MG tablet Commonly known as: GLUCOPHAGE Take 500 mg by mouth daily as needed (high blood sugar).   pravastatin 20 MG tablet Commonly known as: PRAVACHOL Take 20 mg by mouth daily.   spironolactone 25 MG tablet Commonly known as: ALDACTONE Take 1 tablet (25 mg total) by mouth daily.   VITAMIN D PO Take 1 tablet by mouth daily.        Allergies  Allergen Reactions   Amlodipine Swelling     edema    Consultations: None   Procedures/Studies: VAS [place] LOWER EXTREMITY VENOUS (DVT)  Result Date: 04/19/2022  Lower Venous DVT Study  Patient Name:  Tanya Juarez  Date of Exam:   04/18/2022 Medical Rec #: [id number]       Accession #:    [id number] Date of Birth: March 13, 1946       Patient Gender: F Patient Age:   76 years Exam Location:  [facility] Procedure:      VAS [place] LOWER EXTREMITY VENOUS (DVT) Referring Phys: RAVI PAHWANI --------------------------------------------------------------------------------  Indications: Swelling.  Comparison Study: No previous exam noted. Performing  Technologist: Bobetta Lime BS, RVT  Examination Guidelines: A complete evaluation includes B-mode imaging, spectral Doppler, color Doppler, and power Doppler as needed of all accessible portions of each vessel. Bilateral testing is considered an integral part of a complete examination. Limited examinations for reoccurring indications may be performed as noted. The reflux portion of the exam is performed with the patient in reverse Trendelenburg.  +---------+---------------+---------+-----------+----------+--------------+ RIGHT    CompressibilityPhasicitySpontaneityPropertiesThrombus Aging +---------+---------------+---------+-----------+----------+--------------+ CFV      Full           Yes      Yes                                 +---------+---------------+---------+-----------+----------+--------------+ SFJ      Full                                                        +---------+---------------+---------+-----------+----------+--------------+ FV Prox  Full                                                        +---------+---------------+---------+-----------+----------+--------------+ FV Mid   Full                                                        +---------+---------------+---------+-----------+----------+--------------+ FV DistalFull                                                        +---------+---------------+---------+-----------+----------+--------------+ PFV      Full                                                        +---------+---------------+---------+-----------+----------+--------------+ POP      Full           Yes      Yes                                 +---------+---------------+---------+-----------+----------+--------------+ PTV      Full                                                        +---------+---------------+---------+-----------+----------+--------------+  PERO     Full                                                         +---------+---------------+---------+-----------+----------+--------------+   +---------+---------------+---------+-----------+----------+-------------------+ LEFT     CompressibilityPhasicitySpontaneityPropertiesThrombus Aging      +---------+---------------+---------+-----------+----------+-------------------+ CFV      Full           Yes      Yes                                      +---------+---------------+---------+-----------+----------+-------------------+ SFJ      Full                                                             +---------+---------------+---------+-----------+----------+-------------------+ FV Prox  Full                                                             +---------+---------------+---------+-----------+----------+-------------------+ FV Mid   Full                                                             +---------+---------------+---------+-----------+----------+-------------------+ FV DistalFull                                                             +---------+---------------+---------+-----------+----------+-------------------+ PFV      Full                                                             +---------+---------------+---------+-----------+----------+-------------------+ POP      Full           Yes      Yes                                      +---------+---------------+---------+-----------+----------+-------------------+ PTV                                                   Not well visualized +---------+---------------+---------+-----------+----------+-------------------+ PERO  Not well visualized +---------+---------------+---------+-----------+----------+-------------------+   Left Technical Findings: The left calf veins were not visualized to due bandaging and wounds at the calf area.   Summary: BILATERAL: - No evidence  of deep vein thrombosis seen in the lower extremities, bilaterally. -No evidence of popliteal cyst, bilaterally.   *See table(s) above for measurements and observations. Electronically signed by Monica Martinez MD on 04/19/2022 at 10:16:27 AM.    Final    CT EXTREMITY LOWER LEFT W CONTRAST  Result Date: 04/17/2022 CLINICAL DATA:  Soft tissue infection suspected, lower leg. Concern for [employer] injury. EXAM: CT OF THE LOWER LEFT EXTREMITY WITH CONTRAST TECHNIQUE: Multidetector CT imaging of the lower left extremity was performed according to the standard protocol following intravenous contrast administration. RADIATION DOSE REDUCTION: This exam was performed according to the departmental dose-optimization program which includes automated exposure control, adjustment of the mA and/or kV according to patient size and/or use of iterative reconstruction technique. CONTRAST:  148m OMNIPAQUE IOHEXOL 300 MG/ML  SOLN COMPARISON:  None Available. FINDINGS: Bones/Joint/Cartilage No acute fracture or dislocation. Fixation hardware is noted at the distal fibula. Mild-to-moderate degenerative changes are noted in the medial and patellofemoral compartments at the knee. Calcaneal spurring is noted. Multiple bony densities are seen inferior to the medial malleolus, possible old avulsion fractures. A small joint effusion is noted at the knee. Ligaments Suboptimally assessed by CT. Muscles and Tendons Diffuse muscular atrophy is noted. No intramuscular hematoma. The quadriceps and patellar tendons are intact. Soft tissues There is diffuse subcutaneous fat stranding and edema about the right lower extremity. Scattered irregular hyperdense foci are noted in the subcutaneous soft tissues along the posteromedial aspect of the calf suggesting small multifocal hematomas. No abnormal enhancement is seen. No encapsulated fluid collection is seen. Vascular calcifications are present in the soft tissues. IMPRESSION: 1. Diffuse  subcutaneous edema and fat stranding in the right lower extremity. No encapsulated fluid collection or abnormal enhancement is seen. 2. Scattered hyperdense regions in the subcutaneous tissues overlying the posteromedial calf, possible small hemorrhages. 3. No acute osseous abnormality. Electronically Signed   By: LBrett FairyM.D.   On: 04/17/2022 01:44      Subjective: Patient seen and examined at bedside.  Denies worsening shortness of breath, chest pain, nausea or vomiting.  Discharge Exam: Vitals:   04/21/22 0505 04/21/22 0900  BP: (!) 151/77 (!) 194/88  Pulse: 66 74  Resp: 18 18  Temp: 98.5 F (36.9 C) 98.2 F (36.8 C)  SpO2: 97% 98%    General: Pt is alert, awake, not in acute distress.  Currently on room air. Cardiovascular: rate controlled, S1/S2 + Respiratory: bilateral decreased breath sounds at bases with some scattered crackles Abdominal: Soft, NT, ND, bowel sounds + Extremities: Bilateral lower extremity edema present.  Bilateral lower extremity dressing present.  No cyanosis    The results of significant diagnostics from this hospitalization (including imaging, microbiology, ancillary and laboratory) are listed below for reference.     Microbiology: Recent Results (from the past 240 hour(s))  Blood culture (routine x 2)     Status: None (Preliminary result)   Collection Time: 04/16/22 11:36 PM   Specimen: BLOOD  Result Value Ref Range Status   Specimen Description   Final    BLOOD RIGHT ANTECUBITAL Performed at [facility], [address] [place] [address]   Special Requests   Final    BOTTLES DRAWN AEROBIC AND ANAEROBIC Blood Culture adequate volume Performed at [facility],  [address] [place], [place] [address]    Culture   Final    NO GROWTH 4 DAYS Performed at [facility], [place] [address], [place], [place] [address]    Report Status PENDING  Incomplete  Blood culture (routine x 2)      Status: None (Preliminary result)   Collection Time: 04/17/22  7:18 AM   Specimen: BLOOD LEFT HAND  Result Value Ref Range Status   Specimen Description BLOOD LEFT HAND  Final   Special Requests IN PEDIATRIC BOTTLE Blood Culture adequate volume  Final   Culture   Final    NO GROWTH 4 DAYS Performed at [facility], [place] [address], [place],  [address]    Report Status PENDING  Incomplete     Labs: BNP (last 3 results) Recent Labs    11/27/21 2022  BNP 51.7   Basic Metabolic Panel: Recent Labs  Lab 04/16/22 2336 04/17/22 0852 04/17/22 0952 04/18/22 0427 04/19/22 0349  NA 143  --   --  142 142  K 3.0*  --   --  3.7 4.5  CL 107  --   --  111 112*  CO2 26  --   --  26 27  GLUCOSE 120*  --   --  137* 158*  BUN 24*  --   --  22 23  CREATININE 0.80  --  0.77 0.73 0.78  CALCIUM 9.1  --   --  7.9* 8.4*  MG  --  2.0  --   --   --    Liver Function Tests: Recent Labs  Lab 04/16/22 2336 04/18/22 0427  AST 8* 10*  ALT 13 13  ALKPHOS 50 42  BILITOT 0.9 0.6  PROT 6.3* 4.9*  ALBUMIN 3.4* 2.1*   No results for input(s): "LIPASE", "AMYLASE" in the last 168 hours. No results for input(s): "AMMONIA" in the last 168 hours. CBC: Recent Labs  Lab 04/16/22 2336 04/17/22 0952 04/18/22 0427 04/19/22 0349  WBC 12.5* 12.0* 11.0* 10.4  NEUTROABS 10.5*  --   --  7.4  HGB 12.2 11.6* 9.9* 9.9*  HCT 38.1 37.9 31.7* 32.9*  MCV 97.7 100.3* 98.8 101.2*  PLT 210 193 177 204   Cardiac Enzymes: No results for input(s): "CKTOTAL", "CKMB", "CKMBINDEX", "TROPONINI" in the last 168 hours. BNP: Invalid input(s): "POCBNP" CBG: Recent Labs  Lab 04/20/22 0720 04/20/22 1113 04/20/22 1731 04/20/22 2202 04/21/22 0755  GLUCAP 119* 146* 146* 170* 135*   D-Dimer No results for input(s): "DDIMER" in the last 72 hours. Hgb A1c No results for input(s): "HGBA1C" in the last 72 hours. Lipid Profile No results for input(s): "CHOL", "HDL", "LDLCALC", "TRIG", "CHOLHDL",  "LDLDIRECT" in the last 72 hours. Thyroid function studies No results for input(s): "TSH", "T4TOTAL", "T3FREE", "THYROIDAB" in the last 72 hours.  Invalid input(s): "FREET3" Anemia work up No results for input(s): "VITAMINB12", "FOLATE", "FERRITIN", "TIBC", "IRON", "RETICCTPCT" in the last 72 hours. Urinalysis No results found for: "COLORURINE", "APPEARANCEUR", "LABSPEC", "PHURINE", "GLUCOSEU", "HGBUR", "BILIRUBINUR", "KETONESUR", "PROTEINUR", "UROBILINOGEN", "NITRITE", "LEUKOCYTESUR" Sepsis Labs Recent Labs  Lab 04/16/22 2336 04/17/22 0952 04/18/22 0427 04/19/22 0349  WBC 12.5* 12.0* 11.0* 10.4   Microbiology Recent Results (from the past 240 hour(s))  Blood culture (routine x 2)     Status: None (Preliminary result)   Collection Time: 04/16/22 11:36 PM   Specimen: BLOOD  Result Value Ref Range Status   Specimen Description   Final    BLOOD RIGHT ANTECUBITAL Performed  at [facility], [address], [place], [place] [address]    Special Requests   Final    BOTTLES DRAWN AEROBIC AND ANAEROBIC Blood Culture adequate volume Performed at [facility], [address] [place], [place] [address]    Culture   Final    NO GROWTH 4 DAYS Performed at [facility], [place] [address] [place], [place] [address]    Report Status PENDING  Incomplete  Blood culture (routine x 2)     Status: None (Preliminary result)   Collection Time: 04/17/22  7:18 AM   Specimen: BLOOD LEFT HAND  Result Value Ref Range Status   Specimen Description BLOOD LEFT HAND  Final   Special Requests IN PEDIATRIC BOTTLE Blood Culture adequate volume  Final   Culture   Final    NO GROWTH 4 DAYS Performed at [facility], [place] [address], [place],  [address]    Report Status PENDING  Incomplete     Time coordinating discharge: 35 minutes  SIGNED:   Aline August, MD  [facility] 04/21/2022, 10:50 AM

## 2022-04-21 NOTE — TOC Progression Note (Addendum)
Transition of Care [facility]) - Progression Note    Patient Details  Name: Selia Wareing MRN: [id number] Date of Birth: 1945/12/21  Transition of Care [facility]) CM/SW Contact  Leeroy Cha, RN Phone Number: 04/21/2022, 10:44 AM  Clinical Narrative:    [facility] they are ready for patient to come.  Notified the md.  Awaiting dc summary.   Dc summary sent to [facility] via the hub.  Ptar called for transport at 1100. Rn to call [phone] for report.  Expected Discharge Plan: Skilled Nursing Facility Barriers to Discharge: Barriers Resolved  Expected Discharge Plan and Services Expected Discharge Plan: [place]                                               Social Determinants of Health (SDOH) Interventions    Readmission Risk Interventions     No data to display

## 2022-04-21 NOTE — Progress Notes (Signed)
Pt developed a 5x5cm hematoma on her left lower leg after working with therapy. RN reassessed 22mn after initial assessment and measured 4x4cm. Pt does not report pain. MD notified.

## 2022-04-22 LAB — CULTURE, BLOOD (ROUTINE X 2)
Culture: NO GROWTH
Culture: NO GROWTH
Special Requests: ADEQUATE
Special Requests: ADEQUATE

## 2023-02-23 IMAGING — CR DG TIBIA/FIBULA 2V*L*
4 series · 4 of 4 positions shown · non-contrast
Comparison: None.

CLINICAL DATA: Status post fall.

EXAM:
LEFT TIBIA AND FIBULA - 2 VIEW

[x tib-fib ap left (1 of 2)]
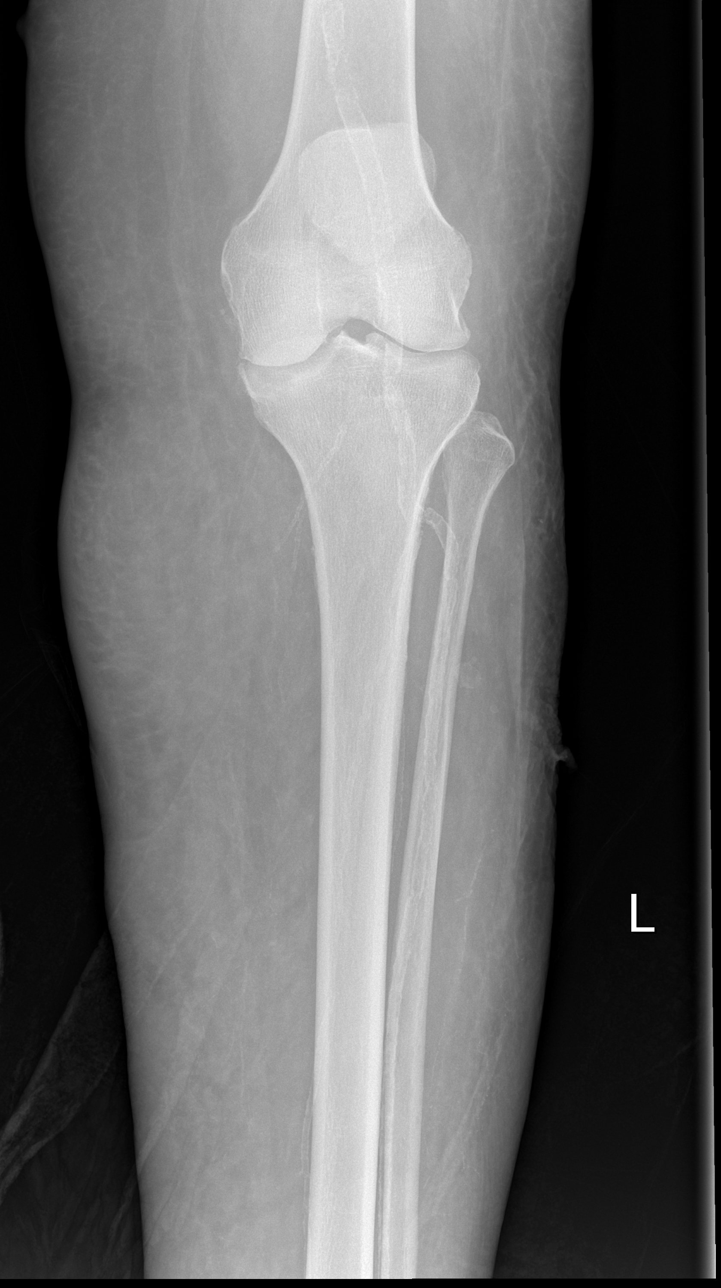

[x tib-fib ap left (2 of 2)]
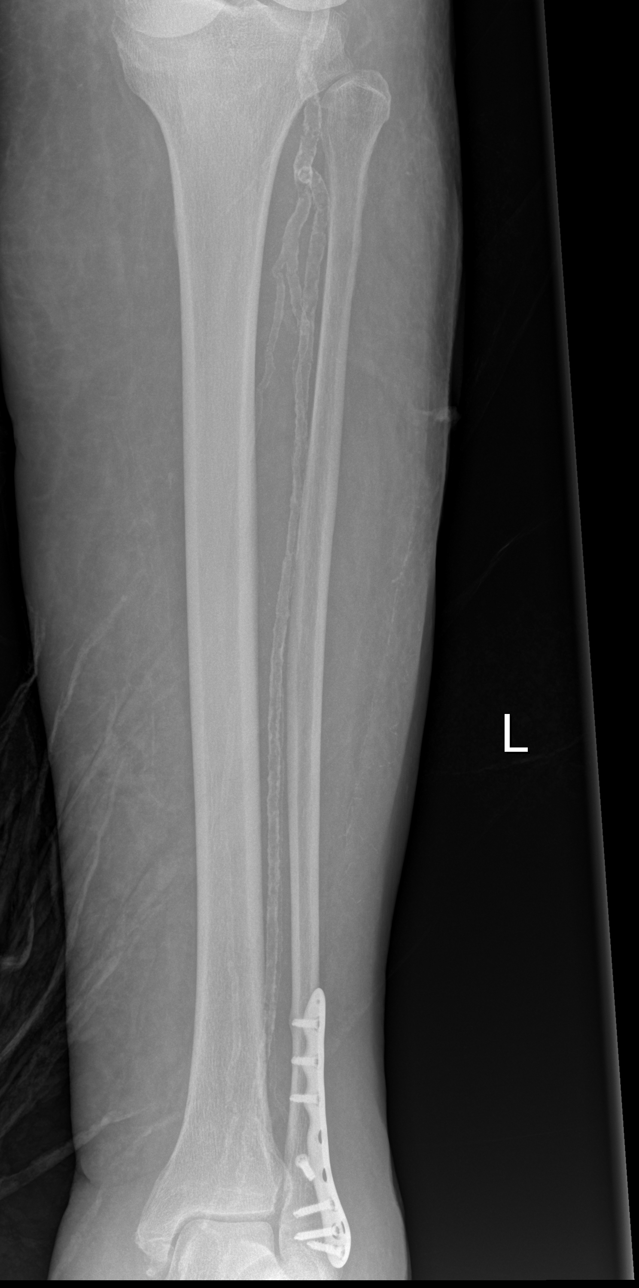

[x tib-fib lat left (1 of 2)]
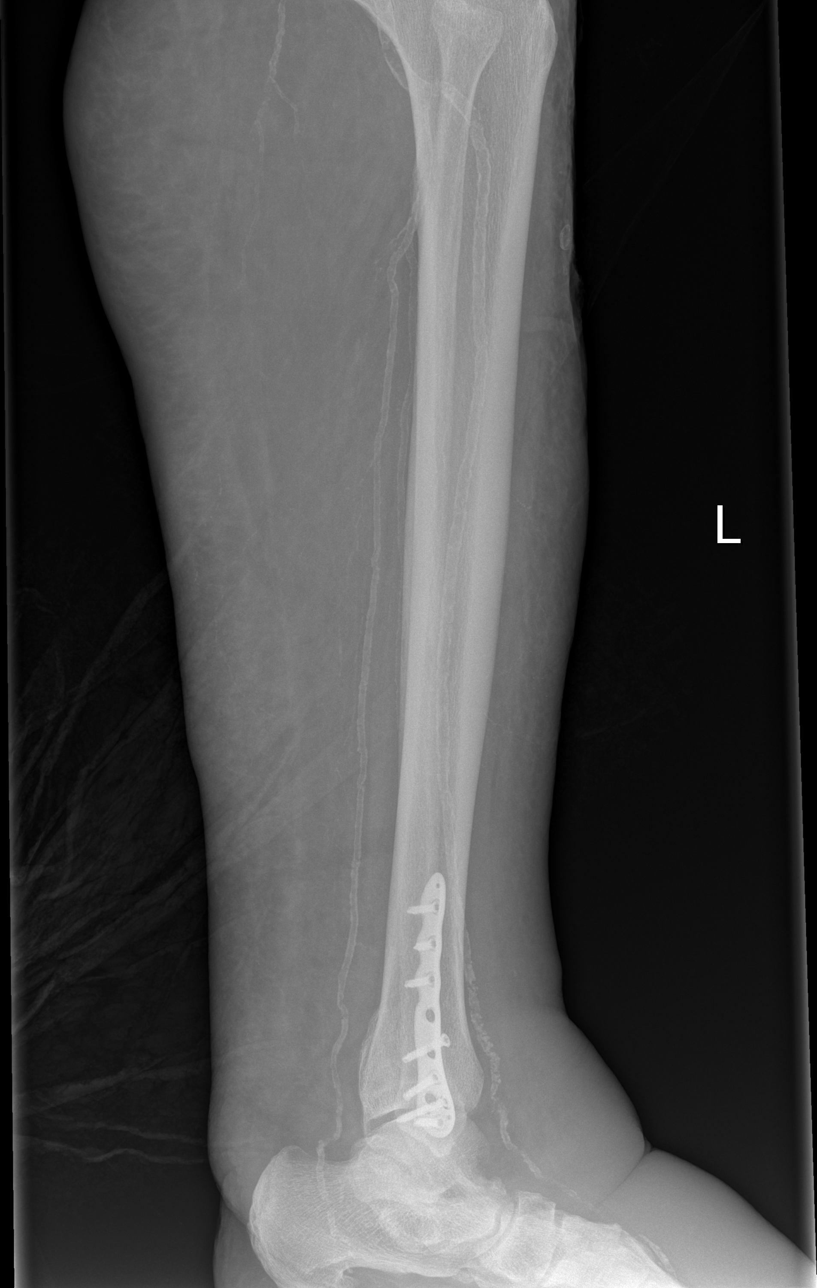

[x tib-fib lat left (2 of 2)]
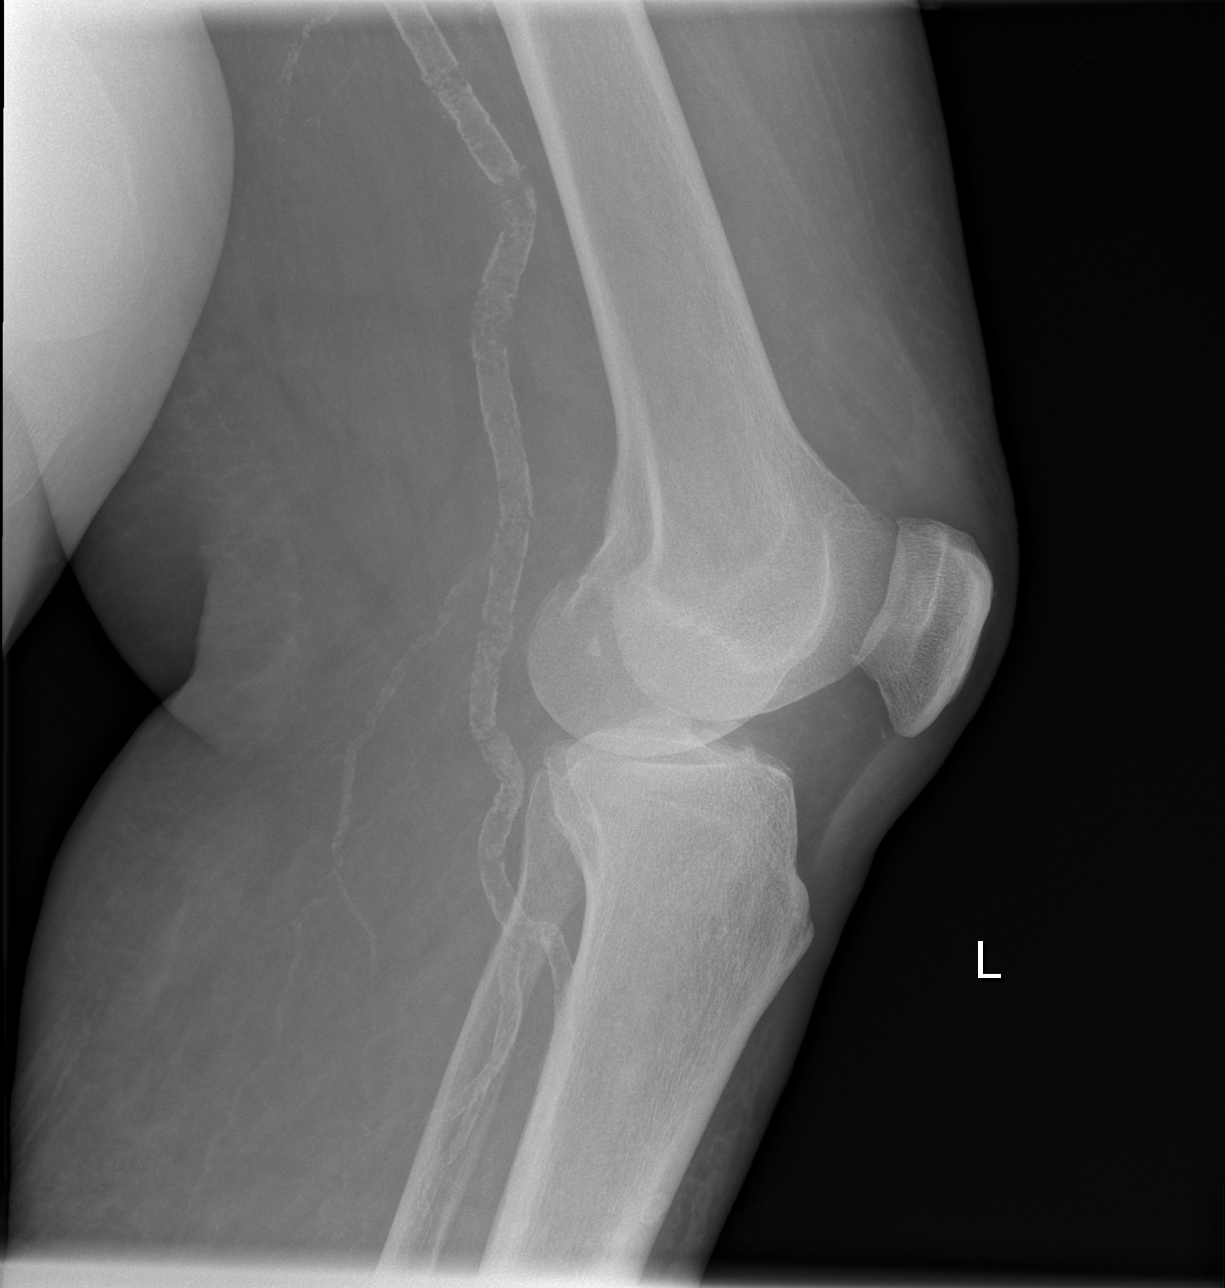

[4 of 4 positions shown; findings below may reference images not displayed]

FINDINGS: There is no evidence of an acute fracture or other focal bone
lesions. A radiopaque fixation plate and screws are seen along the
left lateral malleolus. There is marked severity vascular
calcification. Marked severity soft tissue swelling is seen along
the left ankle and visualized portion of the left foot.
IMPRESSION: 1. No acute osseous abnormality.
2. Soft tissue swelling which may be, in part, related to the
patient's body habitus.
3. Prior open reduction and internal fixation of the left lateral
malleolus.

## 2023-02-23 IMAGING — CR DG CHEST 2V
2 series · 2 of 2 positions shown · non-contrast
Comparison: None.

CLINICAL DATA: Status post fall.

EXAM:
CHEST - 2 VIEW

[x chest ap]
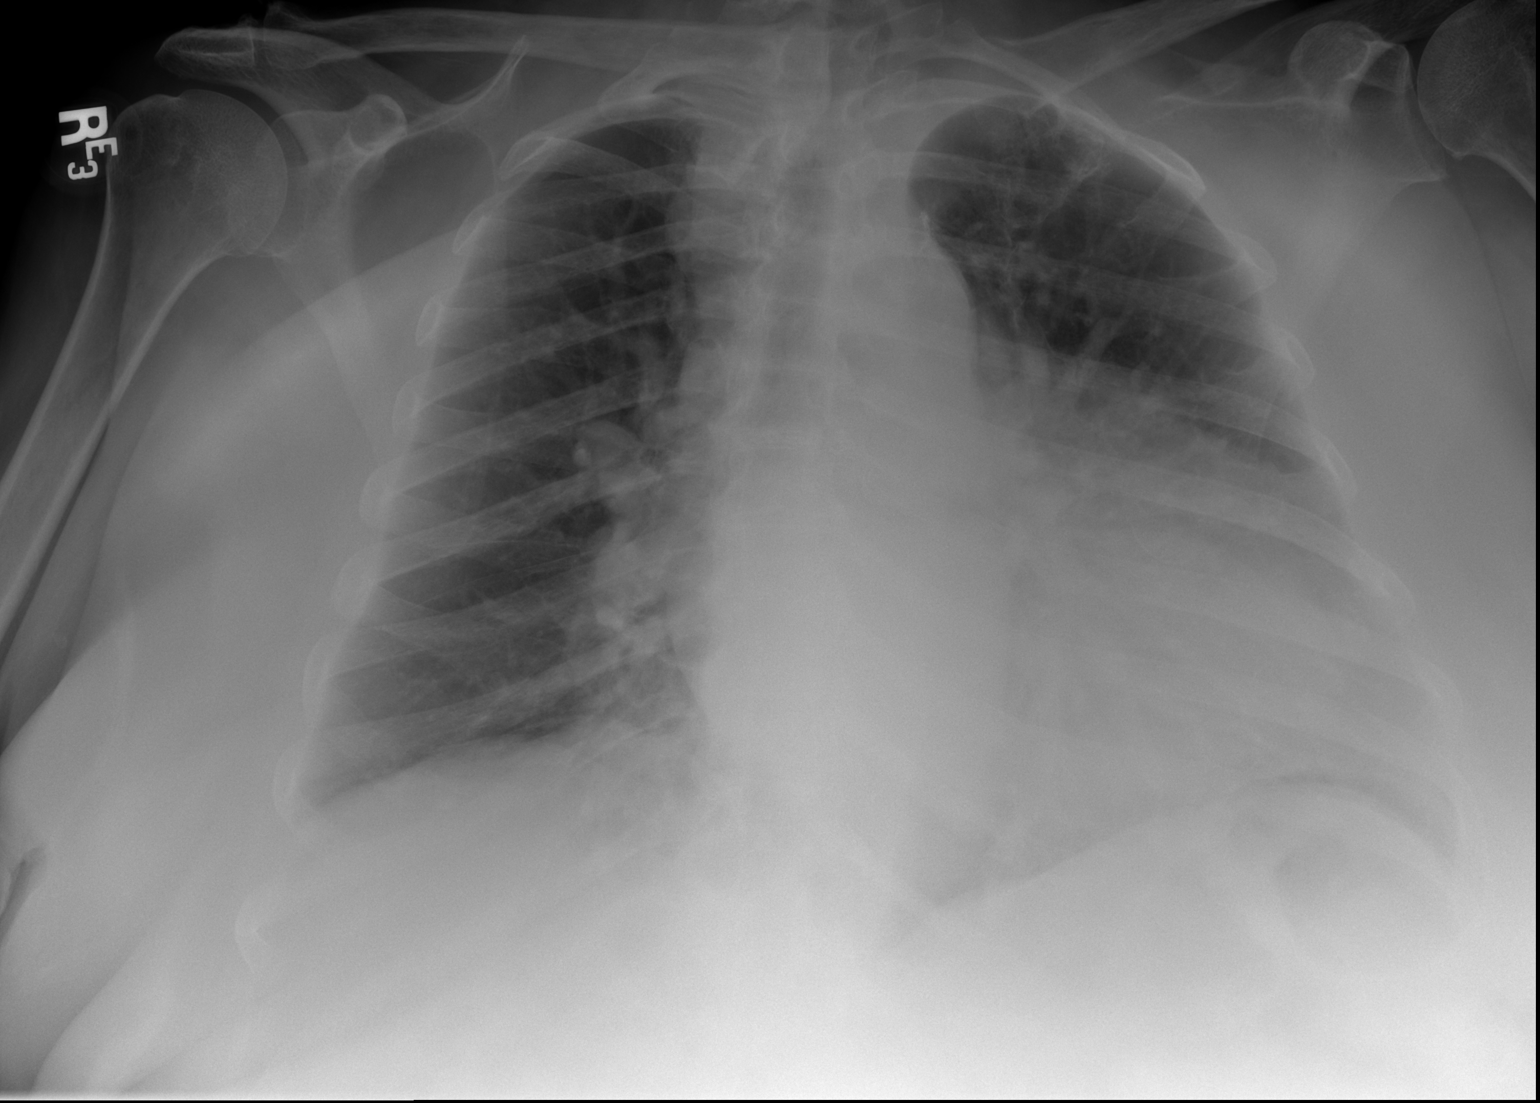

[w chest lat]
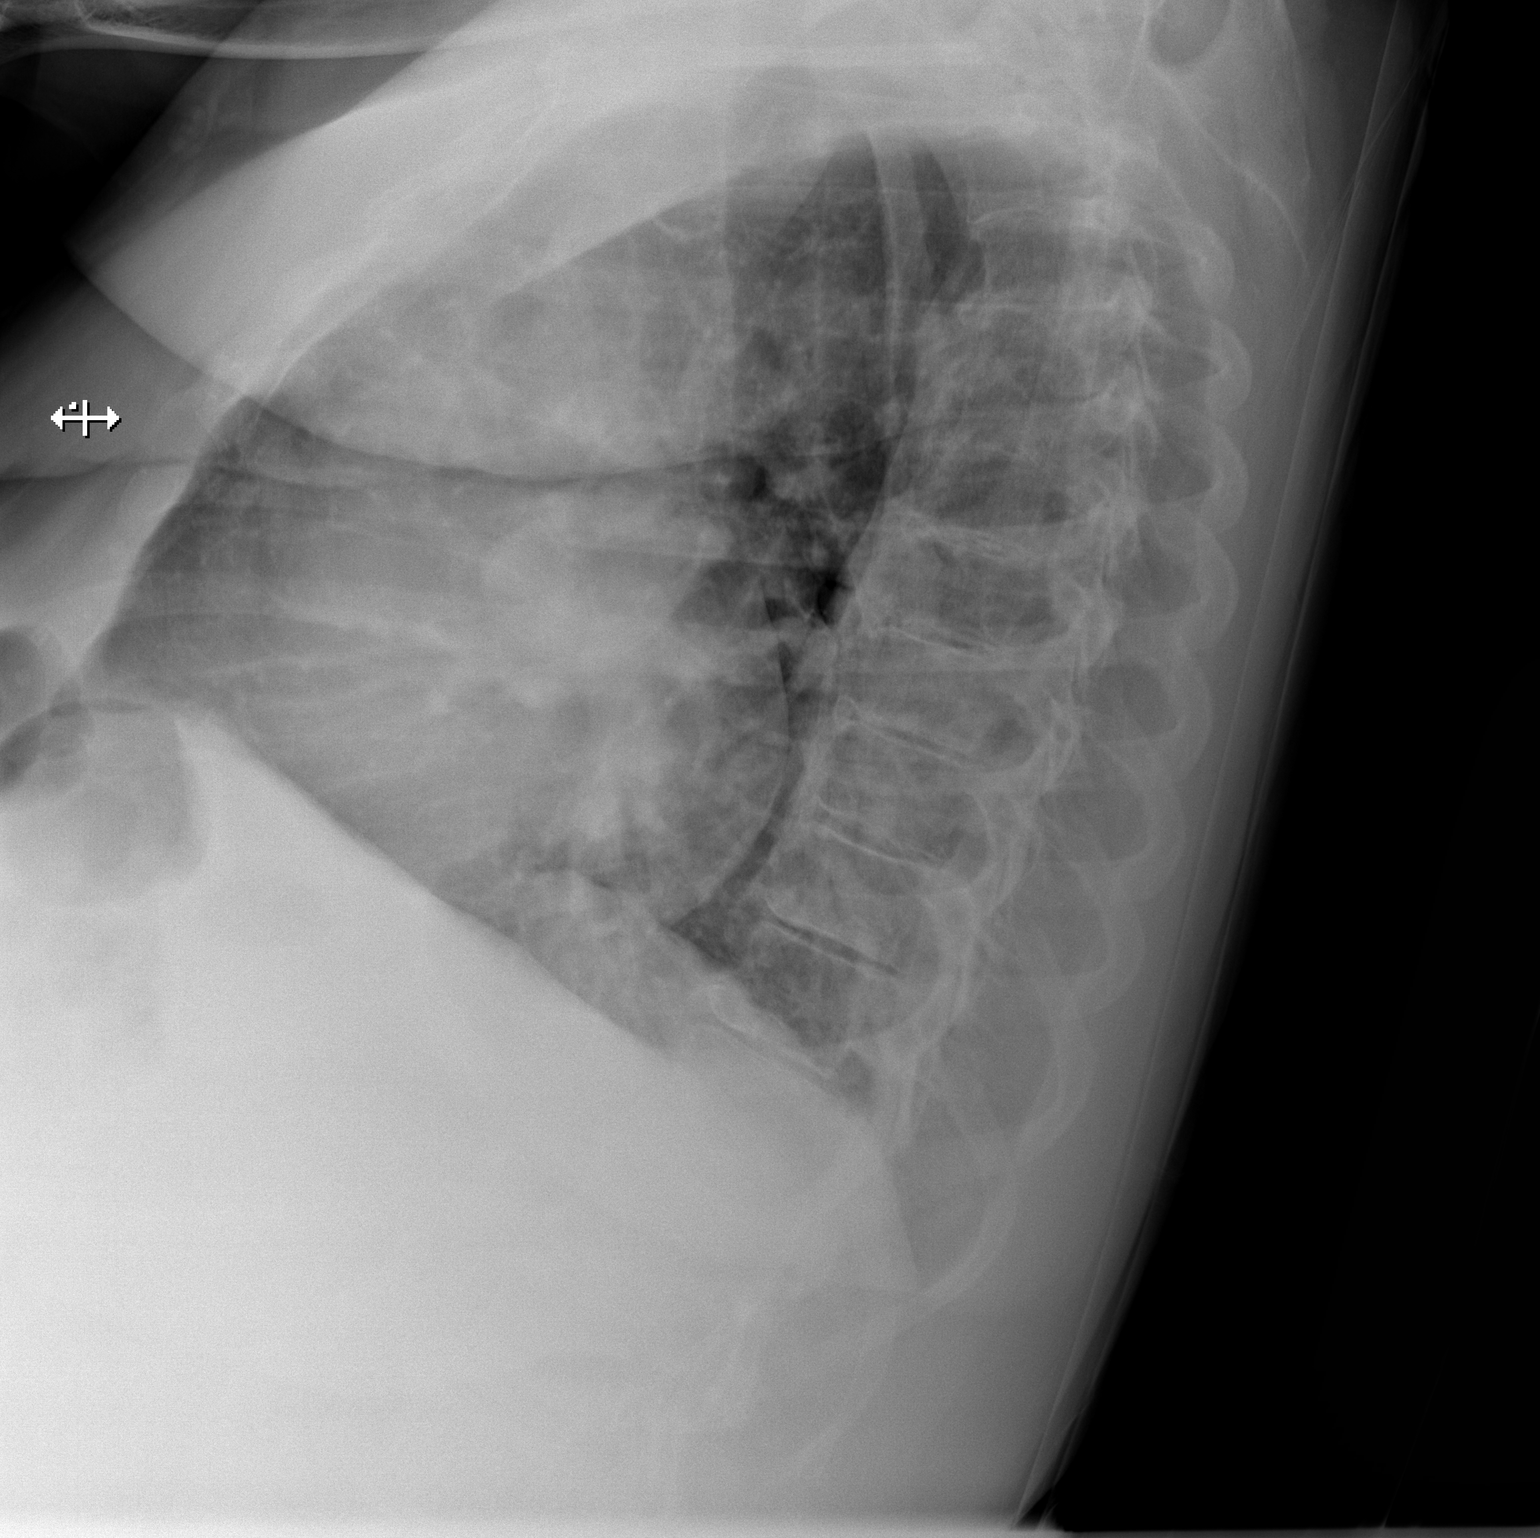

[2 of 2 positions shown; findings below may reference images not displayed]

FINDINGS: The cardiac silhouette is markedly enlarged. Mild bilateral
perihilar pulmonary vascular prominence is noted. Both lungs are
otherwise clear. No acute osseous abnormality is identified.
IMPRESSION: Marked cardiomegaly with mild bilateral perihilar pulmonary vascular
prominence, which may be chronic in nature.

## 2023-11-26 DEATH — deceased
